# Patient Record
Sex: Male | Born: 1955 | Race: Black or African American | Hispanic: No | Marital: Single | Smoking: Never smoker
Health system: Southern US, Community
[De-identification: ages and names within clinical notes are randomized; demographics above are authoritative.]

## PROBLEM LIST (undated history)

## (undated) DIAGNOSIS — I10 Essential (primary) hypertension: Secondary | ICD-10-CM

## (undated) DIAGNOSIS — I219 Acute myocardial infarction, unspecified: Secondary | ICD-10-CM

## (undated) HISTORY — DX: Acute myocardial infarction, unspecified: I21.9

---

## 2014-05-29 ENCOUNTER — Encounter (INDEPENDENT_AMBULATORY_CARE_PROVIDER_SITE_OTHER): Payer: Self-pay | Admitting: *Deleted

## 2014-06-14 ENCOUNTER — Encounter (INDEPENDENT_AMBULATORY_CARE_PROVIDER_SITE_OTHER): Payer: Self-pay | Admitting: Internal Medicine

## 2014-06-14 ENCOUNTER — Other Ambulatory Visit (INDEPENDENT_AMBULATORY_CARE_PROVIDER_SITE_OTHER): Payer: Self-pay | Admitting: *Deleted

## 2014-06-14 ENCOUNTER — Telehealth (INDEPENDENT_AMBULATORY_CARE_PROVIDER_SITE_OTHER): Payer: Self-pay | Admitting: *Deleted

## 2014-06-14 ENCOUNTER — Ambulatory Visit (INDEPENDENT_AMBULATORY_CARE_PROVIDER_SITE_OTHER): Payer: BC Managed Care – PPO | Admitting: Internal Medicine

## 2014-06-14 ENCOUNTER — Encounter (HOSPITAL_COMMUNITY): Payer: Self-pay | Admitting: Pharmacy Technician

## 2014-06-14 VITALS — BP 156/90 | HR 64 | Temp 97.8°F | Ht 68.0 in | Wt 178.2 lb

## 2014-06-14 DIAGNOSIS — K625 Hemorrhage of anus and rectum: Secondary | ICD-10-CM

## 2014-06-14 DIAGNOSIS — Z1211 Encounter for screening for malignant neoplasm of colon: Secondary | ICD-10-CM

## 2014-06-14 DIAGNOSIS — K649 Unspecified hemorrhoids: Secondary | ICD-10-CM | POA: Insufficient documentation

## 2014-06-14 LAB — CBC WITH DIFFERENTIAL/PLATELET
Basophils Absolute: 0.1 10*3/uL (ref 0.0–0.1)
Basophils Relative: 1 % (ref 0–1)
EOS ABS: 0.1 10*3/uL (ref 0.0–0.7)
Eosinophils Relative: 2 % (ref 0–5)
HCT: 44.2 % (ref 39.0–52.0)
HEMOGLOBIN: 14.8 g/dL (ref 13.0–17.0)
LYMPHS ABS: 1.7 10*3/uL (ref 0.7–4.0)
Lymphocytes Relative: 33 % (ref 12–46)
MCH: 24 pg — AB (ref 26.0–34.0)
MCHC: 33.5 g/dL (ref 30.0–36.0)
MCV: 71.8 fL — ABNORMAL LOW (ref 78.0–100.0)
MONOS PCT: 8 % (ref 3–12)
Monocytes Absolute: 0.4 10*3/uL (ref 0.1–1.0)
NEUTROS ABS: 2.8 10*3/uL (ref 1.7–7.7)
Neutrophils Relative %: 56 % (ref 43–77)
Platelets: 169 10*3/uL (ref 150–400)
RBC: 6.16 MIL/uL — AB (ref 4.22–5.81)
RDW: 15.5 % (ref 11.5–15.5)
WBC: 5 10*3/uL (ref 4.0–10.5)

## 2014-06-14 NOTE — Telephone Encounter (Signed)
Patient needs movi prep 

## 2014-06-14 NOTE — Progress Notes (Signed)
   Subjective:    Patient ID: Craig Mccoy, male    DOB: 12-25-1955, 58 y.o.   MRN: [id number]  HPI Referred to our office by Dr. Wende Neighbors for abdominal pain/screening colonoscopy. Presents today states he eats hot pepper, he will pass blood from his rectum. Has not seen any blood in 2 weeks. He usually has a BM daily. Stools are brown in color. Normal caliber. Sometimes his stools are loose. He does have some burping. Appetite is good. No weight loss.  He has never undergone a colonoscopy in the past. He states he feels good. He has frequent flatus. He is avoiding spicy foods now. Denies having heart burn.  No family hx of colon cancer.    Review of Systems     Past Medical History  Diagnosis Date  . MI (myocardial infarction)     History reviewed. No pertinent past surgical history.  No Known Allergies  No current outpatient prescriptions on file prior to visit.   No current facility-administered medications on file prior to visit.     Objective:   Physical Exam Filed Vitals:   06/14/14 0958  BP: 156/90  Pulse: 64  Temp: 97.8 F (36.6 C)  Height: 5\' 8"  (1.727 m)  Weight: 178 lb 3.2 oz (80.831 kg)   Alert and oriented. Skin warm and dry. Oral mucosa is moist.   . Sclera anicteric, conjunctivae is pink. Thyroid not enlarged. No cervical lymphadenopathy. Lungs clear. Heart regular rate and rhythm.  Abdomen is soft. Bowel sounds are positive. No hepatomegaly. No abdominal masses felt. No tenderness.  No edema to lower extremities. Stool brown and guaiac negative.       Assessment & Plan:  Rectal bleeding: Colonic neoplasm, Polyp, hemorrhoids needs to be ruled out. Patient has never undergone a colonoscopy in the past.

## 2014-06-14 NOTE — Patient Instructions (Signed)
Colonoscopy.  The risks and benefits such as perforation, bleeding, and infection were reviewed with the patient and is agreeable. 

## 2014-06-15 MED ORDER — PEG-KCL-NACL-NASULF-NA ASC-C 100 G PO SOLR: 1.0000 | Freq: Once | ORAL | Status: DC

## 2014-06-22 ENCOUNTER — Ambulatory Visit (HOSPITAL_COMMUNITY)
Admission: RE | Admit: 2014-06-22 | Discharge: 2014-06-22 | Disposition: A | Discharge status: Home / Self Care | Payer: BC Managed Care – PPO | Source: Ambulatory Visit | Attending: Internal Medicine | Admitting: Internal Medicine

## 2014-06-22 ENCOUNTER — Encounter (HOSPITAL_COMMUNITY)
Admission: RE | Disposition: A | Discharge status: Home / Self Care | Payer: Self-pay | Source: Ambulatory Visit | Attending: Internal Medicine

## 2014-06-22 ENCOUNTER — Encounter (HOSPITAL_COMMUNITY): Payer: Self-pay | Admitting: *Deleted

## 2014-06-22 DIAGNOSIS — K648 Other hemorrhoids: Secondary | ICD-10-CM | POA: Diagnosis not present

## 2014-06-22 DIAGNOSIS — D125 Benign neoplasm of sigmoid colon: Secondary | ICD-10-CM | POA: Insufficient documentation

## 2014-06-22 DIAGNOSIS — K649 Unspecified hemorrhoids: Secondary | ICD-10-CM

## 2014-06-22 DIAGNOSIS — K644 Residual hemorrhoidal skin tags: Secondary | ICD-10-CM | POA: Insufficient documentation

## 2014-06-22 DIAGNOSIS — K5730 Diverticulosis of large intestine without perforation or abscess without bleeding: Secondary | ICD-10-CM | POA: Diagnosis not present

## 2014-06-22 DIAGNOSIS — I10 Essential (primary) hypertension: Secondary | ICD-10-CM | POA: Diagnosis not present

## 2014-06-22 DIAGNOSIS — K921 Melena: Secondary | ICD-10-CM | POA: Diagnosis present

## 2014-06-22 DIAGNOSIS — K625 Hemorrhage of anus and rectum: Secondary | ICD-10-CM

## 2014-06-22 DIAGNOSIS — I252 Old myocardial infarction: Secondary | ICD-10-CM | POA: Insufficient documentation

## 2014-06-22 HISTORY — DX: Essential (primary) hypertension: I10

## 2014-06-22 HISTORY — PX: COLONOSCOPY: SHX5424

## 2014-06-22 SURGERY — COLONOSCOPY
Anesthesia: Moderate Sedation

## 2014-06-22 MED ORDER — MIDAZOLAM HCL 5 MG/5ML IJ SOLN
INTRAMUSCULAR | Status: DC | PRN
  Administered 2014-06-22 (×2): 2 mg via INTRAVENOUS
  Administered 2014-06-22: 3 mg via INTRAVENOUS

## 2014-06-22 MED ORDER — ENALAPRILAT 1.25 MG/ML IV SOLN
INTRAVENOUS | Status: AC
  Filled 2014-06-22: qty 2

## 2014-06-22 MED ORDER — METOPROLOL TARTRATE 1 MG/ML IV SOLN
INTRAVENOUS | Status: DC | PRN
  Administered 2014-06-22 (×2): 2.5 mg via INTRAVENOUS

## 2014-06-22 MED ORDER — MEPERIDINE HCL 50 MG/ML IJ SOLN
INTRAMUSCULAR | Status: DC | PRN
  Administered 2014-06-22 (×2): 25 mg via INTRAVENOUS

## 2014-06-22 MED ORDER — SODIUM CHLORIDE 0.9 % IV SOLN
INTRAVENOUS | Status: DC
  Administered 2014-06-22: 1000 mL via INTRAVENOUS

## 2014-06-22 MED ORDER — STERILE WATER FOR IRRIGATION IR SOLN
Status: DC | PRN
  Administered 2014-06-22: 14:00:00

## 2014-06-22 MED ORDER — ENALAPRILAT 1.25 MG/ML IV SOLN
1.2500 mg | Freq: Once | INTRAVENOUS | Status: AC
  Administered 2014-06-22: 1.25 mg via INTRAVENOUS

## 2014-06-22 MED ORDER — MEPERIDINE HCL 50 MG/ML IJ SOLN
INTRAMUSCULAR | Status: AC
  Filled 2014-06-22: qty 1

## 2014-06-22 MED ORDER — ATENOLOL 50 MG PO TABS: 50.0000 mg | ORAL_TABLET | Freq: Every day | ORAL | Status: DC

## 2014-06-22 MED ORDER — MIDAZOLAM HCL 5 MG/5ML IJ SOLN
INTRAMUSCULAR | Status: AC
  Filled 2014-06-22: qty 10

## 2014-06-22 MED ORDER — HYDROCORTISONE ACETATE 25 MG RE SUPP
25.0000 mg | Freq: Every day | RECTAL | Status: DC
Start: 2014-06-22 — End: 2019-07-11

## 2014-06-22 MED ORDER — METOPROLOL TARTRATE 1 MG/ML IV SOLN
INTRAVENOUS | Status: AC
Start: 2014-06-22 — End: 2014-06-23
  Filled 2014-06-22: qty 5

## 2014-06-22 NOTE — Discharge Instructions (Signed)
Resume usual medications and low-salt high-fiber diet. Atenolol 50 mg by mouth daily. Anusol HC suppository one per rectum daily at bedtime for 2 weeks No driving for 24 hours. Follow-up with Dr. Delphina Cahill early next week for further treatment of high blood pressure. Physician will call with results of biopsy     Low-Sodium Eating Plan Sodium raises blood pressure and causes water to be held in the body. Getting less sodium from food will help lower your blood pressure, reduce any swelling, and protect your heart, liver, and kidneys. We get sodium by adding salt (sodium chloride) to food. Most of our sodium comes from canned, boxed, and frozen foods. Restaurant foods, fast foods, and pizza are also very high in sodium. Even if you take medicine to lower your blood pressure or to reduce fluid in your body, getting less sodium from your food is important. WHAT IS MY PLAN? Most people should limit their sodium intake to 2,300 mg a day. Your health care provider recommends that you limit your sodium intake to __________ a day.  WHAT DO I NEED TO KNOW ABOUT THIS EATING PLAN? For the low-sodium eating plan, you will follow these general guidelines:  Choose foods with a % Daily Value for sodium of less than 5% (as listed on the food label).   Use salt-free seasonings or herbs instead of table salt or sea salt.   Check with your health care provider or pharmacist before using salt substitutes.   Eat fresh foods.  Eat more vegetables and fruits.  Limit canned vegetables. If you do use them, rinse them well to decrease the sodium.   Limit cheese to 1 oz (28 g) per day.   Eat lower-sodium products, often labeled as "lower sodium" or "no salt added."  Avoid foods that contain monosodium glutamate (MSG). MSG is sometimes added to [place] food and some canned foods.  Check food labels (Nutrition Facts labels) on foods to learn how much sodium is in one serving.  Eat more home-cooked  food and less restaurant, buffet, and fast food.  When eating at a restaurant, ask that your food be prepared with less salt or none, if possible.  HOW DO I READ FOOD LABELS FOR SODIUM INFORMATION? The Nutrition Facts label lists the amount of sodium in one serving of the food. If you eat more than one serving, you must multiply the listed amount of sodium by the number of servings. Food labels may also identify foods as:  Sodium free--Less than 5 mg in a serving.  Very low sodium--35 mg or less in a serving.  Low sodium--140 mg or less in a serving.  Light in sodium--50% less sodium in a serving. For example, if a food that usually has 300 mg of sodium is changed to become light in sodium, it will have 150 mg of sodium.  Reduced sodium--25% less sodium in a serving. For example, if a food that usually has 400 mg of sodium is changed to reduced sodium, it will have 300 mg of sodium. WHAT FOODS CAN I EAT? Grains Low-sodium cereals, including oats, puffed wheat and rice, and shredded wheat cereals. Low-sodium crackers. Unsalted rice and pasta. Lower-sodium bread.  Vegetables Frozen or fresh vegetables. Low-sodium or reduced-sodium canned vegetables. Low-sodium or reduced-sodium tomato sauce and paste. Low-sodium or reduced-sodium tomato and vegetable juices.  Fruits Fresh, frozen, and canned fruit. Fruit juice.  Meat and Other Protein Products Low-sodium canned tuna and salmon. Fresh or frozen meat, poultry, seafood, and fish. Lamb.  Unsalted nuts. Dried beans, peas, and lentils without added salt. Unsalted canned beans. Homemade soups without salt. Eggs.  Dairy Milk. Soy milk. Ricotta cheese. Low-sodium or reduced-sodium cheeses. Yogurt.  Condiments Fresh and dried herbs and spices. Salt-free seasonings. Onion and garlic powders. Low-sodium varieties of mustard and ketchup. Lemon juice.  Fats and Oils Reduced-sodium salad dressings. Unsalted butter.  Other Unsalted popcorn  and pretzels.  The items listed above may not be a complete list of recommended foods or beverages. Contact your dietitian for more options. WHAT FOODS ARE NOT RECOMMENDED? Grains Instant hot cereals. Bread stuffing, pancake, and biscuit mixes. Croutons. Seasoned rice or pasta mixes. Noodle soup cups. Boxed or frozen macaroni and cheese. Self-rising flour. Regular salted crackers. Vegetables Regular canned vegetables. Regular canned tomato sauce and paste. Regular tomato and vegetable juices. Frozen vegetables in sauces. Salted french fries. Olives. Craig Mccoy. Relishes. Sauerkraut. Salsa. Meat and Other Protein Products Salted, canned, smoked, spiced, or pickled meats, seafood, or fish. Bacon, ham, sausage, hot dogs, corned beef, chipped beef, and packaged luncheon meats. Salt pork. Jerky. Pickled herring. Anchovies, regular canned tuna, and sardines. Salted nuts. Dairy Processed cheese and cheese spreads. Cheese curds. Blue cheese and cottage cheese. Buttermilk.  Condiments Onion and garlic salt, seasoned salt, table salt, and sea salt. Canned and packaged gravies. Worcestershire sauce. Tartar sauce. Barbecue sauce. Teriyaki sauce. Soy sauce, including reduced sodium. Steak sauce. Fish sauce. Oyster sauce. Cocktail sauce. Horseradish. Regular ketchup and mustard. Meat flavorings and tenderizers. Bouillon cubes. Hot sauce. Tabasco sauce. Marinades. Taco seasonings. Relishes. Fats and Oils Regular salad dressings. Salted butter. Margarine. Ghee. Bacon fat.  Other Potato and tortilla chips. Corn chips and puffs. Salted popcorn and pretzels. Canned or dried soups. Pizza. Frozen entrees and pot pies.  The items listed above may not be a complete list of foods and beverages to avoid. Contact your dietitian for more information. Document Released: 01/30/2002 Document Revised: 08/15/2013 Document Reviewed: 06/14/2013 [facility] Patient Information 2015 [place], [place]. This information is not  intended to replace advice given to you by your health care provider. Make sure you discuss any questions you have with your health care provider.     High-Fiber Diet Fiber is found in fruits, vegetables, and grains. A high-fiber diet encourages the addition of more whole grains, legumes, fruits, and vegetables in your diet. The recommended amount of fiber for adult males is 38 g per day. For adult females, it is 25 g per day. Pregnant and lactating women should get 28 g of fiber per day. If you have a digestive or bowel problem, ask your caregiver for advice before adding high-fiber foods to your diet. Eat a variety of high-fiber foods instead of only a select few type of foods.  PURPOSE  To increase stool bulk.  To make bowel movements more regular to prevent constipation.  To lower cholesterol.  To prevent overeating. WHEN IS THIS DIET USED?  It may be used if you have constipation and hemorrhoids.  It may be used if you have uncomplicated diverticulosis (intestine condition) and irritable bowel syndrome.  It may be used if you need help with weight management.  It may be used if you want to add it to your diet as a protective measure against atherosclerosis, diabetes, and cancer. SOURCES OF FIBER  Whole-grain breads and cereals.  Fruits, such as apples, oranges, bananas, berries, prunes, and pears.  Vegetables, such as green peas, carrots, sweet potatoes, beets, broccoli, cabbage, spinach, and artichokes.  Legumes, such split peas, soy,  lentils.  Almonds. FIBER CONTENT IN FOODS Starches and Grains / Dietary Fiber (g)  Cheerios, 1 cup / 3 g  Corn Flakes cereal, 1 cup / 0.7 g  Rice crispy treat cereal, 1 cup / 0.3 g  Instant oatmeal (cooked),  cup / 2 g  Frosted wheat cereal, 1 cup / 5.1 g  Brown, long-grain rice (cooked), 1 cup / 3.5 g  White, long-grain rice (cooked), 1 cup / 0.6 g  Enriched macaroni (cooked), 1 cup / 2.5 g Legumes / Dietary Fiber  (g)  Baked beans (canned, plain, or vegetarian),  cup / 5.2 g  Kidney beans (canned),  cup / 6.8 g  Pinto beans (cooked),  cup / 5.5 g Breads and Crackers / Dietary Fiber (g)  Plain or honey graham crackers, 2 squares / 0.7 g  Saltine crackers, 3 squares / 0.3 g  Plain, salted pretzels, 10 pieces / 1.8 g  Whole-wheat bread, 1 slice / 1.9 g  White bread, 1 slice / 0.7 g  Raisin bread, 1 slice / 1.2 g  Plain bagel, 3 oz / 2 g  Flour tortilla, 1 oz / 0.9 g  Corn tortilla, 1 small / 1.5 g  Hamburger or hotdog bun, 1 small / 0.9 g Fruits / Dietary Fiber (g)  Apple with skin, 1 medium / 4.4 g  Sweetened applesauce,  cup / 1.5 g  Banana,  medium / 1.5 g  Grapes, 10 grapes / 0.4 g  Orange, 1 small / 2.3 g  Raisin, 1.5 oz / 1.6 g  Melon, 1 cup / 1.4 g Vegetables / Dietary Fiber (g)  Green beans (canned),  cup / 1.3 g  Carrots (cooked),  cup / 2.3 g  Broccoli (cooked),  cup / 2.8 g  Peas (cooked),  cup / 4.4 g  Mashed potatoes,  cup / 1.6 g  Lettuce, 1 cup / 0.5 g  Corn (canned),  cup / 1.6 g  Tomato,  cup / 1.1 g Document Released: 08/10/2005 Document Revised: 02/09/2012 Document Reviewed: 11/12/2011 [employer] Patient Information 2015 [place], [place]. This information is not intended to replace advice given to you by your health care provider. Make sure you discuss any questions you have with your health care provider.

## 2014-06-22 NOTE — Progress Notes (Addendum)
Spoke to patient's girlfriend about appointment with Dr. Nevada Crane to check blood pressure. Tried to get appointment for patient, but doctor's office closed when I called. [place] Scales, girlfriend, stated that they had Dr. Juel Burrow number and would contact him on Monday. Also, stated that patient had blood pressure medication prescription filled and would began taking tonight. Verbalized understanding. No further questions asked or addressed.

## 2014-06-22 NOTE — H&P (Signed)
Craig Mccoy is an 58 y.o. male.   Chief Complaint: Patient is here for colonoscopy. HPI: Patient is 58 year old African-American male who presents with intermittent hematochezia. He states it started when he was eating spicy foods but lately he has not had any problem. Bleeding always occurs with bowel movements as fresh blood. He denies diarrhea constipation or abdominal pain. Family history is negative for CRC. 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  Past Medical History  Diagnosis Date  . MI (myocardial infarction)   . Hypertension     History reviewed. No pertinent past surgical history.  History reviewed. No pertinent family history. Social History:  reports that he has never smoked. He does not have any smokeless tobacco history on file. He reports that he drinks alcohol. He reports that he uses illicit drugs.  Allergies: No Known Allergies  Medications Prior to Admission  Medication Sig Dispense Refill  . ibuprofen (ADVIL,MOTRIN) 200 MG tablet Take 200 mg by mouth every 6 (six) hours as needed.      . peg 3350 powder (MOVIPREP) 100 G SOLR Take 1 kit (200 g total) by mouth once.  1 kit  0    No results found for this or any previous visit (from the past 48 hour(s)). No results found.  ROS  Blood pressure 195/107, pulse 98, temperature 98.1 F (36.7 C), temperature source Oral, resp. rate 30, SpO2 99.00%. Physical Exam  Constitutional: He appears well-developed and well-nourished.  HENT:  Mouth/Throat: Oropharynx is clear and moist.  Eyes: Conjunctivae are normal. No scleral icterus.  Neck: No thyromegaly present.  Cardiovascular: Normal rate, regular rhythm and normal heart sounds.   No murmur heard. Respiratory: Effort normal and breath sounds normal.  GI: Soft. He exhibits no distension and no mass. There is no tenderness.  Musculoskeletal: He exhibits no edema.   Lymphadenopathy:    He has no cervical adenopathy.  Neurological: He is alert.  Skin: Skin is warm and dry.     Assessment/Plan Hematochezia. Diagnostic colonoscopy.  CraigNAJEEB Mccoy 06/22/2014, 1:45 PM

## 2014-06-22 NOTE — Progress Notes (Signed)
Pt came into pre-op for colonoscopy with Dr. Laural Golden. During pre-op pt's vitals showed a BP of 212/129, HR 111 in the right arm and BP of 201/118, HR 108 in the left arm. Pt denied blurry vision, seeing spots, and/or headache. Dr. Laural Golden notified. Dr. Laural Golden gave order to give Enalapril 1.25mg  IV. Order was initiated. Pt and his family were advised to call PCP this afternoon after procedure to make office visit concerning his BP. Will continue to monitor pt.

## 2014-06-22 NOTE — Op Note (Addendum)
COLONOSCOPY PROCEDURE REPORT  PATIENT:  Craig Mccoy  MR#:  [id number] Birthdate:  Sep 26, 1955, 58 y.o., male Endoscopist:  Dr. Rogene Houston, MD Referred By:  Dr. Thedore Mins call, MD  Procedure Date: 06/22/2014  Procedure:   Colonoscopy  Indications: Patient is 58 year old African American male was been having intermittent hematochezia. He is undergoing diagnostic colonoscopy. Family history is negative for CRC.   Informed Consent:  The procedure and risks were reviewed with the patient and informed consent was obtained.  Medications:  Demerol 50 mg IV Versed 7 mg IV Metoprolol 5 mg IV. Enalapril 0.25 mg IV  Description of procedure:  After a digital rectal exam was performed, that colonoscope was advanced from the anus through the rectum and colon to the area of the cecum, ileocecal valve and appendiceal orifice. The cecum was deeply intubated. These structures were well-seen and photographed for the record. From the level of the cecum and ileocecal valve, the scope was slowly and cautiously withdrawn. The mucosal surfaces were carefully surveyed utilizing scope tip to flexion to facilitate fold flattening as needed. The scope was pulled down into the rectum where a thorough exam including retroflexion was performed.  Findings:   Prep excellent. Scattered diverticula noted throughout the colon. Small polyp ablated by cold biopsy from proximal sigmoid colon. Normal rectal mucosa. Prominent hemorrhoids noted above and below the dentate line with erythema.   Therapeutic/Diagnostic Maneuvers Performed:  See above  Complications:  None   Cecal Withdrawal Time:  14 minutes  Impression:  Examination performed to cecum. Few scattered diverticula throughout the colon. Small polyp ablated by cold biopsy from proximal sigmoid colon. Inflamed internal and external hemorrhoids.  Comment; Recurrent hematochezia felt to be secondary to hemorr  Recommendations:  Standard instructions  given. High-fiber diet. Anusol HC suppository 1 per rectum daily at bedtime. Atenolol 50 mg by mouth daily. Issue advised to follow-up with Dr. Nevada Crane next week for further treatment of hypertension. I will contact patient with biopsy results and further recommendations.  REHMAN,NAJEEB U  06/22/2014 2:27 PM  CC: Dr. Delphina Cahill, MD & Dr. Rayne Du ref. provider found

## 2014-07-11 ENCOUNTER — Encounter (INDEPENDENT_AMBULATORY_CARE_PROVIDER_SITE_OTHER): Payer: Self-pay | Admitting: *Deleted

## 2014-10-06 ENCOUNTER — Emergency Department (HOSPITAL_COMMUNITY)
Admission: EM | Admit: 2014-10-06 | Discharge: 2014-10-06 | Disposition: A | Discharge status: Home / Self Care | Payer: BLUE CROSS/BLUE SHIELD | Attending: Emergency Medicine | Admitting: Emergency Medicine

## 2014-10-06 ENCOUNTER — Encounter (HOSPITAL_COMMUNITY): Payer: Self-pay | Admitting: *Deleted

## 2014-10-06 DIAGNOSIS — K047 Periapical abscess without sinus: Secondary | ICD-10-CM | POA: Insufficient documentation

## 2014-10-06 DIAGNOSIS — I252 Old myocardial infarction: Secondary | ICD-10-CM | POA: Diagnosis not present

## 2014-10-06 DIAGNOSIS — R220 Localized swelling, mass and lump, head: Secondary | ICD-10-CM | POA: Diagnosis present

## 2014-10-06 DIAGNOSIS — Z7952 Long term (current) use of systemic steroids: Secondary | ICD-10-CM | POA: Insufficient documentation

## 2014-10-06 DIAGNOSIS — Z79899 Other long term (current) drug therapy: Secondary | ICD-10-CM | POA: Diagnosis not present

## 2014-10-06 DIAGNOSIS — I10 Essential (primary) hypertension: Secondary | ICD-10-CM | POA: Diagnosis not present

## 2014-10-06 MED ORDER — HYDROCODONE-ACETAMINOPHEN 5-325 MG PO TABS: 1.0000 | ORAL_TABLET | Freq: Four times a day (QID) | ORAL | Status: DC | PRN

## 2014-10-06 MED ORDER — PENICILLIN V POTASSIUM 500 MG PO TABS: 500.0000 mg | ORAL_TABLET | Freq: Four times a day (QID) | ORAL | Status: AC

## 2014-10-06 NOTE — Discharge Instructions (Signed)
Dental Abscess A dental abscess is a collection of infected fluid (pus) from a bacterial infection in the inner part of the tooth (pulp). It usually occurs at the end of the tooth's root.  CAUSES   Severe tooth decay.  Trauma to the tooth that allows bacteria to enter into the pulp, such as a broken or chipped tooth. SYMPTOMS   Severe pain in and around the infected tooth.  Swelling and redness around the abscessed tooth or in the mouth or face.  Tenderness.  Pus drainage.  Bad breath.  Bitter taste in the mouth.  Difficulty swallowing.  Difficulty opening the mouth.  Nausea.  Vomiting.  Chills.  Swollen neck glands. DIAGNOSIS   A medical and dental history will be taken.  An examination will be performed by tapping on the abscessed tooth.  X-rays may be taken of the tooth to identify the abscess. TREATMENT The goal of treatment is to eliminate the infection. You may be prescribed antibiotic medicine to stop the infection from spreading. A root canal may be performed to save the tooth. If the tooth cannot be saved, it may be pulled (extracted) and the abscess may be drained.  HOME CARE INSTRUCTIONS  Only take over-the-counter or prescription medicines for pain, fever, or discomfort as directed by your caregiver.  Rinse your mouth (gargle) often with salt water ( tsp salt in 8 oz [250 ml] of warm water) to relieve pain or swelling.  Do not drive after taking pain medicine (narcotics).  Do not apply heat to the outside of your face.  Return to your dentist for further treatment as directed. SEEK MEDICAL CARE IF:  Your pain is not helped by medicine.  Your pain is getting worse instead of better. SEEK IMMEDIATE MEDICAL CARE IF:  You have a fever or persistent symptoms for more than 2-3 days.  You have a fever and your symptoms suddenly get worse.  You have chills or a very bad headache.  You have problems breathing or swallowing.  You have trouble  opening your mouth.  You have swelling in the neck or around the eye. Document Released: 08/10/2005 Document Revised: 05/04/2012 Document Reviewed: 11/18/2010 [facility] Patient Information 2015 [place], [place]. This information is not intended to replace advice given to you by your health care provider. Make sure you discuss any questions you have with your health care provider.    Emergency Department Resource Guide 1) Find a Doctor and Pay Out of Pocket Although you won't have to find out who is covered by your insurance plan, it is a good idea to ask around and get recommendations. You will then need to call the office and see if the doctor you have chosen will accept you as a new patient and what types of options they offer for patients who are self-pay. Some doctors offer discounts or will set up payment plans for their patients who do not have insurance, but you will need to ask so you aren't surprised when you get to your appointment.  2) Contact Your Local Health Department Not all health departments have doctors that can see patients for sick visits, but many do, so it is worth a call to see if yours does. If you don't know where your local health department is, you can check in your phone book. The CDC also has a tool to help you locate your state's health department, and many state websites also have listings of all of their local health departments.  3) Find a  Walk-in Clinic If your illness is not likely to be very severe or complicated, you may want to try a walk in clinic. These are popping up all over the country in pharmacies, drugstores, and shopping centers. They're usually staffed by nurse practitioners or physician assistants that have been trained to treat common illnesses and complaints. They're usually fairly quick and inexpensive. However, if you have serious medical issues or chronic medical problems, these are probably not your best option.  No Primary Care Doctor: - Call  Health Connect at  [phone] - they can help you locate a primary care doctor that  accepts your insurance, provides certain services, etc. - Physician Referral Service- [phone]  Chronic Pain Problems: Organization         Address  Phone   Notes  [facility]  [phone] Patients need to be referred by their primary care doctor.   Medication Assistance: Organization         Address  Phone   Notes  [facility] Medication [facility] [address] [place], [place], [place] [address] [phone] --Must be a resident of [facility] -- Must have NO insurance coverage whatsoever (no Medicaid/ Medicare, etc.) -- The pt. MUST have a primary care doctor that directs their care regularly and follows them in the community   [facility]  [phone]   [employer]  [phone]    Agencies that provide inexpensive medical care: Organization         Address  Phone   Notes  [place]  [phone]   [facility]    [phone]   [facility], [place] [address] [phone]   [place] [address], [place] [phone]   [facility]    [phone]   [facility]    [phone]   [place] and [address], [place] Phone:  [phone], Fax:  [phone] Hours of Operation:  9 am - 6 pm, M-F.  Also accepts Medicaid/Medicare and self-pay.  [facility] for [place], [address], [place] Phone: [phone], Fax: [phone]. Hours of Operation:  8:30 am - 5:30 pm, M-F.  Also accepts Medicaid and self-pay.  [facility] [place] [address], [place] Phone: [phone]   [place], [place], [place] [phone], Ext. [phone] Mondays & Thursdays: 7-9 AM.  First 15 patients are seen on a first come, first serve  basis.    [place] Providers:  Organization         Address  Phone   Notes  [facility] [address], [place] [phone] Also accepts self-pay patients.  [facility] [address], [place]  [phone]   [place], [address], [place] [phone]   [facility] [address], [place] [phone]   Lucianne Lei [address], [place]   [phone] Only accepts [place] Access [place] patients after they have their name applied to their card.   Self-Pay (no insurance) in [facility]:  Organization         Address  Phone   Notes  Sickle Cell Patients, [facility] [place],  [place] [phone]   [facility] [place] [phone]   [facility] [place]  [place], [place], [place] [phone]   [facility]/Dr. Osei-Bonsu  [address], [place] or [address], [place] [phone] Phone number for both [place] and [place] locations is the same.  [facility] [address] [place] [phone]   [facility] [address] [place] or [address] [phone]   [facility] [address] [place] [phone], phone; [phone], fax Sees patients 1st and 3rd Saturday of every month.  Must not qualify for public or private insurance (i.e. Medicaid, Medicare, [place] Health Choice, Veterans' Benefits)  Household income should be no more than 200% of the poverty level The clinic cannot treat you if you are pregnant or think you are pregnant  Sexually transmitted diseases are not treated at the clinic.    Dental Care: Organization         Address  Phone  Notes  [facility] Department of [facility] [place] [phone] Accepts children up to age 32 who are enrolled in [place] or [place]; pregnant women with a Medicaid card; and children who have applied for Medicaid or [place] Health Choice, but were declined, whose parents can pay a reduced fee at time of service.  [facility] Department of [facility]  [address], [place] [phone] Accepts children up to age 22 who are enrolled in [place] or [place]; pregnant women with a Medicaid card; and children who have applied for Medicaid or [place] Health Choice, but were declined, whose parents can pay a reduced fee at time of service.  [place] [facility]  [place] [phone] Patients are seen by appointment only. Walk-ins are not accepted. [place] will see patients 77 years of age and older. Monday - Tuesday (8am-5pm) Most Wednesdays (8:30-5pm) $30 per visit, cash only  [facility] Adult Dental Access PROGRAM  [address], [facility] [phone] Patients are seen by appointment only. Walk-ins are not accepted. [place] will see patients 11 years of age and older. One Wednesday Evening (Monthly: Volunteer Based).  $30 per visit, cash only  [place]  [phone] for adults; Children under age 99, call Graduate Pediatric Dentistry at [phone]. Children aged 50-14, please call [phone] to request a pediatric application.  Dental services are provided in all areas of dental care including fillings, crowns and bridges, complete and partial dentures, implants, gum treatment, root canals, and extractions. Preventive care is also provided. Treatment is provided to both adults and children. Patients are selected via a lottery and there is often a waiting list.   [facility] [address] [place]  [phone]  [email or web address]   [facility] [address] [place], [place] [phone], Ext. [phone] Second and Fourth Thursday of each month, opens at 6:30 AM; Clinic ends at 9 AM.  Patients are seen on a first-come first-served basis, and a limited number are seen during each clinic.

## 2014-10-06 NOTE — ED Provider Notes (Signed)
CSN: [id number]     Arrival date & time 10/06/14  1514 History   First MD Initiated Contact with Patient 10/06/14 1529     Chief Complaint  Patient presents with  . Facial Swelling      The history is provided by the patient.   Pt states intermittent dental pain "for a long time" with swelling to the face which was first noticed last night.  Past Medical History  Diagnosis Date  . MI (myocardial infarction)   . Hypertension    Past Surgical History  Procedure Laterality Date  . Colonoscopy N/A 06/22/2014    Procedure: COLONOSCOPY;  Surgeon: Rogene Houston, MD;  [facility];  Service: Endoscopy;  Laterality: N/A;  1230 - moved to 2:15 - Ann to notify   No family history on file. History  Substance Use Topics  . Smoking status: Never Smoker   . Smokeless tobacco: Not on file  . Alcohol Use: Yes     Comment: ? 4-5 beers  a day    Review of Systems  All other systems reviewed and are negative.     Allergies  Review of patient's allergies indicates no known allergies.  Home Medications   Prior to Admission medications   Medication Sig Start Date End Date Taking? Authorizing Provider  atenolol (TENORMIN) 50 MG tablet Take 1 tablet (50 mg total) by mouth daily. 06/22/14   Rogene Houston, MD  HYDROcodone-acetaminophen (NORCO) 5-325 MG per tablet Take 1 tablet by mouth every 6 (six) hours as needed for moderate pain. 10/06/14   Dot Lanes, MD  hydrocortisone (ANUSOL-HC) 25 MG suppository Place 1 suppository (25 mg total) rectally at bedtime. 06/22/14   Rogene Houston, MD  ibuprofen (ADVIL,MOTRIN) 200 MG tablet Take 200 mg by mouth every 6 (six) hours as needed.    Historical Provider, MD  penicillin v potassium (VEETID) 500 MG tablet Take 1 tablet (500 mg total) by mouth 4 (four) times daily. 10/06/14 10/13/14  Dot Lanes, MD   BP 188/110 mmHg  Pulse 101  Temp(Src) 99.3 F (37.4 C) (Oral)  Resp 24  Ht 5\' 7"  (1.702 m)  Wt 175 lb (79.379 kg)  BMI  27.40 kg/m2  SpO2 99% Physical Exam  Constitutional: He is oriented to person, place, and time. He appears well-developed and well-nourished. No distress.  HENT:  Head: Normocephalic and atraumatic.    Mouth/Throat:    No drainable abscess seen or palpated  Eyes: Pupils are equal, round, and reactive to light.  Neck: Normal range of motion.  Cardiovascular: Normal rate and intact distal pulses.   Pulmonary/Chest: No respiratory distress.  Abdominal: Normal appearance. He exhibits no distension.  Musculoskeletal: Normal range of motion.  Neurological: He is alert and oriented to person, place, and time. No cranial nerve deficit.  Skin: Skin is warm and dry. No rash noted.  Psychiatric: He has a normal mood and affect. His behavior is normal.  Nursing note and vitals reviewed.   ED Course  Procedures (including critical care time) Labs Review Labs Reviewed - No data to display  Imaging Review No results found.    MDM   Final diagnoses:  Tooth abscess        Dot Lanes, MD 10/06/14 1540

## 2014-10-06 NOTE — ED Notes (Signed)
Pt states intermittent dental pain "for a long time" with swelling to the face which was first noticed last night.

## 2015-11-26 DIAGNOSIS — Z125 Encounter for screening for malignant neoplasm of prostate: Secondary | ICD-10-CM | POA: Diagnosis not present

## 2015-11-26 DIAGNOSIS — E785 Hyperlipidemia, unspecified: Secondary | ICD-10-CM | POA: Diagnosis not present

## 2015-12-05 DIAGNOSIS — D509 Iron deficiency anemia, unspecified: Secondary | ICD-10-CM | POA: Diagnosis not present

## 2015-12-05 DIAGNOSIS — I10 Essential (primary) hypertension: Secondary | ICD-10-CM | POA: Diagnosis not present

## 2016-06-09 DIAGNOSIS — I10 Essential (primary) hypertension: Secondary | ICD-10-CM | POA: Diagnosis not present

## 2016-11-27 DIAGNOSIS — I10 Essential (primary) hypertension: Secondary | ICD-10-CM | POA: Diagnosis not present

## 2016-11-27 DIAGNOSIS — D509 Iron deficiency anemia, unspecified: Secondary | ICD-10-CM | POA: Diagnosis not present

## 2016-12-02 DIAGNOSIS — D509 Iron deficiency anemia, unspecified: Secondary | ICD-10-CM | POA: Diagnosis not present

## 2016-12-02 DIAGNOSIS — I10 Essential (primary) hypertension: Secondary | ICD-10-CM | POA: Diagnosis not present

## 2016-12-02 DIAGNOSIS — R944 Abnormal results of kidney function studies: Secondary | ICD-10-CM | POA: Diagnosis not present

## 2016-12-02 DIAGNOSIS — H6120 Impacted cerumen, unspecified ear: Secondary | ICD-10-CM | POA: Diagnosis not present

## 2016-12-02 DIAGNOSIS — F1010 Alcohol abuse, uncomplicated: Secondary | ICD-10-CM | POA: Diagnosis not present

## 2016-12-25 ENCOUNTER — Encounter (HOSPITAL_COMMUNITY): Payer: Self-pay | Admitting: *Deleted

## 2016-12-25 ENCOUNTER — Emergency Department (HOSPITAL_COMMUNITY): Payer: BLUE CROSS/BLUE SHIELD

## 2016-12-25 ENCOUNTER — Emergency Department (HOSPITAL_COMMUNITY)
Admission: EM | Admit: 2016-12-25 | Discharge: 2016-12-25 | Disposition: A | Discharge status: Home / Self Care | Payer: BLUE CROSS/BLUE SHIELD | Attending: Emergency Medicine | Admitting: Emergency Medicine

## 2016-12-25 DIAGNOSIS — Y999 Unspecified external cause status: Secondary | ICD-10-CM | POA: Insufficient documentation

## 2016-12-25 DIAGNOSIS — S99922A Unspecified injury of left foot, initial encounter: Secondary | ICD-10-CM | POA: Diagnosis not present

## 2016-12-25 DIAGNOSIS — M10072 Idiopathic gout, left ankle and foot: Secondary | ICD-10-CM | POA: Diagnosis not present

## 2016-12-25 DIAGNOSIS — I10 Essential (primary) hypertension: Secondary | ICD-10-CM | POA: Diagnosis not present

## 2016-12-25 DIAGNOSIS — W228XXA Striking against or struck by other objects, initial encounter: Secondary | ICD-10-CM | POA: Diagnosis not present

## 2016-12-25 DIAGNOSIS — Y929 Unspecified place or not applicable: Secondary | ICD-10-CM | POA: Insufficient documentation

## 2016-12-25 DIAGNOSIS — T1490XA Injury, unspecified, initial encounter: Secondary | ICD-10-CM

## 2016-12-25 DIAGNOSIS — Z79899 Other long term (current) drug therapy: Secondary | ICD-10-CM | POA: Insufficient documentation

## 2016-12-25 DIAGNOSIS — I252 Old myocardial infarction: Secondary | ICD-10-CM | POA: Insufficient documentation

## 2016-12-25 DIAGNOSIS — M79675 Pain in left toe(s): Secondary | ICD-10-CM | POA: Diagnosis not present

## 2016-12-25 DIAGNOSIS — M1000 Idiopathic gout, unspecified site: Secondary | ICD-10-CM

## 2016-12-25 DIAGNOSIS — Y939 Activity, unspecified: Secondary | ICD-10-CM | POA: Insufficient documentation

## 2016-12-25 LAB — URIC ACID: URIC ACID, SERUM: 8.3 mg/dL — AB (ref 4.4–7.6)

## 2016-12-25 MED ORDER — HYDROCODONE-ACETAMINOPHEN 5-325 MG PO TABS: 1.0000 | ORAL_TABLET | ORAL | 0 refills | Status: DC | PRN

## 2016-12-25 MED ORDER — IBUPROFEN 800 MG PO TABS
800.0000 mg | ORAL_TABLET | Freq: Once | ORAL | Status: AC
  Administered 2016-12-25: 800 mg via ORAL
  Filled 2016-12-25: qty 1

## 2016-12-25 MED ORDER — IBUPROFEN 600 MG PO TABS: 600.0000 mg | ORAL_TABLET | Freq: Four times a day (QID) | ORAL | 0 refills | Status: DC

## 2016-12-25 MED ORDER — DEXAMETHASONE 4 MG PO TABS: 4.0000 mg | ORAL_TABLET | Freq: Two times a day (BID) | ORAL | 0 refills | Status: DC

## 2016-12-25 MED ORDER — ACETAMINOPHEN 325 MG PO TABS
650.0000 mg | ORAL_TABLET | Freq: Once | ORAL | Status: AC
  Administered 2016-12-25: 650 mg via ORAL
  Filled 2016-12-25: qty 2

## 2016-12-25 NOTE — Discharge Instructions (Signed)
The x-ray of your toe is negative for fracture. There are some arthritis changes noted. Your uric acid is elevated. I am concerned that your pain is related to gout. Please keep your foot warm as much as possible. Use Decadron 2 times daily with food. Use ibuprofen with breakfast, lunch, dinner, and at bedtime. May use Norco for more severe pain. Norco may cause drowsiness, please do not drive, drink alcohol, operate machinery, or participate in activities requiring concentration when taking this medication. Please see Dr. Nevada Crane for additional management next week.

## 2016-12-25 NOTE — ED Provider Notes (Signed)
[facility] DEPT Provider Note   CSN: [id number] Arrival date & time: 12/25/16  4627     History   Chief Complaint Chief Complaint  Patient presents with  . Toe Injury    HPI Craig Mccoy is a 61 y.o. male.  Patient is a 61 year old male who presents to the emergency department with complaint of left big toe pain.  The patient states that 3 days ago he stopped his big toe walking in the dark. He noticed some discomfort, but went on to work and his activities of daily living. He noticed that he got more and more swelling and more and more pain. On last night he was standing on a concrete floor during his work and he states that he could not take the pain any longer. He came to the emergency department for evaluation. He has not had any previous operations or procedures involving the left toe or left lower extremity. He denies history of gout. No other injury reported. He is tried Tylenol and some warm soaks, but states this is not helped.      Past Medical History:  Diagnosis Date  . Hypertension   . MI (myocardial infarction) [facility])     Patient Active Problem List   Diagnosis Date Noted  . Rectal bleeding 06/14/2014    Past Surgical History:  Procedure Laterality Date  . COLONOSCOPY N/A 06/22/2014   Procedure: COLONOSCOPY;  Surgeon: Rogene Houston, MD;  Location[facility];  Service: Endoscopy;  Laterality: N/A;  1230 - moved to 2:15 - Ann to notify       Home Medications    Prior to Admission medications   Medication Sig Start Date End Date Taking? Authorizing Provider  atenolol (TENORMIN) 50 MG tablet Take 1 tablet (50 mg total) by mouth daily. 06/22/14  Yes Rogene Houston, MD  ibuprofen (ADVIL,MOTRIN) 200 MG tablet Take 200 mg by mouth every 6 (six) hours as needed.   Yes Historical Provider, MD  HYDROcodone-acetaminophen (NORCO) 5-325 MG per tablet Take 1 tablet by mouth every 6 (six) hours as needed for moderate pain. Patient not taking: Reported on  12/25/2016 10/06/14   Leonard Schwartz, MD  hydrocortisone (ANUSOL-HC) 25 MG suppository Place 1 suppository (25 mg total) rectally at bedtime. Patient not taking: Reported on 12/25/2016 06/22/14   Rogene Houston, MD    Family History No family history on file.  Social History Social History  Substance Use Topics  . Smoking status: Never Smoker  . Smokeless tobacco: Never Used  . Alcohol use Yes     Comment: ? 4-5 beers  a day     Allergies   Patient has no known allergies.   Review of Systems Review of Systems  Constitutional: Negative for activity change.       All ROS Neg except as noted in HPI  HENT: Negative for nosebleeds.   Eyes: Negative for photophobia and discharge.  Respiratory: Negative for cough, shortness of breath and wheezing.   Cardiovascular: Negative for chest pain and palpitations.  Gastrointestinal: Negative for abdominal pain and blood in stool.  Genitourinary: Negative for dysuria, frequency and hematuria.  Musculoskeletal: Positive for arthralgias. Negative for back pain and neck pain.  Skin: Negative.   Neurological: Negative for dizziness, seizures and speech difficulty.  Psychiatric/Behavioral: Negative for confusion and hallucinations.     Physical Exam Updated Vital Signs BP (!) 149/84 (BP Location: Left Arm)   Pulse 78   Temp 98.8 F (37.1 C) (Oral)  Resp 18   Ht 5\' 8"  (1.727 m)   Wt 81.6 kg   SpO2 97%   BMI 27.37 kg/m   Physical Exam  Constitutional: He is oriented to person, place, and time. He appears well-developed and well-nourished.  Non-toxic appearance.  HENT:  Head: Normocephalic.  Right Ear: Tympanic membrane and external ear normal.  Left Ear: Tympanic membrane and external ear normal.  Eyes: EOM and lids are normal. Pupils are equal, round, and reactive to light.  Neck: Normal range of motion. Neck supple. Carotid bruit is not present.  Cardiovascular: Normal rate, regular rhythm, normal heart sounds, intact distal pulses  and normal pulses.   Pulmonary/Chest: Breath sounds normal. No respiratory distress.  Abdominal: Soft. Bowel sounds are normal. There is no tenderness. There is no guarding.  Musculoskeletal: Normal range of motion.       Left foot: There is tenderness and swelling.  Swelling and pain of the left big toe. No red streaks. No evidence for foreign body.  Lymphadenopathy:       Head (right side): No submandibular adenopathy present.       Head (left side): No submandibular adenopathy present.    He has no cervical adenopathy.  Neurological: He is alert and oriented to person, place, and time. He has normal strength. No cranial nerve deficit or sensory deficit.  Skin: Skin is warm and dry.  Psychiatric: He has a normal mood and affect. His speech is normal.  Nursing note and vitals reviewed.    ED Treatments / Results  Labs (all labs ordered are listed, but only abnormal results are displayed) Labs Reviewed - No data to display  EKG  EKG Interpretation None       Radiology No results found.  Procedures Procedures (including critical care time)  Medications Ordered in ED Medications  ibuprofen (ADVIL,MOTRIN) tablet 800 mg (not administered)  acetaminophen (TYLENOL) tablet 650 mg (not administered)     Initial Impression / Assessment and Plan / ED Course  I have reviewed the triage vital signs and the nursing notes.  Pertinent labs & imaging results that were available during my care of the patient were reviewed by me and considered in my medical decision making (see chart for details).     *I have reviewed nursing notes, vital signs, and all appropriate lab and imaging results for this patient.**  Final Clinical Impressions(s) / ED Diagnoses MDM Vital signs within normal limits. Patient sustained an injury to the big toe a few days ago. X-ray of the left big toe is negative for fracture or dislocation. There are some osteoarthritis changes present. Uric acid is  elevated. Suspect that this is a gout attack. Patient will be treated with steroids, anti-inflammatory pain medicine, and narcotic pain medicine. I've asked the patient to see Dr. [place] Crane for evaluation and for maintenance next week. Questions were answered. The patient is in agreement with this plan.    Final diagnoses:  Acute idiopathic gout, unspecified site    New Prescriptions New Prescriptions   DEXAMETHASONE (DECADRON) 4 MG TABLET    Take 1 tablet (4 mg total) by mouth 2 (two) times daily with a meal.   HYDROCODONE-ACETAMINOPHEN (NORCO/VICODIN) 5-325 MG TABLET    Take 1 tablet by mouth every 4 (four) hours as needed.   IBUPROFEN (ADVIL,MOTRIN) 600 MG TABLET    Take 1 tablet (600 mg total) by mouth 4 (four) times daily.     Lily Kocher, PA-C 12/25/16 1214    Varney Biles, MD  12/27/16 0912  

## 2016-12-25 NOTE — ED Triage Notes (Signed)
Pt states he was walking in the dark on Tuesday and stumped his big toe. Pt having pain since then

## 2017-01-05 DIAGNOSIS — M1000 Idiopathic gout, unspecified site: Secondary | ICD-10-CM | POA: Diagnosis not present

## 2017-01-05 DIAGNOSIS — Z6827 Body mass index (BMI) 27.0-27.9, adult: Secondary | ICD-10-CM | POA: Diagnosis not present

## 2017-01-05 DIAGNOSIS — M109 Gout, unspecified: Secondary | ICD-10-CM | POA: Diagnosis not present

## 2017-06-03 DIAGNOSIS — I10 Essential (primary) hypertension: Secondary | ICD-10-CM | POA: Diagnosis not present

## 2017-06-03 DIAGNOSIS — E782 Mixed hyperlipidemia: Secondary | ICD-10-CM | POA: Diagnosis not present

## 2017-06-07 DIAGNOSIS — D509 Iron deficiency anemia, unspecified: Secondary | ICD-10-CM | POA: Diagnosis not present

## 2017-06-07 DIAGNOSIS — F1010 Alcohol abuse, uncomplicated: Secondary | ICD-10-CM | POA: Diagnosis not present

## 2017-06-07 DIAGNOSIS — R944 Abnormal results of kidney function studies: Secondary | ICD-10-CM | POA: Diagnosis not present

## 2017-06-07 DIAGNOSIS — I10 Essential (primary) hypertension: Secondary | ICD-10-CM | POA: Diagnosis not present

## 2017-11-23 DIAGNOSIS — D509 Iron deficiency anemia, unspecified: Secondary | ICD-10-CM | POA: Diagnosis not present

## 2017-11-23 DIAGNOSIS — E782 Mixed hyperlipidemia: Secondary | ICD-10-CM | POA: Diagnosis not present

## 2017-11-23 DIAGNOSIS — I10 Essential (primary) hypertension: Secondary | ICD-10-CM | POA: Diagnosis not present

## 2017-12-02 DIAGNOSIS — I10 Essential (primary) hypertension: Secondary | ICD-10-CM | POA: Diagnosis not present

## 2017-12-02 DIAGNOSIS — R944 Abnormal results of kidney function studies: Secondary | ICD-10-CM | POA: Diagnosis not present

## 2018-01-13 DIAGNOSIS — N529 Male erectile dysfunction, unspecified: Secondary | ICD-10-CM | POA: Diagnosis not present

## 2018-01-13 DIAGNOSIS — Z6827 Body mass index (BMI) 27.0-27.9, adult: Secondary | ICD-10-CM | POA: Diagnosis not present

## 2018-01-13 DIAGNOSIS — I10 Essential (primary) hypertension: Secondary | ICD-10-CM | POA: Diagnosis not present

## 2018-02-17 ENCOUNTER — Encounter (HOSPITAL_COMMUNITY): Payer: Self-pay | Admitting: Emergency Medicine

## 2018-02-17 ENCOUNTER — Other Ambulatory Visit: Payer: Self-pay

## 2018-02-17 ENCOUNTER — Emergency Department (HOSPITAL_COMMUNITY)
Admission: EM | Admit: 2018-02-17 | Discharge: 2018-02-17 | Disposition: A | Discharge status: Home / Self Care | Payer: BLUE CROSS/BLUE SHIELD | Attending: Emergency Medicine | Admitting: Emergency Medicine

## 2018-02-17 DIAGNOSIS — I10 Essential (primary) hypertension: Secondary | ICD-10-CM | POA: Insufficient documentation

## 2018-02-17 DIAGNOSIS — M109 Gout, unspecified: Secondary | ICD-10-CM | POA: Diagnosis not present

## 2018-02-17 DIAGNOSIS — M25522 Pain in left elbow: Secondary | ICD-10-CM | POA: Diagnosis present

## 2018-02-17 DIAGNOSIS — Z79899 Other long term (current) drug therapy: Secondary | ICD-10-CM | POA: Insufficient documentation

## 2018-02-17 MED ORDER — PREDNISONE 10 MG PO TABS: ORAL_TABLET | ORAL | 0 refills | Status: DC

## 2018-02-17 MED ORDER — PREDNISONE 50 MG PO TABS
60.0000 mg | ORAL_TABLET | Freq: Once | ORAL | Status: AC
  Administered 2018-02-17: 60 mg via ORAL
  Filled 2018-02-17: qty 1

## 2018-02-17 MED ORDER — HYDROCODONE-ACETAMINOPHEN 5-325 MG PO TABS: 1.0000 | ORAL_TABLET | ORAL | 0 refills | Status: DC | PRN

## 2018-02-17 NOTE — ED Triage Notes (Signed)
Patient c/o left elbow pain with swelling. Per patient started Friday night and progressively getting worse. Denies any injury. Per patient has gout in which this feels similar. Denies having any medication to take for gout.

## 2018-02-17 NOTE — Discharge Instructions (Signed)
Take your next dose of prednisone tomorrow (you will take your 2nd 60 mg strength dose tomorrow morning, as your received your 1st dose today).  Follow the label, every other day you will take one less tablet until you have tapered off this medicine on day 12.  You may take the hydrocodone prescribed for pain relief.  This will make you drowsy - do not drive within 4 hours of taking this medication. Warm compresses can also help speed the  resolution of this gout flare.

## 2018-02-17 NOTE — ED Provider Notes (Signed)
[facility] EMERGENCY DEPARTMENT Provider Note   CSN: [id number] Arrival date & time: 02/17/18  0841     History   Chief Complaint Chief Complaint  Patient presents with  . Elbow Pain    HPI Craig Mccoy is a 62 y.o. male presenting with a 5-day history of progressive worsening pain, swelling and redness at his left elbow which she states is consistent with a gouty flare.  His pain is worsened with movement and with light palpation at the site.  He denies injury to the elbow, also denies any recent illnesses, no fevers or other myalgias.  He denies IV drug use.  His was initially diagnosed with gout 1 year ago with a gouty flare in his left great toe.  He has had no problems with this condition until this episode.  He has had no medications prior to arrival for this pain.  The history is provided by the patient.    Past Medical History:  Diagnosis Date  . Hypertension   . MI (myocardial infarction) [facility])     Patient Active Problem List   Diagnosis Date Noted  . Rectal bleeding 06/14/2014    Past Surgical History:  Procedure Laterality Date  . COLONOSCOPY N/A 06/22/2014   Procedure: COLONOSCOPY;  Surgeon: Rogene Houston, MD;  Location: [facility] ENDO SUITE;  Service: Endoscopy;  Laterality: N/A;  1230 - moved to 2:15 - Ann to notify        Home Medications    Prior to Admission medications   Medication Sig Start Date End Date Taking? Authorizing Provider  atenolol (TENORMIN) 50 MG tablet Take 1 tablet (50 mg total) by mouth daily. 06/22/14   Rogene Houston, MD  dexamethasone (DECADRON) 4 MG tablet Take 1 tablet (4 mg total) by mouth 2 (two) times daily with a meal. 12/25/16   Lily Kocher, PA-C  HYDROcodone-acetaminophen (NORCO/VICODIN) 5-325 MG tablet Take 1 tablet by mouth every 4 (four) hours as needed. 02/17/18   Evalee [facility], PA-C  hydrocortisone (ANUSOL-HC) 25 MG suppository Place 1 suppository (25 mg total) rectally at bedtime. Patient not taking: Reported on  12/25/2016 06/22/14   Rogene Houston, MD  ibuprofen (ADVIL,MOTRIN) 600 MG tablet Take 1 tablet (600 mg total) by mouth 4 (four) times daily. 12/25/16   Lily Kocher, PA-C  predniSONE (DELTASONE) 10 MG tablet Take 6 tabs daily by mouth for 1 day,  Then 5 tabs daily for 2 days,  4 tabs daily for 2 days,  3 tabs daily for 2 days,  2 tabs daily for 2 days,  Then 1 tab daily for 2 days. 02/17/18   Evalee [facility], PA-C    Family History No family history on file.  Social History Social History   Tobacco Use  . Smoking status: Never Smoker  . Smokeless tobacco: Never Used  Substance Use Topics  . Alcohol use: Yes    Comment: 4-5 beers  a day  . 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   Review of Systems Review of Systems  Constitutional: Negative for fever.  Musculoskeletal: Positive for arthralgias and joint swelling. Negative for myalgias.  Neurological: Negative for weakness and numbness.     Physical Exam Updated Vital Signs BP (!) 145/95 (BP Location: Right Arm)   Pulse 87   Temp 99.2 F (37.3 C) (Oral)   Resp 18   Ht 5\' 8"  (1.727  m)   Wt 80.7 kg (178 lb)   SpO2 98%   BMI 27.06 kg/m   Physical Exam  Constitutional: He appears well-developed and well-nourished.  HENT:  Head: Atraumatic.  Neck: Normal range of motion.  Cardiovascular:  Pulses equal bilaterally  Musculoskeletal: He exhibits edema and tenderness.       Left elbow: He exhibits decreased range of motion and swelling.  Entirety of the left elbow is tender to palpation.  There is mild edema, there is also mild erythema, the skin is warm but not hot.  There is no red streaking.  He is tender to even mild pressure against his skin at this site.  There are no wounds, no punctures.  Hand and wrist are nontender, shoulder also nontender.  Neurological: He is alert. He has normal strength. He displays normal reflexes. No sensory deficit.   Skin: Skin is warm and dry.  Psychiatric: He has a normal mood and affect.     ED Treatments / Results  Labs (all labs ordered are listed, but only abnormal results are displayed) Labs Reviewed - No data to display  EKG None  Radiology No results found.  Procedures Procedures (including critical care time)  Medications Ordered in ED Medications  predniSONE (DELTASONE) tablet 60 mg (has no administration in time range)     Initial Impression / Assessment and Plan / ED Course  I have reviewed the triage vital signs and the nursing notes.  Pertinent labs & imaging results that were available during my care of the patient were reviewed by me and considered in my medical decision making (see chart for details).     Patient with edema, increased warmth and pain in his left elbow consistent with gouty flare.  He has no risk factors for joint infection.  His vital signs are normal today.  He was placed on prednisone, hydrocodone.  Advised recheck by his PCP or return here if his symptoms worsen or persist beyond the next 2 days despite today's treatment.  Discussed warm compresses, elevation.  Final Clinical Impressions(s) / ED Diagnoses   Final diagnoses:  Acute gout of left elbow, unspecified cause    ED Discharge Orders        Ordered    predniSONE (DELTASONE) 10 MG tablet     02/17/18 0912    HYDROcodone-acetaminophen (NORCO/VICODIN) 5-325 MG tablet  Every 4 hours PRN     02/17/18 0912       Evalee [facility], PA-C 02/17/18 0920    Long, Wonda [facility], MD 02/17/18 1313

## 2018-03-01 DIAGNOSIS — Z6827 Body mass index (BMI) 27.0-27.9, adult: Secondary | ICD-10-CM | POA: Diagnosis not present

## 2018-03-01 DIAGNOSIS — M7020 Olecranon bursitis, unspecified elbow: Secondary | ICD-10-CM | POA: Diagnosis not present

## 2018-06-21 DIAGNOSIS — R7301 Impaired fasting glucose: Secondary | ICD-10-CM | POA: Diagnosis not present

## 2018-06-21 DIAGNOSIS — R944 Abnormal results of kidney function studies: Secondary | ICD-10-CM | POA: Diagnosis not present

## 2018-06-21 DIAGNOSIS — I10 Essential (primary) hypertension: Secondary | ICD-10-CM | POA: Diagnosis not present

## 2018-06-21 DIAGNOSIS — Z6827 Body mass index (BMI) 27.0-27.9, adult: Secondary | ICD-10-CM | POA: Diagnosis not present

## 2018-06-30 DIAGNOSIS — I10 Essential (primary) hypertension: Secondary | ICD-10-CM | POA: Diagnosis not present

## 2018-06-30 DIAGNOSIS — N182 Chronic kidney disease, stage 2 (mild): Secondary | ICD-10-CM | POA: Diagnosis not present

## 2018-06-30 DIAGNOSIS — E1122 Type 2 diabetes mellitus with diabetic chronic kidney disease: Secondary | ICD-10-CM | POA: Diagnosis not present

## 2018-06-30 DIAGNOSIS — D509 Iron deficiency anemia, unspecified: Secondary | ICD-10-CM | POA: Diagnosis not present

## 2018-11-17 DIAGNOSIS — R944 Abnormal results of kidney function studies: Secondary | ICD-10-CM | POA: Diagnosis not present

## 2018-11-17 DIAGNOSIS — Z125 Encounter for screening for malignant neoplasm of prostate: Secondary | ICD-10-CM | POA: Diagnosis not present

## 2018-11-17 DIAGNOSIS — I10 Essential (primary) hypertension: Secondary | ICD-10-CM | POA: Diagnosis not present

## 2018-11-17 DIAGNOSIS — E1122 Type 2 diabetes mellitus with diabetic chronic kidney disease: Secondary | ICD-10-CM | POA: Diagnosis not present

## 2018-11-22 DIAGNOSIS — E1122 Type 2 diabetes mellitus with diabetic chronic kidney disease: Secondary | ICD-10-CM | POA: Diagnosis not present

## 2018-11-22 DIAGNOSIS — D509 Iron deficiency anemia, unspecified: Secondary | ICD-10-CM | POA: Diagnosis not present

## 2018-11-22 DIAGNOSIS — I10 Essential (primary) hypertension: Secondary | ICD-10-CM | POA: Diagnosis not present

## 2018-11-22 DIAGNOSIS — N182 Chronic kidney disease, stage 2 (mild): Secondary | ICD-10-CM | POA: Diagnosis not present

## 2019-03-14 DIAGNOSIS — E782 Mixed hyperlipidemia: Secondary | ICD-10-CM | POA: Diagnosis not present

## 2019-03-14 DIAGNOSIS — E1122 Type 2 diabetes mellitus with diabetic chronic kidney disease: Secondary | ICD-10-CM | POA: Diagnosis not present

## 2019-03-14 DIAGNOSIS — D509 Iron deficiency anemia, unspecified: Secondary | ICD-10-CM | POA: Diagnosis not present

## 2019-03-14 DIAGNOSIS — I10 Essential (primary) hypertension: Secondary | ICD-10-CM | POA: Diagnosis not present

## 2019-03-22 DIAGNOSIS — E1122 Type 2 diabetes mellitus with diabetic chronic kidney disease: Secondary | ICD-10-CM | POA: Diagnosis not present

## 2019-03-22 DIAGNOSIS — I10 Essential (primary) hypertension: Secondary | ICD-10-CM | POA: Diagnosis not present

## 2019-03-22 DIAGNOSIS — E782 Mixed hyperlipidemia: Secondary | ICD-10-CM | POA: Diagnosis not present

## 2019-03-22 DIAGNOSIS — N182 Chronic kidney disease, stage 2 (mild): Secondary | ICD-10-CM | POA: Diagnosis not present

## 2019-06-15 DIAGNOSIS — E119 Type 2 diabetes mellitus without complications: Secondary | ICD-10-CM | POA: Diagnosis not present

## 2019-06-29 DIAGNOSIS — E782 Mixed hyperlipidemia: Secondary | ICD-10-CM | POA: Diagnosis not present

## 2019-06-29 DIAGNOSIS — E1122 Type 2 diabetes mellitus with diabetic chronic kidney disease: Secondary | ICD-10-CM | POA: Diagnosis not present

## 2019-06-29 DIAGNOSIS — D509 Iron deficiency anemia, unspecified: Secondary | ICD-10-CM | POA: Diagnosis not present

## 2019-06-29 DIAGNOSIS — I10 Essential (primary) hypertension: Secondary | ICD-10-CM | POA: Diagnosis not present

## 2019-07-04 DIAGNOSIS — E1122 Type 2 diabetes mellitus with diabetic chronic kidney disease: Secondary | ICD-10-CM | POA: Diagnosis not present

## 2019-07-04 DIAGNOSIS — E782 Mixed hyperlipidemia: Secondary | ICD-10-CM | POA: Diagnosis not present

## 2019-07-04 DIAGNOSIS — D72819 Decreased white blood cell count, unspecified: Secondary | ICD-10-CM | POA: Diagnosis not present

## 2019-07-04 DIAGNOSIS — N182 Chronic kidney disease, stage 2 (mild): Secondary | ICD-10-CM | POA: Diagnosis not present

## 2019-07-04 DIAGNOSIS — D509 Iron deficiency anemia, unspecified: Secondary | ICD-10-CM | POA: Diagnosis not present

## 2019-07-04 DIAGNOSIS — I10 Essential (primary) hypertension: Secondary | ICD-10-CM | POA: Diagnosis not present

## 2019-07-11 ENCOUNTER — Emergency Department (HOSPITAL_COMMUNITY): Payer: BC Managed Care – PPO

## 2019-07-11 ENCOUNTER — Other Ambulatory Visit: Payer: Self-pay

## 2019-07-11 ENCOUNTER — Encounter (HOSPITAL_COMMUNITY): Payer: Self-pay

## 2019-07-11 ENCOUNTER — Inpatient Hospital Stay (HOSPITAL_COMMUNITY)
Admission: EM | Admit: 2019-07-11 | Discharge: 2019-08-25 | DRG: 870 | Disposition: E | Payer: BC Managed Care – PPO | Attending: Internal Medicine | Admitting: Internal Medicine

## 2019-07-11 DIAGNOSIS — R6521 Severe sepsis with septic shock: Secondary | ICD-10-CM | POA: Diagnosis not present

## 2019-07-11 DIAGNOSIS — I5021 Acute systolic (congestive) heart failure: Secondary | ICD-10-CM | POA: Diagnosis present

## 2019-07-11 DIAGNOSIS — Z515 Encounter for palliative care: Secondary | ICD-10-CM | POA: Diagnosis not present

## 2019-07-11 DIAGNOSIS — J9600 Acute respiratory failure, unspecified whether with hypoxia or hypercapnia: Secondary | ICD-10-CM | POA: Diagnosis not present

## 2019-07-11 DIAGNOSIS — Z66 Do not resuscitate: Secondary | ICD-10-CM | POA: Diagnosis not present

## 2019-07-11 DIAGNOSIS — E8729 Other acidosis: Secondary | ICD-10-CM

## 2019-07-11 DIAGNOSIS — Y95 Nosocomial condition: Secondary | ICD-10-CM | POA: Diagnosis not present

## 2019-07-11 DIAGNOSIS — N2589 Other disorders resulting from impaired renal tubular function: Secondary | ICD-10-CM

## 2019-07-11 DIAGNOSIS — J80 Acute respiratory distress syndrome: Secondary | ICD-10-CM

## 2019-07-11 DIAGNOSIS — G931 Anoxic brain damage, not elsewhere classified: Secondary | ICD-10-CM | POA: Diagnosis not present

## 2019-07-11 DIAGNOSIS — R7303 Prediabetes: Secondary | ICD-10-CM | POA: Diagnosis present

## 2019-07-11 DIAGNOSIS — R569 Unspecified convulsions: Secondary | ICD-10-CM | POA: Diagnosis not present

## 2019-07-11 DIAGNOSIS — Z452 Encounter for adjustment and management of vascular access device: Secondary | ICD-10-CM

## 2019-07-11 DIAGNOSIS — Z7189 Other specified counseling: Secondary | ICD-10-CM | POA: Diagnosis not present

## 2019-07-11 DIAGNOSIS — J1282 Pneumonia due to coronavirus disease 2019: Secondary | ICD-10-CM | POA: Diagnosis present

## 2019-07-11 DIAGNOSIS — Z9911 Dependence on respirator [ventilator] status: Secondary | ICD-10-CM | POA: Diagnosis not present

## 2019-07-11 DIAGNOSIS — I110 Hypertensive heart disease with heart failure: Secondary | ICD-10-CM | POA: Diagnosis present

## 2019-07-11 DIAGNOSIS — R4182 Altered mental status, unspecified: Secondary | ICD-10-CM | POA: Diagnosis not present

## 2019-07-11 DIAGNOSIS — J9601 Acute respiratory failure with hypoxia: Secondary | ICD-10-CM | POA: Diagnosis present

## 2019-07-11 DIAGNOSIS — R918 Other nonspecific abnormal finding of lung field: Secondary | ICD-10-CM | POA: Diagnosis not present

## 2019-07-11 DIAGNOSIS — U071 COVID-19: Secondary | ICD-10-CM | POA: Diagnosis present

## 2019-07-11 DIAGNOSIS — E860 Dehydration: Secondary | ICD-10-CM | POA: Diagnosis present

## 2019-07-11 DIAGNOSIS — J156 Pneumonia due to other aerobic Gram-negative bacteria: Secondary | ICD-10-CM | POA: Diagnosis present

## 2019-07-11 DIAGNOSIS — R34 Anuria and oliguria: Secondary | ICD-10-CM | POA: Diagnosis not present

## 2019-07-11 DIAGNOSIS — R579 Shock, unspecified: Secondary | ICD-10-CM | POA: Diagnosis not present

## 2019-07-11 DIAGNOSIS — I10 Essential (primary) hypertension: Secondary | ICD-10-CM | POA: Diagnosis not present

## 2019-07-11 DIAGNOSIS — Z79899 Other long term (current) drug therapy: Secondary | ICD-10-CM

## 2019-07-11 DIAGNOSIS — T17590A Other foreign object in bronchus causing asphyxiation, initial encounter: Secondary | ICD-10-CM | POA: Diagnosis not present

## 2019-07-11 DIAGNOSIS — D6489 Other specified anemias: Secondary | ICD-10-CM | POA: Diagnosis not present

## 2019-07-11 DIAGNOSIS — R0602 Shortness of breath: Secondary | ICD-10-CM | POA: Diagnosis not present

## 2019-07-11 DIAGNOSIS — M7989 Other specified soft tissue disorders: Secondary | ICD-10-CM | POA: Diagnosis not present

## 2019-07-11 DIAGNOSIS — J95851 Ventilator associated pneumonia: Secondary | ICD-10-CM | POA: Diagnosis not present

## 2019-07-11 DIAGNOSIS — A4189 Other specified sepsis: Principal | ICD-10-CM | POA: Diagnosis present

## 2019-07-11 DIAGNOSIS — E870 Hyperosmolality and hypernatremia: Secondary | ICD-10-CM | POA: Diagnosis not present

## 2019-07-11 DIAGNOSIS — G253 Myoclonus: Secondary | ICD-10-CM | POA: Diagnosis not present

## 2019-07-11 DIAGNOSIS — E876 Hypokalemia: Secondary | ICD-10-CM | POA: Diagnosis not present

## 2019-07-11 DIAGNOSIS — N170 Acute kidney failure with tubular necrosis: Secondary | ICD-10-CM | POA: Diagnosis present

## 2019-07-11 DIAGNOSIS — A419 Sepsis, unspecified organism: Secondary | ICD-10-CM | POA: Diagnosis not present

## 2019-07-11 DIAGNOSIS — J9690 Respiratory failure, unspecified, unspecified whether with hypoxia or hypercapnia: Secondary | ICD-10-CM

## 2019-07-11 DIAGNOSIS — E785 Hyperlipidemia, unspecified: Secondary | ICD-10-CM | POA: Diagnosis present

## 2019-07-11 DIAGNOSIS — I252 Old myocardial infarction: Secondary | ICD-10-CM

## 2019-07-11 DIAGNOSIS — Z6832 Body mass index (BMI) 32.0-32.9, adult: Secondary | ICD-10-CM

## 2019-07-11 DIAGNOSIS — G9349 Other encephalopathy: Secondary | ICD-10-CM | POA: Diagnosis present

## 2019-07-11 DIAGNOSIS — E872 Acidosis: Secondary | ICD-10-CM | POA: Diagnosis not present

## 2019-07-11 DIAGNOSIS — I428 Other cardiomyopathies: Secondary | ICD-10-CM | POA: Diagnosis present

## 2019-07-11 DIAGNOSIS — T380X5A Adverse effect of glucocorticoids and synthetic analogues, initial encounter: Secondary | ICD-10-CM | POA: Diagnosis present

## 2019-07-11 DIAGNOSIS — E440 Moderate protein-calorie malnutrition: Secondary | ICD-10-CM | POA: Diagnosis not present

## 2019-07-11 DIAGNOSIS — Z4682 Encounter for fitting and adjustment of non-vascular catheter: Secondary | ICD-10-CM | POA: Diagnosis not present

## 2019-07-11 DIAGNOSIS — R404 Transient alteration of awareness: Secondary | ICD-10-CM | POA: Diagnosis not present

## 2019-07-11 DIAGNOSIS — Y848 Other medical procedures as the cause of abnormal reaction of the patient, or of later complication, without mention of misadventure at the time of the procedure: Secondary | ICD-10-CM | POA: Diagnosis not present

## 2019-07-11 DIAGNOSIS — R05 Cough: Secondary | ICD-10-CM | POA: Diagnosis not present

## 2019-07-11 DIAGNOSIS — E874 Mixed disorder of acid-base balance: Secondary | ICD-10-CM | POA: Diagnosis not present

## 2019-07-11 DIAGNOSIS — E875 Hyperkalemia: Secondary | ICD-10-CM | POA: Diagnosis not present

## 2019-07-11 DIAGNOSIS — G9340 Encephalopathy, unspecified: Secondary | ICD-10-CM | POA: Diagnosis not present

## 2019-07-11 DIAGNOSIS — I514 Myocarditis, unspecified: Secondary | ICD-10-CM | POA: Diagnosis not present

## 2019-07-11 DIAGNOSIS — R748 Abnormal levels of other serum enzymes: Secondary | ICD-10-CM | POA: Diagnosis present

## 2019-07-11 DIAGNOSIS — N179 Acute kidney failure, unspecified: Secondary | ICD-10-CM | POA: Diagnosis not present

## 2019-07-11 DIAGNOSIS — Z978 Presence of other specified devices: Secondary | ICD-10-CM

## 2019-07-11 DIAGNOSIS — J1289 Other viral pneumonia: Secondary | ICD-10-CM | POA: Diagnosis present

## 2019-07-11 DIAGNOSIS — Z9289 Personal history of other medical treatment: Secondary | ICD-10-CM

## 2019-07-11 DIAGNOSIS — J9811 Atelectasis: Secondary | ICD-10-CM | POA: Diagnosis not present

## 2019-07-11 DIAGNOSIS — Z4659 Encounter for fitting and adjustment of other gastrointestinal appliance and device: Secondary | ICD-10-CM

## 2019-07-11 DIAGNOSIS — Z8249 Family history of ischemic heart disease and other diseases of the circulatory system: Secondary | ICD-10-CM

## 2019-07-11 LAB — MAGNESIUM: Magnesium: 4.2 mg/dL — ABNORMAL HIGH (ref 1.7–2.4)

## 2019-07-11 LAB — POCT I-STAT 7, (LYTES, BLD GAS, ICA,H+H)
Acid-base deficit: 14 mmol/L — ABNORMAL HIGH (ref 0.0–2.0)
Bicarbonate: 13.8 mmol/L — ABNORMAL LOW (ref 20.0–28.0)
Calcium, Ion: 1.1 mmol/L — ABNORMAL LOW (ref 1.15–1.40)
HCT: 37 % — ABNORMAL LOW (ref 39.0–52.0)
Hemoglobin: 12.6 g/dL — ABNORMAL LOW (ref 13.0–17.0)
O2 Saturation: 99 %
Patient temperature: 98.6
Potassium: 4.7 mmol/L (ref 3.5–5.1)
Sodium: 141 mmol/L (ref 135–145)
TCO2: 15 mmol/L — ABNORMAL LOW (ref 22–32)
pCO2 arterial: 36.9 mmHg (ref 32.0–48.0)
pH, Arterial: 7.18 — CL (ref 7.350–7.450)
pO2, Arterial: 162 mmHg — ABNORMAL HIGH (ref 83.0–108.0)

## 2019-07-11 LAB — COMPREHENSIVE METABOLIC PANEL
ALT: 65 U/L — ABNORMAL HIGH (ref 0–44)
AST: 76 U/L — ABNORMAL HIGH (ref 15–41)
Albumin: 3.2 g/dL — ABNORMAL LOW (ref 3.5–5.0)
Alkaline Phosphatase: 73 U/L (ref 38–126)
Anion gap: >20 — ABNORMAL HIGH (ref 5–15)
BUN: 219 mg/dL — ABNORMAL HIGH (ref 8–23)
CO2: 13 mmol/L — ABNORMAL LOW (ref 22–32)
Calcium: 9.1 mg/dL (ref 8.9–10.3)
Chloride: 102 mmol/L (ref 98–111)
Creatinine, Ser: 14.63 mg/dL — ABNORMAL HIGH (ref 0.61–1.24)
GFR calc Af Amer: 4 mL/min — ABNORMAL LOW (ref >60)
GFR calc non Af Amer: 3 mL/min — ABNORMAL LOW (ref >60)
Glucose, Bld: 211 mg/dL — ABNORMAL HIGH (ref 70–99)
Potassium: 4.9 mmol/L (ref 3.5–5.1)
Sodium: 143 mmol/L (ref 135–145)
Total Bilirubin: 0.9 mg/dL (ref 0.3–1.2)
Total Protein: 8.8 g/dL — ABNORMAL HIGH (ref 6.5–8.1)

## 2019-07-11 LAB — CBC WITH DIFFERENTIAL/PLATELET
Abs Immature Granulocytes: 0.07 10*3/uL (ref 0.00–0.07)
Basophils Absolute: 0 10*3/uL (ref 0.0–0.1)
Basophils Relative: 0 %
Eosinophils Absolute: 0 10*3/uL (ref 0.0–0.5)
Eosinophils Relative: 0 %
HCT: 48.2 % (ref 39.0–52.0)
Hemoglobin: 15.3 g/dL (ref 13.0–17.0)
Immature Granulocytes: 1 %
Lymphocytes Relative: 6 %
Lymphs Abs: 0.6 10*3/uL — ABNORMAL LOW (ref 0.7–4.0)
MCH: 23.1 pg — ABNORMAL LOW (ref 26.0–34.0)
MCHC: 31.7 g/dL (ref 30.0–36.0)
MCV: 72.9 fL — ABNORMAL LOW (ref 80.0–100.0)
Monocytes Absolute: 0.4 10*3/uL (ref 0.1–1.0)
Monocytes Relative: 4 %
Neutro Abs: 8.4 10*3/uL — ABNORMAL HIGH (ref 1.7–7.7)
Neutrophils Relative %: 89 %
Platelets: 352 10*3/uL (ref 150–400)
RBC: 6.61 MIL/uL — ABNORMAL HIGH (ref 4.22–5.81)
RDW: 17.7 % — ABNORMAL HIGH (ref 11.5–15.5)
WBC: 9.5 10*3/uL (ref 4.0–10.5)
nRBC: 0 % (ref 0.0–0.2)

## 2019-07-11 LAB — BLOOD GAS, ARTERIAL
Acid-base deficit: 17.9 mmol/L — ABNORMAL HIGH (ref 0.0–2.0)
Bicarbonate: 12.1 mmol/L — ABNORMAL LOW (ref 20.0–28.0)
FIO2: 100
O2 Saturation: 90.3 %
Patient temperature: 37
pCO2 arterial: <19 mmHg — CL (ref 32.0–48.0)
pH, Arterial: 7.281 — ABNORMAL LOW (ref 7.350–7.450)
pO2, Arterial: 72.1 mmHg — ABNORMAL LOW (ref 83.0–108.0)

## 2019-07-11 LAB — FERRITIN: Ferritin: 2490 ng/mL — ABNORMAL HIGH (ref 24–336)

## 2019-07-11 LAB — LACTATE DEHYDROGENASE: LDH: 429 U/L — ABNORMAL HIGH (ref 98–192)

## 2019-07-11 LAB — TRIGLYCERIDES: Triglycerides: 397 mg/dL — ABNORMAL HIGH (ref <150)

## 2019-07-11 LAB — LACTIC ACID, PLASMA
Lactic Acid, Venous: 3.8 mmol/L (ref 0.5–1.9)
Lactic Acid, Venous: 3.7 mmol/L (ref 0.5–1.9)

## 2019-07-11 LAB — GLUCOSE, CAPILLARY: Glucose-Capillary: 261 mg/dL — ABNORMAL HIGH (ref 70–99)

## 2019-07-11 LAB — SARS CORONAVIRUS 2 BY RT PCR (HOSPITAL ORDER, PERFORMED IN ~~LOC~~ HOSPITAL LAB): SARS Coronavirus 2: POSITIVE — AB

## 2019-07-11 LAB — FIBRINOGEN: Fibrinogen: >800 mg/dL — ABNORMAL HIGH (ref 210–475)

## 2019-07-11 LAB — C-REACTIVE PROTEIN: CRP: 38 mg/dL — ABNORMAL HIGH (ref <1.0)

## 2019-07-11 LAB — CBG MONITORING, ED: Glucose-Capillary: 176 mg/dL — ABNORMAL HIGH (ref 70–99)

## 2019-07-11 LAB — D-DIMER, QUANTITATIVE: D-Dimer, Quant: 2.52 ug/mL-FEU — ABNORMAL HIGH (ref 0.00–0.50)

## 2019-07-11 LAB — PROCALCITONIN: Procalcitonin: 25.01 ng/mL

## 2019-07-11 LAB — AMMONIA: Ammonia: 30 umol/L (ref 9–35)

## 2019-07-11 MED ORDER — ROCURONIUM BROMIDE 100 MG/10ML IV SOLN
100.0000 mg | Freq: Once | INTRAVENOUS | Status: AC
  Administered 2019-07-11: 100 mg via INTRAVENOUS

## 2019-07-11 MED ORDER — DEXAMETHASONE SODIUM PHOSPHATE 10 MG/ML IJ SOLN
10.0000 mg | Freq: Once | INTRAMUSCULAR | Status: AC
  Administered 2019-07-11: 10 mg via INTRAVENOUS
  Filled 2019-07-11: qty 1

## 2019-07-11 MED ORDER — PROPOFOL 1000 MG/100ML IV EMUL
0.0000 ug/kg/min | INTRAVENOUS | Status: DC
  Administered 2019-07-11: 5 ug/kg/min via INTRAVENOUS
  Administered 2019-07-12: 25 ug/kg/min via INTRAVENOUS
  Administered 2019-07-12 (×2): 35 ug/kg/min via INTRAVENOUS
  Filled 2019-07-11 (×3): qty 100

## 2019-07-11 MED ORDER — ORAL CARE MOUTH RINSE
15.0000 mL | OROMUCOSAL | Status: DC
  Administered 2019-07-12 – 2019-07-28 (×162): 15 mL via OROMUCOSAL

## 2019-07-11 MED ORDER — SODIUM CHLORIDE 0.9 % IV SOLN
1.0000 g | Freq: Once | INTRAVENOUS | Status: DC
  Filled 2019-07-11: qty 10

## 2019-07-11 MED ORDER — SODIUM CHLORIDE 0.9 % IV SOLN
500.0000 mg | INTRAVENOUS | Status: AC
  Administered 2019-07-12 – 2019-07-15 (×4): 500 mg via INTRAVENOUS
  Filled 2019-07-11 (×4): qty 500

## 2019-07-11 MED ORDER — SODIUM CHLORIDE 0.9 % IV BOLUS
3000.0000 mL | Freq: Once | INTRAVENOUS | Status: AC
  Administered 2019-07-11: 3000 mL via INTRAVENOUS

## 2019-07-11 MED ORDER — PRISMASOL BGK 4/2.5 32-4-2.5 MEQ/L IV SOLN
INTRAVENOUS | Status: DC
  Administered 2019-07-12 (×4): via INTRAVENOUS_CENTRAL
  Filled 2019-07-11 (×15): qty 5000

## 2019-07-11 MED ORDER — LACTATED RINGERS IV BOLUS
2000.0000 mL | Freq: Once | INTRAVENOUS | Status: AC
  Administered 2019-07-11: 21:00:00 2000 mL via INTRAVENOUS

## 2019-07-11 MED ORDER — PRISMASOL BGK 4/2.5 32-4-2.5 MEQ/L REPLACEMENT SOLN
Status: DC
  Administered 2019-07-12 (×2): via INTRAVENOUS_CENTRAL
  Filled 2019-07-11 (×3): qty 5000

## 2019-07-11 MED ORDER — PANTOPRAZOLE SODIUM 40 MG IV SOLR
40.0000 mg | Freq: Every day | INTRAVENOUS | Status: DC
  Administered 2019-07-12 – 2019-07-14 (×4): 40 mg via INTRAVENOUS
  Filled 2019-07-11 (×4): qty 40

## 2019-07-11 MED ORDER — SODIUM CHLORIDE 0.9 % IV SOLN
250.0000 [IU]/h | INTRAVENOUS | Status: DC
  Administered 2019-07-12: 05:00:00 1000 [IU]/h via INTRAVENOUS_CENTRAL
  Administered 2019-07-12: 300 [IU]/h via INTRAVENOUS_CENTRAL
  Filled 2019-07-11 (×2): qty 2

## 2019-07-11 MED ORDER — INSULIN ASPART 100 UNIT/ML ~~LOC~~ SOLN: 1.0000 [IU] | SUBCUTANEOUS | Status: DC

## 2019-07-11 MED ORDER — SODIUM BICARBONATE 8.4 % IV SOLN
150.0000 meq | Freq: Once | INTRAVENOUS | Status: AC
  Administered 2019-07-12: 150 meq via INTRAVENOUS
  Filled 2019-07-11: qty 50

## 2019-07-11 MED ORDER — SODIUM CHLORIDE 0.9 % IV SOLN: 1.0000 g | INTRAVENOUS | Status: DC

## 2019-07-11 MED ORDER — HEPARIN BOLUS VIA INFUSION (CRRT)
1000.0000 [IU] | INTRAVENOUS | Status: DC | PRN
  Filled 2019-07-11: qty 1000

## 2019-07-11 MED ORDER — CHLORHEXIDINE GLUCONATE 0.12% ORAL RINSE (MEDLINE KIT)
15.0000 mL | Freq: Two times a day (BID) | OROMUCOSAL | Status: DC
  Administered 2019-07-12 – 2019-07-28 (×32): 15 mL via OROMUCOSAL

## 2019-07-11 MED ORDER — STERILE WATER FOR INJECTION IV SOLN
INTRAVENOUS | Status: DC
  Administered 2019-07-12: via INTRAVENOUS
  Filled 2019-07-11 (×2): qty 850

## 2019-07-11 MED ORDER — FENTANYL CITRATE (PF) 100 MCG/2ML IJ SOLN: 50.0000 ug | INTRAMUSCULAR | Status: DC | PRN

## 2019-07-11 MED ORDER — FENTANYL CITRATE (PF) 100 MCG/2ML IJ SOLN
50.0000 ug | INTRAMUSCULAR | Status: DC | PRN
  Administered 2019-07-14: 50 ug via INTRAVENOUS
  Administered 2019-07-14 – 2019-07-16 (×9): 100 ug via INTRAVENOUS
  Administered 2019-07-16: 150 ug via INTRAVENOUS
  Administered 2019-07-16 (×3): 100 ug via INTRAVENOUS
  Administered 2019-07-16 (×2): 200 ug via INTRAVENOUS
  Administered 2019-07-17 (×4): 100 ug via INTRAVENOUS
  Administered 2019-07-17: 200 ug via INTRAVENOUS
  Filled 2019-07-11: qty 2

## 2019-07-11 MED ORDER — DEXAMETHASONE SODIUM PHOSPHATE 10 MG/ML IJ SOLN
6.0000 mg | Freq: Every day | INTRAMUSCULAR | Status: AC
  Administered 2019-07-12 – 2019-07-21 (×10): 6 mg via INTRAVENOUS
  Filled 2019-07-11 (×10): qty 0.6

## 2019-07-11 MED ORDER — MIDAZOLAM HCL 2 MG/2ML IJ SOLN: 4.0000 mg | Freq: Once | INTRAMUSCULAR | Status: DC

## 2019-07-11 MED ORDER — SODIUM CHLORIDE 0.9 % IV SOLN
200.0000 mg | Freq: Once | INTRAVENOUS | Status: AC
  Administered 2019-07-12: 01:00:00 200 mg via INTRAVENOUS
  Filled 2019-07-11: qty 40

## 2019-07-11 MED ORDER — SODIUM BICARBONATE 8.4 % IV SOLN
50.0000 meq | Freq: Once | INTRAVENOUS | Status: AC
  Administered 2019-07-11: 50 meq via INTRAVENOUS

## 2019-07-11 MED ORDER — INSULIN ASPART 100 UNIT/ML ~~LOC~~ SOLN
0.0000 [IU] | SUBCUTANEOUS | Status: DC
  Administered 2019-07-12: 20:00:00 2 [IU] via SUBCUTANEOUS
  Administered 2019-07-12: 01:00:00 5 [IU] via SUBCUTANEOUS
  Administered 2019-07-12 (×3): 1 [IU] via SUBCUTANEOUS
  Administered 2019-07-13 (×3): 2 [IU] via SUBCUTANEOUS
  Administered 2019-07-13: 1 [IU] via SUBCUTANEOUS
  Administered 2019-07-13: 05:00:00 3 [IU] via SUBCUTANEOUS
  Administered 2019-07-14: 17:00:00 7 [IU] via SUBCUTANEOUS
  Administered 2019-07-14: 3 [IU] via SUBCUTANEOUS
  Administered 2019-07-14: 2 [IU] via SUBCUTANEOUS
  Administered 2019-07-14: 21:00:00 7 [IU] via SUBCUTANEOUS
  Administered 2019-07-14: 01:00:00 2 [IU] via SUBCUTANEOUS
  Administered 2019-07-14: 13:00:00 3 [IU] via SUBCUTANEOUS
  Administered 2019-07-15 (×2): 9 [IU] via SUBCUTANEOUS
  Administered 2019-07-15: 01:00:00 5 [IU] via SUBCUTANEOUS

## 2019-07-11 MED ORDER — SODIUM CHLORIDE 0.9 % IV SOLN
Freq: Once | INTRAVENOUS | Status: AC
  Administered 2019-07-11: 13:00:00 via INTRAVENOUS

## 2019-07-11 MED ORDER — HEPARIN SODIUM (PORCINE) 1000 UNIT/ML DIALYSIS
1000.0000 [IU] | INTRAMUSCULAR | Status: DC | PRN
  Administered 2019-07-12: 16:00:00 2400 [IU] via INTRAVENOUS_CENTRAL
  Filled 2019-07-11 (×3): qty 6

## 2019-07-11 MED ORDER — ETOMIDATE 2 MG/ML IV SOLN
20.0000 mg | Freq: Once | INTRAVENOUS | Status: AC
  Administered 2019-07-11: 17:00:00 20 mg via INTRAVENOUS

## 2019-07-11 MED ORDER — PRISMASOL BGK 4/2.5 32-4-2.5 MEQ/L REPLACEMENT SOLN
Status: DC
  Administered 2019-07-12: 03:00:00 via INTRAVENOUS_CENTRAL
  Filled 2019-07-11 (×5): qty 5000

## 2019-07-11 MED ORDER — SODIUM CHLORIDE 0.9 % IV SOLN
2.0000 g | Freq: Every day | INTRAVENOUS | Status: AC
  Administered 2019-07-12 – 2019-07-15 (×5): 2 g via INTRAVENOUS
  Filled 2019-07-11 (×5): qty 20

## 2019-07-11 MED ORDER — SODIUM BICARBONATE 8.4 % IV SOLN
INTRAVENOUS | Status: AC
  Filled 2019-07-11: qty 50

## 2019-07-11 MED ORDER — SODIUM CHLORIDE 0.9 % IV SOLN: INTRAVENOUS | Status: DC | PRN

## 2019-07-11 MED ORDER — SODIUM CHLORIDE 0.9 % IV SOLN
100.0000 mg | INTRAVENOUS | Status: AC
  Administered 2019-07-12 – 2019-07-15 (×4): 100 mg via INTRAVENOUS
  Filled 2019-07-11 (×4): qty 20

## 2019-07-11 MED ORDER — HEPARIN SODIUM (PORCINE) 10000 UNIT/ML IJ SOLN
7500.0000 [IU] | Freq: Three times a day (TID) | INTRAMUSCULAR | Status: DC
  Administered 2019-07-12 – 2019-07-22 (×31): 7500 [IU] via SUBCUTANEOUS
  Filled 2019-07-11 (×33): qty 1

## 2019-07-11 MED ORDER — LACTATED RINGERS IV BOLUS
1000.0000 mL | Freq: Once | INTRAVENOUS | Status: AC
  Administered 2019-07-11: 1000 mL via INTRAVENOUS

## 2019-07-11 MED ORDER — MIDAZOLAM HCL 2 MG/2ML IJ SOLN
INTRAMUSCULAR | Status: AC
  Filled 2019-07-11: qty 4

## 2019-07-11 NOTE — ED Notes (Signed)
O2 sats 88% on high flow O2, Respiratory notified. Pt on right side, pt turned to prone position, sats increased to 91%. Respiratory aware.

## 2019-07-11 NOTE — ED Provider Notes (Signed)
Physical Exam  BP (!) 164/95   Pulse (!) 114   Temp 98.2 F (36.8 C) (Rectal)   Resp (!) 43   Ht 5\' 6"  (1.676 m)   Wt 81.6 kg   SpO2 (!) 81%   BMI 29.05 kg/m   Physical Exam  ED Course/Procedures   Clinical Course as of Jul 14 1633  Tue Jul 11, 2019  1321 Patient noted to be in acute renal failure  Creatinine(!): 14.63 [AH]  1324 Patient in acute renal failure    [AH]  1418 pH, Arterial(!): 7.281 [AH]  1418 pCO2 arterial(!!): <19.0 [AH]  1418 Patient in acute hypoxic respiratory failure with uncompensated acidosis.  pO2, Arterial(!): 72.1 [AH]  1419 LDH(!): 429 [AH]  1419 Fibrinogen(!): >800 [AH]  1419 D-Dimer, Quant(!): 2.52 [AH]  1419 Hemoglobin: 15.3 [AH]  1419 WBC: 9.5 [AH]  1419 Patient BUN at 219.  His bicarb is unreasonably low on his ABG.  Patient's respiratory rate is likely secondary to his metabolic acidosis.  Has an anion gap greater than 20.  He continues to have a elevated respiratory rate.  Respiratory therapy is involved in the patient's care.  We will try to lie the patient prone, continued oxygen supplementation and see if we can decrease his drive with correction of his acidosis, fluid rehydration.  I have ordered an amp of bicarb.  We will continue to closely monitor this patient.  BUN(!): 219 [AH]  1600 Patient's Covid 19 is pending but supposedly has an outpatient positive test.  His lactic acid has come back elevated and this is after 3 L of fluid however I suspect that this is secondary to massive dehydration and acute renal failure.  We will continue to give patient fluids.    [AH]  1652 I spoke with Dr. Dyann Kief who asks that I consult with Critical care on this patient    [id number] I spoke with Dr.Yacoub who asks that we consult with [facility] critical care because the patient may need dialysis.    [AH]  [place] spoke with Dr Chase Caller who asks that we get a 2 hour covid test and consult when resulted    [AH]  1805 Patient with  worsening respiratory failure despite prone position and high flow nasal cannula. Patient in respiratory distress and requiring emergent hospitalization. I spoke with the patient's son about his condition. He reports that the patient lives with his grandmother who was recently hospitalized with covid.   [AH]  1931 I spoke with Dr. Chase Caller who has accepted the patient to [facility]. He has requested that I place a central line and give 2L LR bolus. Would like a repeat VBG after bolus and consider biarb drip.    [AH]  2033 I placed a right femoral central line- 2L LR bolus ordered.    [AH]    Clinical Course User Index [AH] Margarita Mail, PA-C    .Critical Care Performed by: Varney Biles, MD Authorized by: Varney Biles, MD   Critical care provider statement:    Critical care time (minutes):  35   Critical care time was exclusive of:  Separately billable procedures and treating other patients   Critical care was necessary to treat or prevent imminent or life-threatening deterioration of the following conditions:  Metabolic crisis, renal failure and respiratory failure   Critical care was time spent personally by me on the following activities:  Discussions with consultants, evaluation of patient's response to treatment, examination of patient, ordering and  performing treatments and interventions, ordering and review of laboratory studies, ordering and review of radiographic studies, pulse oximetry, re-evaluation of patient's condition, obtaining history from patient or surrogate and review of old charts Procedure Name: Intubation Date/Time: 07/09/2019 5:45 PM Performed by: Varney Biles, MD Pre-anesthesia Checklist: Patient identified, Patient being monitored, Emergency Drugs available, Timeout performed and Suction available Oxygen Delivery Method: Non-rebreather mask Preoxygenation: Pre-oxygenation with 100% oxygen Induction Type: Rapid sequence Ventilation: Mask  ventilation without difficulty Laryngoscope Size: Glidescope and 3 Grade View: Grade I Tube size: 7.5 mm Number of attempts: 1 Placement Confirmation: ETT inserted through vocal cords under direct vision,  CO2 detector and Breath sounds checked- equal and bilateral Secured at: 23 cm       MDM  I assumed care of this patient with Margarita Mail from Dr. Venita Sheffield at 3:30 PM.  Patient arrived to the ER with chief complaint of shortness of breath. He allegedly has tested Covid positive at outside institution. Labs are also indicative of underlying COVID-19 infection.  X-ray shows pneumonia.  Patient in acute hypoxic respiratory failure. He is currently on high flow nasal cannula and has received IV Decadron.  O2 sats have stabilized but patient still noted to be tachypneic.  If he decompensates further he will need intubation.  Patient is also in acute renal failure with uremia showing BUN over 200.  Patient is following commands allegedly.  We will continue to monitor him closely.  Our COVID-19 test pending.   Reassessment: At 5:30 PM he was brought to my attention that patient's oxygen saturations were dropping.  He is also tachypneic.  Patient is confused, which appears to be his baseline. We proceeded with intubation.  Patient was breathing 36 times a minute and had O2 sats of 81% on nonrebreather. There were no complications from the intubation.  Patient will be admitted to ICU team.   Reassessment: I supervised the APP completing the femoral central line.  No complications occurred.  CENTRAL LINE Performed by: Margarita Mail Supervised by: Maitland Lesiak Consent: The procedure was performed in an emergent situation. Required items: required blood products, implants, devices, and special equipment available Patient identity confirmed: arm band and provided demographic data Time out: Immediately prior to procedure a "time out" was called to verify the correct patient,  procedure, equipment, support staff and site/side marked as required. Indications: vascular access Anesthesia: local infiltration Local anesthetic: lidocaine 1% with epinephrine Anesthetic total: 3 ml Patient sedated: no Preparation: skin prepped with 2% chlorhexidine Skin prep agent dried: skin prep agent completely dried prior to procedure Sterile barriers: all five maximum sterile barriers used - cap, mask, sterile gown, sterile gloves, and large sterile sheet Hand hygiene: hand hygiene performed prior to central venous catheter insertion  Location details: right groin  Catheter type: triple lumen Catheter size: 8 Fr Pre-procedure: landmarks identified Ultrasound guidance: yes Successful placement: yes Post-procedure: line sutured and dressing applied Assessment: blood return through all parts, free fluid flow, placement verified by x-ray and no pneumothorax on x-ray Patient tolerance: Patient tolerated the procedure well with no immediate complications.    Varney Biles, MD 07/15/19 1635

## 2019-07-11 NOTE — ED Notes (Signed)
EDP at bedside  

## 2019-07-11 NOTE — Progress Notes (Signed)
[facility] Progress Note Patient Name: Colen Renee DOB: May 01, 1956 MRN: [id number]   Date of Service  06/28/2019  HPI/Events of Note  Hyperglycemia (BS 261 mg %), no prior hx of diabetes.  eICU Interventions  Hemoglobin A1c ordered, Phase 1 sensitive hyperglycemia SQ insulin protocol ordered.        Kerry Kass Voncile Schwarz 07/23/2019, 10:57 PM

## 2019-07-11 NOTE — ED Notes (Signed)
ED Provider at bedside. 

## 2019-07-11 NOTE — ED Notes (Signed)
Pt placed on vent by respiratory.

## 2019-07-11 NOTE — ED Notes (Signed)
Pt sats low 80's, EDP notified. Pt pulled IV out in hand, Pt with stool noted on him, Pt pulled linens and gown off, Pt cleaned and bed changed.

## 2019-07-11 NOTE — Progress Notes (Signed)
   Call to 667-pm at [place] from [facility] ER PA at [facility]  covvid + VDRF ATN and responsded with some urine to 4L fluids Poor iv access  Plan  - accept to [facility] due to possible need for HD though might get better with fluids  - place trialysis cath in [facility]  - 2 more liters LR in [facility]  - admit to [facility]cm at [facility] - Ms Craig Mccoy - ICU director informed --  Due tp potential HD need will not go to [facility]      SIGNATURE    Dr. Brand Males, M.D., F.C.C.P,  Pulmonary and Critical Care Medicine Staff Physician, [place] Director - Interstitial Lung Disease  Program  Pulmonary [place] at [place], [place], [id number]  Pager: [phone], If no answer or between  15:00h - 7:00h: call [phone] Telephone: [phone]  6:27 PM 07/24/2019

## 2019-07-11 NOTE — ED Notes (Signed)
Respiratory at bedside.

## 2019-07-11 NOTE — ED Notes (Signed)
Etomidate 20 mg given IV 

## 2019-07-11 NOTE — Consult Note (Signed)
Reason for Consult: Renal failure  Referring Physician:  Dr. Lucile Shutters  Chief Complaint:  Shortness of breath  Assessment/Plan: 1. AKI - secondary to sepsis + COVID with metabolic acidosis with a pH of 7.18, 500 ml UOP (229ml emptied and another 250 in the bag at time of exam). Patient already has a foley catheter to gravity; will check urine lytes +  start CRRT; 4K bath + UF 33ml/hr as tolerated and adjust as tolerated.  2. COVID PNA being tx with Decadron and Remdesivir. 3. Respiratory failure with hypoxia on a ventilator. 4. HTN - MAR doesn't show any home antihypertensives    HPI: Craig Mccoy is an 63 y.o. male with recently diagnosed COVID on 11/12 on home quarantine but now brought in by his EMS for difficulty breathing noted to have sats in the 70's by EMS. In [name] sats were 77% requiring NRFM with rise to 93% but increased RR despite proning. BMET showed significant renal failure and metabolic acidosis as well as a CXR c/w multifocal pneumonia started on Azithromycin and Ceftriaxone. Repeat COVID test was positive as well and pt eventually required intubation. BUN/Cr noted to be 210/14.63 with a HCO3 of 13 and K4.9 w/ LDH429, CRP 38, Procalcitonin 25 and LA 3.8. DDimer and fibrinogen were 2.52/>800  ROS Pertinent items are noted in HPI.  Chemistry and CBC: Creatinine, Ser  Date/Time Value Ref Range Status  06/29/2019 12:20 PM 14.63 (H) 0.61 - 1.24 mg/dL Final   Recent Labs  Lab 07/03/2019 1220 07/16/2019 2246  NA 143 141  K 4.9 4.7  CL 102  --   CO2 13*  --   GLUCOSE 211*  --   BUN 219*  --   CREATININE 14.63*  --   CALCIUM 9.1  --    Recent Labs  Lab 07/14/2019 1220 07/05/2019 2246  WBC 9.5  --   NEUTROABS 8.4*  --   HGB 15.3 12.6*  HCT 48.2 37.0*  MCV 72.9*  --   PLT 352  --    Liver Function Tests: Recent Labs  Lab 06/26/2019 1220  AST 76*  ALT 65*  ALKPHOS 73  BILITOT 0.9  PROT 8.8*  ALBUMIN 3.2*   No results for input(s): LIPASE, AMYLASE in the last 168  hours. Recent Labs  Lab 07/08/2019 1357  AMMONIA 30   Cardiac Enzymes: No results for input(s): CKTOTAL, CKMB, CKMBINDEX, TROPONINI in the last 168 hours. Iron Studies:  Recent Labs    06/26/2019 1357  FERRITIN 2,490*   PT/INR: @LABRCNTIP (inr:5)  Xrays/Other Studies: ) Results for orders placed or performed during the hospital encounter of 07/17/2019 (from the past 48 hour(s))  CBC WITH DIFFERENTIAL     Status: Abnormal   Collection Time: 07/19/2019 12:20 PM  Result Value Ref Range   WBC 9.5 4.0 - 10.5 K/uL   RBC 6.61 (H) 4.22 - 5.81 MIL/uL   Hemoglobin 15.3 13.0 - 17.0 g/dL   HCT 48.2 39.0 - 52.0 %   MCV 72.9 (L) 80.0 - 100.0 fL   MCH 23.1 (L) 26.0 - 34.0 pg   MCHC 31.7 30.0 - 36.0 g/dL   RDW 17.7 (H) 11.5 - 15.5 %   Platelets 352 150 - 400 K/uL   nRBC 0.0 0.0 - 0.2 %   Neutrophils Relative % 89 %   Neutro Abs 8.4 (H) 1.7 - 7.7 K/uL   Lymphocytes Relative 6 %   Lymphs Abs 0.6 (L) 0.7 - 4.0 K/uL   Monocytes Relative 4 %  Monocytes Absolute 0.4 0.1 - 1.0 K/uL   Eosinophils Relative 0 %   Eosinophils Absolute 0.0 0.0 - 0.5 K/uL   Basophils Relative 0 %   Basophils Absolute 0.0 0.0 - 0.1 K/uL   WBC Morphology FEW BANDS    Immature Granulocytes 1 %   Abs Immature Granulocytes 0.07 0.00 - 0.07 K/uL   Reactive, Benign Lymphocytes PRESENT     Comment: FEW Performed at [facility], [address], [place], [place] [address]   Comprehensive metabolic panel     Status: Abnormal   Collection Time: 07/16/2019 12:20 PM  Result Value Ref Range   Sodium 143 135 - 145 mmol/L   Potassium 4.9 3.5 - 5.1 mmol/L   Chloride 102 98 - 111 mmol/L   CO2 13 (L) 22 - 32 mmol/L   Glucose, Bld 211 (H) 70 - 99 mg/dL   BUN 219 (H) 8 - 23 mg/dL    Comment: RESULTS CONFIRMED BY MANUAL DILUTION   Creatinine, Ser 14.63 (H) 0.61 - 1.24 mg/dL   Calcium 9.1 8.9 - 10.3 mg/dL   Total Protein 8.8 (H) 6.5 - 8.1 g/dL   Albumin 3.2 (L) 3.5 - 5.0 g/dL   AST 76 (H) 15 - 41 U/L   ALT 65 (H) 0 - 44 U/L    Alkaline Phosphatase 73 38 - 126 U/L   Total Bilirubin 0.9 0.3 - 1.2 mg/dL   GFR calc non Af Amer 3 (L) >60 mL/min   GFR calc Af Amer 4 (L) >60 mL/min   Anion gap >20 (H) 5 - 15    Comment: Performed at [facility], [address] [place], [place] [address]  D-dimer, quantitative     Status: Abnormal   Collection Time: 07/06/2019 12:20 PM  Result Value Ref Range   D-Dimer, Quant 2.52 (H) 0.00 - 0.50 ug/mL-FEU    Comment: (NOTE) At the manufacturer cut-off of 0.50 ug/mL FEU, this assay has been documented to exclude PE with a sensitivity and negative predictive value of 97 to 99%.  At this time, this assay has not been approved by the FDA to exclude DVT/VTE. Results should be correlated with clinical presentation. Performed at [facility], [address] [place], [place] [address]   Procalcitonin     Status: None   Collection Time: 07/04/2019 12:20 PM  Result Value Ref Range   Procalcitonin 25.01 ng/mL    Comment:        Interpretation: PCT >= 10 ng/mL: Important systemic inflammatory response, almost exclusively due to severe bacterial sepsis or septic shock. (NOTE)       Sepsis PCT Algorithm           Lower Respiratory Tract                                      Infection PCT Algorithm    ----------------------------     ----------------------------         PCT < 0.25 ng/mL                PCT < 0.10 ng/mL         Strongly encourage             Strongly discourage   discontinuation of antibiotics    initiation of antibiotics    ----------------------------     -----------------------------       PCT 0.25 - 0.50 ng/mL  PCT 0.10 - 0.25 ng/mL               OR       >80% decrease in PCT            Discourage initiation of                                            antibiotics      Encourage discontinuation           of antibiotics    ----------------------------     -----------------------------         PCT >= 0.50 ng/mL              PCT 0.26 - 0.50 ng/mL                 AND       <80% decrease in PCT             Encourage initiation of                                             antibiotics       Encourage continuation           of antibiotics    ----------------------------     -----------------------------        PCT >= 0.50 ng/mL                  PCT > 0.50 ng/mL               AND         increase in PCT                  Strongly encourage                                      initiation of antibiotics    Strongly encourage escalation           of antibiotics                                     -----------------------------                                           PCT <= 0.25 ng/mL                                                 OR                                        > 80% decrease in PCT  Discontinue / Do not initiate                                             antibiotics Performed at [facility], [address], [place], [place] [address]   Lactate dehydrogenase     Status: Abnormal   Collection Time: 06/25/2019 12:20 PM  Result Value Ref Range   LDH 429 (H) 98 - 192 U/L    Comment: Performed at [facility], [address], [place], [place] [address]  Fibrinogen     Status: Abnormal   Collection Time: 06/30/2019 12:20 PM  Result Value Ref Range   Fibrinogen >800 (H) 210 - 475 mg/dL    Comment: Performed at [facility], [address] [place], [place] [address]  Blood gas, arterial     Status: Abnormal   Collection Time: 07/10/2019 12:55 PM  Result Value Ref Range   FIO2 100.00    pH, Arterial 7.281 (L) 7.350 - 7.450   pCO2 arterial <19.0 (LL) 32.0 - 48.0 mmHg    Comment: CRITICAL RESULT CALLED TO, READ BACK BY AND VERIFIED WITH: MARTIN C. AT 1307 ON 07/22/2019 BY THOMPSON S.    pO2, Arterial 72.1 (L) 83.0 - 108.0 mmHg   Bicarbonate 12.1 (L) 20.0 - 28.0 mmol/L   Acid-base deficit 17.9 (H) 0.0 - 2.0 mmol/L   O2 Saturation 90.3 %   Patient temperature 37.0    Allens test (pass/fail) PASS PASS    Comment:  Performed at [facility], [address], [place], [place] [address]  Lactic acid, plasma     Status: Abnormal   Collection Time: 07/15/2019  1:57 PM  Result Value Ref Range   Lactic Acid, Venous 3.8 (HH) 0.5 - 1.9 mmol/L    Comment: CRITICAL RESULT CALLED TO, READ BACK BY AND VERIFIED WITH: CRAWFORD,RN AT 1559 ON 11.17.20 BY ISLEY,B Performed at [facility], [address] [place], [place] [address]   Blood Culture (routine x 2)     Status: None (Preliminary result)   Collection Time: 07/01/2019  1:57 PM   Specimen: BLOOD RIGHT WRIST  Result Value Ref Range   Specimen Description BLOOD RIGHT WRIST    Special Requests      BOTTLES DRAWN AEROBIC ONLY Blood Culture results may not be optimal due to an inadequate volume of blood received in culture bottles Performed at [facility], [address] [place], [place] [address]    Culture PENDING    Report Status PENDING   Blood Culture (routine x 2)     Status: None (Preliminary result)   Collection Time: 07/23/2019  1:57 PM   Specimen: BLOOD RIGHT ARM  Result Value Ref Range   Specimen Description BLOOD RIGHT ARM    Special Requests      BOTTLES DRAWN AEROBIC AND ANAEROBIC Blood Culture adequate volume Performed at [facility], [address], [place], [place] [address]    Culture PENDING    Report Status PENDING   Ferritin     Status: Abnormal   Collection Time: 07/06/2019  1:57 PM  Result Value Ref Range   Ferritin 2,490 (H) 24 - 336 ng/mL    Comment: Performed at [facility], [address], [place], [place] [address]  Triglycerides     Status: Abnormal   Collection Time: 07/02/2019  1:57 PM  Result Value Ref Range   Triglycerides 397 (H) <  150 mg/dL    Comment: Performed at [facility], [address], [place], [place] [address]  C-reactive protein     Status: Abnormal   Collection Time: 07/13/2019  1:57 PM  Result Value Ref Range   CRP 38.0 (H) <1.0 mg/dL    Comment: RESULTS CONFIRMED BY MANUAL DILUTION Performed at [facility], [address], [place], [place] [address]   Ammonia     Status: None   Collection Time: 07/18/2019  1:57 PM  Result Value Ref Range   Ammonia 30 9 - 35 umol/L    Comment: Performed at [facility], [address], [place], [place] [address]  Magnesium     Status: Abnormal   Collection Time: 07/12/2019  1:57 PM  Result Value Ref Range   Magnesium 4.2 (H) 1.7 - 2.4 mg/dL    Comment: Performed at [place] [facility], [address] [place], [place] [address]  CBG monitoring, ED     Status: Abnormal   Collection Time: 07/08/2019  2:58 PM  Result Value Ref Range   Glucose-Capillary 176 (H) 70 - 99 mg/dL  SARS Coronavirus 2 by RT PCR (hospital order, performed in [facility] hospital lab) Nasopharyngeal Nasopharyngeal Swab     Status: Abnormal   Collection Time: 07/09/2019  3:20 PM   Specimen: Nasopharyngeal Swab  Result Value Ref Range   SARS Coronavirus 2 POSITIVE (A) NEGATIVE    Comment: RESULT CALLED TO, READ BACK BY AND VERIFIED WITH: WHITE,M AT 1806 ON 11.17.20 BY ISLEY,B (NOTE) If result is NEGATIVE SARS-CoV-2 target nucleic acids are NOT DETECTED. The SARS-CoV-2 RNA is generally detectable in upper and lower  respiratory specimens during the acute phase of infection. The lowest  concentration of SARS-CoV-2 viral copies this assay can detect is 250  copies / mL. A negative result does not preclude SARS-CoV-2 infection  and should not be used as the sole basis for treatment or other  patient management decisions.  A negative result may occur with  improper specimen collection / handling, submission of specimen other  than nasopharyngeal swab, presence of viral mutation(s) within the  areas targeted by this assay, and inadequate number of viral copies  (<250 copies / mL). A negative result must be combined with clinical  observations, patient history, and epidemiological information. If result is POSITIVE SARS-CoV-2 target nucleic acids are DETECTED.  The SARS-CoV-2 RNA is generally  detectable in upper and lower  respiratory specimens during the acute phase of infection.  Positive  results are indicative of active infection with SARS-CoV-2.  Clinical  correlation with patient history and other diagnostic information is  necessary to determine patient infection status.  Positive results do  not rule out bacterial infection or co-infection with other viruses. If result is PRESUMPTIVE POSTIVE SARS-CoV-2 nucleic acids MAY BE PRESENT.   A presumptive positive result was obtained on the submitted specimen  and confirmed on repeat testing.  While 2019 novel coronavirus  (SARS-CoV-2) nucleic acids may be present in the submitted sample  additional confirmatory testing may be necessary for epidemiological  and / or clinical management purposes  to differentiate between  SARS-CoV-2 and other Sarbecovirus currently known to infect humans.  If clinically indicated additional testing with an alternate test  methodology [phone])  is advised. The SARS-CoV-2 RNA is generally  detectable in upper and lower respiratory specimens during the acute  phase of infection. The expected result is Negative. Fact Sheet for Patients:  StrictlyIdeas.no Fact Sheet for Healthcare Providers: BankingDealers.co.za This test is  not yet approved or cleared by the [place] and has been authorized for detection and/or diagnosis of SARS-CoV-2 by FDA under an Emergency Use Authorization (EUA).  This EUA will remain in effect (meaning this test can be used) for the duration of the COVID-19 declaration under Section 564(b)(1) of the Act, 21 U.S.C. section 360bbb-3(b)(1), unless the authorization is terminated or revoked sooner. Performed at [facility], [address] [place], [place] [address]   Lactic acid, plasma     Status: Abnormal   Collection Time: 07/09/2019  4:00 PM  Result Value Ref Range   Lactic Acid, Venous 3.7 (HH) 0.5 - 1.9 mmol/L     Comment: CRITICAL VALUE NOTED.  VALUE IS CONSISTENT WITH PREVIOUSLY REPORTED AND CALLED VALUE. Performed at [facility], [address] [place], [place] [address]   Glucose, capillary     Status: Abnormal   Collection Time: 07/12/2019 10:40 PM  Result Value Ref Range   Glucose-Capillary 261 (H) 70 - 99 mg/dL   Comment 1 Notify RN    Comment 2 Document in Chart   I-STAT 7, (LYTES, BLD GAS, ICA, H+H)     Status: Abnormal   Collection Time: 07/19/2019 10:46 PM  Result Value Ref Range   pH, Arterial 7.180 (LL) 7.350 - 7.450   pCO2 arterial 36.9 32.0 - 48.0 mmHg   pO2, Arterial 162.0 (H) 83.0 - 108.0 mmHg   Bicarbonate 13.8 (L) 20.0 - 28.0 mmol/L   TCO2 15 (L) 22 - 32 mmol/L   O2 Saturation 99.0 %   Acid-base deficit 14.0 (H) 0.0 - 2.0 mmol/L   Sodium 141 135 - 145 mmol/L   Potassium 4.7 3.5 - 5.1 mmol/L   Calcium, Ion 1.10 (L) 1.15 - 1.40 mmol/L   HCT 37.0 (L) 39.0 - 52.0 %   Hemoglobin 12.6 (L) 13.0 - 17.0 g/dL   Patient temperature 98.6 F    Collection site RADIAL, ALLEN'S TEST ACCEPTABLE    Drawn by RT    Sample type ARTERIAL    Comment NOTIFIED PHYSICIAN    Dg Chest Port 1 View  Result Date: 07/19/2019 CLINICAL DATA:  Post intubation EXAM: PORTABLE CHEST 1 VIEW COMPARISON:  Radiograph 07/20/2019 FINDINGS: Endotracheal tube terminates in the mid trachea, approximately 5 cm from the carina. A transesophageal tube tip is distal to the GE junction however the side port remains in the distal thoracic esophagus and should be advanced for optimal function. Redemonstrated elevation of the left hemidiaphragm with diffuse bilateral interstitial and airspace opacities. Cardiomediastinal contours are grossly unchanged from prior accounting for differences in rotation and technique. IMPRESSION: 1. Endotracheal tube terminates in the mid trachea, approximately 5 cm from the carina. 2. A transesophageal tube tip is distal to the GE junction however the side port remains in the distal thoracic  esophagus and tube should be advanced 3-4 cm for optimal function. 3. Unchanged diffuse bilateral interstitial and airspace opacities. Electronically Signed   By: Lovena Le M.D.   On: 07/04/2019 18:44   Dg Chest Portable 1 View  Result Date: 06/26/2019 CLINICAL DATA:  Shortness of breath, COVID-19 EXAM: PORTABLE CHEST 1 VIEW COMPARISON:  None. FINDINGS: Low volume AP portable examination with elevation of the left hemidiaphragm and diffuse bilateral interstitial and heterogeneous airspace opacity. The heart and mediastinum are unremarkable. IMPRESSION: Low volume AP portable examination with elevation of the left hemidiaphragm and diffuse bilateral interstitial and heterogeneous airspace opacity. Findings are consistent with reported diagnosis of COVID-19. Electronically Signed   By:  Eddie Candle M.D.   On: 07/09/2019 13:15    PMH:   Past Medical History:  Diagnosis Date  . Hypertension   . MI (myocardial infarction) ([place])     PSH:   Past Surgical History:  Procedure Laterality Date  . COLONOSCOPY N/A 06/22/2014   Procedure: COLONOSCOPY;  Surgeon: Rogene Houston, MD;  Location: [facility] ENDO SUITE;  Service: Endoscopy;  Laterality: N/A;  1230 - moved to 2:15 - Ann to notify    Allergies: No Known Allergies  Medications:   Prior to Admission medications   Medication Sig Start Date End Date Taking? Authorizing Provider  benzonatate (TESSALON) 100 MG capsule Take 100-200 mg by mouth 3 (three) times daily as needed for cough.   Yes [provider]  EDARBYCLOR 40-12.5 MG TABS Take 1 tablet by mouth daily. 06/01/19  Yes [provider]  pravastatin (PRAVACHOL) 10 MG tablet Take 10 mg by mouth daily. 05/03/19  Yes [provider]  sildenafil (VIAGRA) 50 MG tablet Take 50 mg by mouth daily as needed for erectile dysfunction.   Yes [provider]    Discontinued Meds:   Medications Discontinued During This Encounter  Medication Reason  . predniSONE  (DELTASONE) 10 MG tablet One time medication  . HYDROcodone-acetaminophen (NORCO/VICODIN) 5-325 MG tablet One time medication  . dexamethasone (DECADRON) 4 MG tablet One time medication  . atenolol (TENORMIN) 50 MG tablet Discontinued by provider  . hydrocortisone (ANUSOL-HC) 25 MG suppository Completed Course  . ibuprofen (ADVIL,MOTRIN) 600 MG tablet Discontinued by provider  . insulin aspart (novoLOG) injection 1-3 Units     Social History:  reports that he has never smoked. He has never used smokeless tobacco. He reports current alcohol use. He reports previous drug use.  Family History:  No family history on file.  Blood pressure 110/80, pulse 88, temperature 97.6 F (36.4 C), temperature source Axillary, resp. rate (!) 27, height 5\' 6"  (1.676 m), weight 80 kg, SpO2 100 %. General appearance: Intubated Head: Normocephalic, without obvious abnormality, atraumatic Eyes: negative Neck: no adenopathy, no carotid bruit, supple, symmetrical, trachea midline and thyroid not enlarged, symmetric, no tenderness/mass/nodules Back: symmetric, no curvature. ROM normal. No CVA tenderness. Resp: clear to auscultation bilaterally and intubated Cardio: regular rate and rhythm GI: soft, non-tender; bowel sounds normal; no masses,  no organomegaly Extremities: edema none Pulses: 2+ and symmetric       Mehmet Scally, Hunt Oris, MD 07/10/2019, 11:50 PM

## 2019-07-11 NOTE — ED Notes (Addendum)
Date and time results received: 07/19/2019  1:08 PM  Test: PCO2 - ABG Critical Value: <19  Name of Provider Notified: Nira Conn, RN

## 2019-07-11 NOTE — ED Notes (Signed)
CRITICAL VALUE ALERT  Critical Value:  Lactic 3.8  Date & Time Notied:  1559, 07/14/2019  Provider Notified: A. Deman, [place]  Orders Received/Actions taken: no new orders

## 2019-07-11 NOTE — ED Notes (Signed)
Respiratory at bedside. Pt changed to high flow O2 per respiratory.

## 2019-07-11 NOTE — ED Notes (Signed)
Respiratory notifed O2 sats 88% on HFNC at 11L and RR 40's.

## 2019-07-11 NOTE — ED Notes (Addendum)
Pt intubated by Dr Kathrynn Humble, 24 at lip. +color change on CO2 detector, Bilateral breath sounds noted by A. Brockwell PA.

## 2019-07-11 NOTE — ED Notes (Signed)
Spoke with MD and PA about the possibility of proning patient and assessing after 30 mins. We all agreed to try this instead of Bipap at this time. He initially responded well SPO2 94-97 and HFNC in place at 11lpm. His RR is now ranging from 22-32. He states he is comfortable at this time.

## 2019-07-11 NOTE — ED Notes (Signed)
Rocuronium 100 mg given IV

## 2019-07-11 NOTE — ED Provider Notes (Signed)
[facility] EMERGENCY DEPARTMENT Provider Note   CSN: [id number] Arrival date & time: 07/04/2019  1209     History   Chief Complaint Chief Complaint  Patient presents with   Shortness of Breath    HPI Craig Mccoy is a 63 y.o. male presents with critically ill respiratory distress. There is a level 5 caveat due to altered mentation and respiratory distress.  According to nurse triage note see patient tested positive for coronavirus last Thursday.  The patient reports that his "lady friend" called EMS today however he is not a reliable historian at this time.  EMS reports that the patient's oxygen saturations were very low upon arrival.  He was placed on 3 L of oxygen and they were unable to bring his oxygen saturations above 80%.  He was 77% on nasal cannula at arrival.  He was placed on nonrebreather there at 15 L and oxygen saturations increased to 93%.     HPI  Past Medical History:  Diagnosis Date   Hypertension    MI (myocardial infarction) [facility])     Patient Active Problem List   Diagnosis Date Noted   Rectal bleeding 06/14/2014    Past Surgical History:  Procedure Laterality Date   COLONOSCOPY N/A 06/22/2014   Procedure: COLONOSCOPY;  Surgeon: Rogene Houston, MD;  Location: [facility] ENDO SUITE;  Service: Endoscopy;  Laterality: N/A;  1230 - moved to 2:15 - Ann to notify        Home Medications    Prior to Admission medications   Medication Sig Start Date End Date Taking? Authorizing Provider  benzonatate (TESSALON) 100 MG capsule Take 100-200 mg by mouth 3 (three) times daily as needed for cough.   Yes [provider]  EDARBYCLOR 40-12.5 MG TABS Take 1 tablet by mouth daily. 06/01/19  Yes [provider]  pravastatin (PRAVACHOL) 10 MG tablet Take 10 mg by mouth daily. 05/03/19  Yes [provider]  sildenafil (VIAGRA) 50 MG tablet Take 50 mg by mouth daily as needed for erectile dysfunction.   Yes [provider]    Family  History No family history on file.  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   Review of Systems Review of Systems  Unable to review systems secondary to respiratory distress and altered mentation Physical Exam Updated Vital Signs BP (!) 164/102    Pulse (!) 124    Temp 98.2 F (36.8 C) (Rectal)    Resp 18    Ht 5\' 6"  (1.676 m)    Wt 81.6 kg    SpO2 100%    BMI 29.05 kg/m   Physical Exam Nursing note reviewed.  Constitutional:      General: He is in acute distress.     Appearance: He is normal weight. He is ill-appearing and toxic-appearing.     Interventions: Face mask in place.  HENT:     Mouth/Throat:     Mouth: Mucous membranes are dry.  Eyes:     Comments: Sunken appearance  Neck:     Musculoskeletal: Normal range of motion and neck supple.     Vascular: No JVD.  Cardiovascular:  Rate and Rhythm: Tachycardia present.  Pulmonary:     Effort: Tachypnea present.     Breath sounds: Rhonchi present.  Chest:     Chest wall: No tenderness or crepitus.  Abdominal:     Palpations: Abdomen is soft.     Tenderness: There is no abdominal tenderness. There is no guarding or rebound.  Musculoskeletal:     Right lower leg: No edema.     Left lower leg: No edema.  Skin:    Comments: Cool extremities,. No mottling- cap refill 8sec in the feet and hands  Neurological:     General: No focal deficit present.     Mental Status: He is alert. He is confused.     GCS: GCS eye subscore is 4. GCS verbal subscore is 4. GCS motor subscore is 6.  Psychiatric:        Behavior: Behavior is cooperative.      ED Treatments / Results  Labs (all labs ordered are listed, but only abnormal results are displayed) Labs  Reviewed  SARS CORONAVIRUS 2 BY RT PCR ([place]) - Abnormal; Notable for the following components:      Result Value   SARS Coronavirus 2 POSITIVE (*)    All other components within normal limits  LACTIC ACID, PLASMA - Abnormal; Notable for the following components:   Lactic Acid, Venous 3.8 (*)    All other components within normal limits  LACTIC ACID, PLASMA - Abnormal; Notable for the following components:   Lactic Acid, Venous 3.7 (*)    All other components within normal limits  CBC WITH DIFFERENTIAL/PLATELET - Abnormal; Notable for the following components:   RBC 6.61 (*)    MCV 72.9 (*)    MCH 23.1 (*)    RDW 17.7 (*)    Neutro Abs 8.4 (*)    Lymphs Abs 0.6 (*)    All other components within normal limits  COMPREHENSIVE METABOLIC PANEL - Abnormal; Notable for the following components:   CO2 13 (*)    Glucose, Bld 211 (*)    BUN 219 (*)    Creatinine, Ser 14.63 (*)    Total Protein 8.8 (*)    Albumin 3.2 (*)    AST 76 (*)    ALT 65 (*)    GFR calc non Af Amer 3 (*)    GFR calc Af Amer 4 (*)    Anion gap >20 (*)    All other components within normal limits  D-DIMER, QUANTITATIVE (NOT AT [facility]) - Abnormal; Notable for the following components:   D-Dimer, Quant 2.52 (*)    All other components within normal limits  LACTATE DEHYDROGENASE - Abnormal; Notable for the following components:   LDH 429 (*)    All other components within normal limits  FERRITIN - Abnormal; Notable for the following components:   Ferritin 2,490 (*)    All other components within normal limits  TRIGLYCERIDES - Abnormal; Notable for the following components:   Triglycerides 397 (*)    All other components within normal limits  FIBRINOGEN - Abnormal; Notable for the following components:   Fibrinogen >800 (*)    All other components within normal limits  C-REACTIVE PROTEIN - Abnormal; Notable for the following components:   CRP 38.0 (*)    All other  components within normal limits  MAGNESIUM - Abnormal; Notable for the following components:   Magnesium 4.2 (*)    All other components within normal limits  BLOOD GAS, ARTERIAL - Abnormal; Notable for the following components:   pH, Arterial 7.281 (*)    pCO2 arterial <19.0 (*)    pO2, Arterial 72.1 (*)    Bicarbonate 12.1 (*)    Acid-base deficit 17.9 (*)    All other components within normal limits  CBG MONITORING, ED - Abnormal; Notable for the following components:   Glucose-Capillary 176 (*)    All other components within normal limits  CULTURE, BLOOD (ROUTINE X 2)  CULTURE, BLOOD (ROUTINE X 2)  PROCALCITONIN  AMMONIA  BLOOD GAS, ARTERIAL    EKG EKG Interpretation  Date/Time:  Tuesday July 11 2019 12:12:38 EST Ventricular Rate:  97 PR Interval:    QRS Duration: 89 QT Interval:  332 QTC Calculation: 411 R Axis:   24 Text Interpretation: Sinus rhythm Multiple premature complexes, vent & supraven Posterior infarct, old No previous ECGs available Interpretation limited secondary to artifact Confirmed by Fredia Sorrow [phone]) on 07/15/2019 12:32:15 PM   Radiology Dg Chest Port 1 View  Result Date: 06/30/2019 CLINICAL DATA:  Post intubation EXAM: PORTABLE CHEST 1 VIEW COMPARISON:  Radiograph 07/08/2019 FINDINGS: Endotracheal tube terminates in the mid trachea, approximately 5 cm from the carina. A transesophageal tube tip is distal to the GE junction however the side port remains in the distal thoracic esophagus and should be advanced for optimal function. Redemonstrated elevation of the left hemidiaphragm with diffuse bilateral interstitial and airspace opacities. Cardiomediastinal contours are grossly unchanged from prior accounting for differences in rotation and technique. IMPRESSION: 1. Endotracheal tube terminates in the mid trachea, approximately 5 cm from the carina. 2. A transesophageal tube tip is distal to the GE junction however the side port remains in the  distal thoracic esophagus and tube should be advanced 3-4 cm for optimal function. 3. Unchanged diffuse bilateral interstitial and airspace opacities. Electronically Signed   By: Lovena Le M.D.   On: 07/14/2019 18:44   Dg Chest Portable 1 View  Result Date: 07/17/2019 CLINICAL DATA:  Shortness of breath, COVID-19 EXAM: PORTABLE CHEST 1 VIEW COMPARISON:  None. FINDINGS: Low volume AP portable examination with elevation of the left hemidiaphragm and diffuse bilateral interstitial and heterogeneous airspace opacity. The heart and mediastinum are unremarkable. IMPRESSION: Low volume AP portable examination with elevation of the left hemidiaphragm and diffuse bilateral interstitial and heterogeneous airspace opacity. Findings are consistent with reported diagnosis of COVID-19. Electronically Signed   By: Eddie Candle M.D.   On: 06/29/2019 13:15    Procedures .Critical Care Performed by: Margarita Mail, PA-C Authorized by: Margarita Mail, PA-C   Critical care provider statement:    Critical care time (minutes):  90   Critical care time was exclusive of:  Separately billable procedures and treating other patients   Critical care was necessary to treat or prevent imminent or life-threatening deterioration of the following conditions:  CNS failure or compromise, respiratory failure and renal failure   Critical care was time spent personally by me on the following activities:  Discussions with consultants, evaluation of patient's response to treatment, examination of patient, ordering and performing treatments and interventions, ordering and review of laboratory studies, ordering and review of radiographic studies, pulse oximetry, re-evaluation of patient's condition, obtaining history from patient or surrogate, review of old charts, blood draw for specimens, vascular access procedures, development of treatment plan with patient or surrogate and interpretation of cardiac output measurements .Central  Line  Date/Time: 07/02/2019 8:23 PM Performed by: Margarita Mail, PA-C Authorized by: Margarita Mail, PA-C  Consent:    Consent obtained:  Emergent situation Pre-procedure details:    Hand hygiene: Hand hygiene performed prior to insertion     Sterile barrier technique: All elements of maximal sterile technique followed     Skin preparation:  2% chlorhexidine   Skin preparation agent: Skin preparation agent completely dried prior to procedure   Procedure details:    Location:  R femoral   Site selection rationale:  Patient may need dialysis catheter placement- opted for Fem line   Patient position:  Flat   Procedural supplies:  Triple lumen   Landmarks identified: yes     Ultrasound guidance: no     Number of attempts:  1   Successful placement: yes   Post-procedure details:    Post-procedure:  Dressing applied   Assessment:  Blood return through all ports and free fluid flow   Patient tolerance of procedure:  Tolerated well, no immediate complications   (including critical care time)  Medications Ordered in ED Medications  propofol (DIPRIVAN) 1000 MG/100ML infusion (15 mcg/kg/min  81.6 kg Intravenous Rate/Dose Change 07/07/2019 1806)  dexamethasone (DECADRON) injection 10 mg (10 mg Intravenous Given 07/14/2019 1254)  0.9 %  sodium chloride infusion ( Intravenous Stopped 07/24/2019 1730)  sodium chloride 0.9 % bolus 3,000 mL (0 mLs Intravenous Stopped 07/19/2019 1516)  sodium bicarbonate injection 50 mEq (50 mEq Intravenous Given 07/05/2019 1412)  lactated ringers bolus 1,000 mL (0 mLs Intravenous Stopped 07/03/2019 1704)  rocuronium (ZEMURON) injection 100 mg (100 mg Intravenous Given 06/30/2019 1729)  etomidate (AMIDATE) injection 20 mg (20 mg Intravenous Given 07/04/2019 1729)  lactated ringers bolus 2,000 mL (2,000 mLs Intravenous New Bag/Given 07/22/2019 2104)     Initial Impression / Assessment and Plan / ED Course  I have reviewed the triage vital signs and the nursing  notes.  Pertinent labs & imaging results that were available during my care of the patient were reviewed by me and considered in my medical decision making (see chart for details).  Clinical Course as of Jul 10 2204  Tue Jul 11, 2019  1321 Patient noted to be in acute renal failure  Creatinine(!): 14.63 [AH]  1324 Patient in acute renal failure    [AH]  1418 pH, Arterial(!): 7.281 [AH]  1418 pCO2 arterial(!!): <19.0 [AH]  1418 Patient in acute hypoxic respiratory failure with uncompensated acidosis.  pO2, Arterial(!): 72.1 [AH]  1419 LDH(!): 429 [AH]  1419 Fibrinogen(!): >800 [AH]  1419 D-Dimer, Quant(!): 2.52 [AH]  1419 Hemoglobin: 15.3 [AH]  1419 WBC: 9.5 [AH]  1419 Patient BUN at 219.  His bicarb is unreasonably low on his ABG.  Patient's respiratory rate is likely secondary to his metabolic acidosis.  Has an anion gap greater than 20.  He continues to have a elevated respiratory rate.  Respiratory therapy is involved in the patient's care.  We will try to lie the patient prone, continued oxygen supplementation and see if we can decrease his drive with correction of his acidosis, fluid rehydration.  I have ordered an amp of bicarb.  We will continue to closely monitor this patient.  BUN(!): 219 [AH]  1600 Patient's Covid 19 is pending but supposedly has an outpatient positive test.  His lactic acid has come back elevated and this is after 3 L of fluid however I suspect that this is secondary to massive dehydration and acute renal failure.  We will continue to give patient fluids.    [AH]  [id number] I spoke with Dr. Dyann Kief who asks  that I consult with Critical care on this patient    [AH]  1722 I spoke with Dr.Yacoub who asks that we consult with [facility] critical care because the patient may need dialysis.    [AH]  [place] spoke with Dr Chase Caller who asks that we get a 2 hour covid test and consult when resulted    [AH]  1805 Patient with worsening respiratory failure  despite prone position and high flow nasal cannula. Patient in respiratory distress and requiring emergent hospitalization. I spoke with the patient's son about his condition. He reports that the patient lives with his grandmother who was recently hospitalized with covid.   [AH]  1931 I spoke with Dr. Chase Caller who has accepted the patient to [facility]. He has requested that I place a central line and give 2L LR bolus. Would like a repeat VBG after bolus and consider biarb drip.    [AH]  2033 I placed a right femoral central line- 2L LR bolus ordered.    [AH]    Clinical Course User Index [AH] Margarita Mail, PA-C       63 year old male here critically ill with Covid virus, respiratory failure, kidney failure, elevated liver enzymes, elevated inflammatory markers, metabolic acidosis secondary to uremia with hypoxic respiratory failure.  Patient was intubated after failure of multiple interventions including pronation, high flow nasal cannula, fluid resuscitation, bicarb bolus.  Patient given a central line for improved access and continued with LR bolus.  I personally reviewed the patient's lab values as above in the ED course.  Multiple consults with admitting providers.  Patient eventually accepted by Dr. Chase Caller to critical care unit.  Question whether the patient may or may not need dialysis show central line placed in the femoral vein.  History gathered from the patient and his son.  I personally reviewed the patient's imaging.  Patient's initial 1 view and repeat 1 view chest show patchy consistent with COVID-19 infection no evidence of edema on second x-ray after fluid resuscitation.  Patient is making urine.  He is critically ill but stabilized for transport by [facility].  Craig Mccoy was evaluated in Emergency Department on 07/12/2019 for the symptoms described in the history of present illness. He was evaluated in the context of the global COVID-19 pandemic, which necessitated  consideration that the patient might be at risk for infection with the SARS-CoV-2 virus that causes COVID-19. Institutional protocols and algorithms that pertain to the evaluation of patients at risk for COVID-19 are in a state of rapid change based on information released by regulatory bodies including the CDC and federal and state organizations. These policies and algorithms were followed during the patient's care in the ED.  Final Clinical Impressions(s) / ED Diagnoses   Final diagnoses:  COVID-19 virus infection  Acute respiratory failure with hypoxia (HCC)  Acute renal failure, unspecified acute renal failure type (HCC)  Elevated liver enzymes  Uremic acidosis  High anion gap metabolic acidosis    ED Discharge Orders    None       Carthel, Jannusch, PA-C 07/10/2019 2215    Fredia Sorrow, MD 07/16/19 [phone]

## 2019-07-11 NOTE — ED Notes (Signed)
Called lab and spoke with [place], Per Jule Economy test still in lab, can run RT.

## 2019-07-11 NOTE — ED Notes (Signed)
Respiratory called and informed order for bipap

## 2019-07-11 NOTE — ED Notes (Signed)
Radiology at bedside

## 2019-07-11 NOTE — ED Notes (Signed)
EDP at bedside preparing for intubation.

## 2019-07-11 NOTE — ED Provider Notes (Signed)
Medical screening examination/treatment/procedure(s) were conducted as a shared visit with non-physician practitioner(s) and myself.  I personally evaluated the patient during the encounter.  EKG Interpretation  Date/Time:  Tuesday July 11 2019 12:12:38 EST Ventricular Rate:  97 PR Interval:    QRS Duration: 89 QT Interval:  332 QTC Calculation: 411 R Axis:   24 Text Interpretation: Sinus rhythm Multiple premature complexes, vent & supraven Posterior infarct, old No previous ECGs available Interpretation limited secondary to artifact Confirmed by Fredia Sorrow [phone]) on 06/25/2019 12:32:15 PM   CRITICAL CARE Performed by: Fredia Sorrow Total critical care time: 30 minutes Critical care time was exclusive of separately billable procedures and treating other patients. Critical care was necessary to treat or prevent imminent or life-threatening deterioration. Critical care was time spent personally by me on the following activities: development of treatment plan with patient and/or surrogate as well as nursing, discussions with consultants, evaluation of patient's response to treatment, examination of patient, obtaining history from patient or surrogate, ordering and performing treatments and interventions, ordering and review of laboratory studies, ordering and review of radiographic studies, pulse oximetry and re-evaluation of patient's condition.   Patient seen by me along with physician assistant.  Patient brought in by EMS.  Patient's significant other girlfriend called for him having difficulty breathing.  Reported that he had very low oxygen saturations around 70%.  Patient's girlfriend states that he has been sick for about a week and then on Thursday had a positive Covid test.  Patient was originally placed on 3 L oxygen.  To get his sats above 80%.  When they arrived as I mentioned it was 77%.  Patient was on nonrebreather sats increased to 93%.  Upon arrival here patient with  increased respiratory rate.  We had difficulty maintaining his sats.  Patient was started on high flow nasal cannula.  Oxygen sats improved significantly into the upper 90s.  However respiratory rate was still up.  We put him in prone position.  And respiratory rate come down to about 30.  Patient still has some degree of confusion.  Labs show marked acidosis.  And marked acute kidney injury.  Renal failure.  Chest x-ray consistent with a multifocal pneumonia.  Consideration shortly after arrival was for possible intubation.  But blood gas showed that his PCO2's were low.  There was some discussion about putting him on closed circuit BiPAP to rest him.  But respiratory felt that he was doing well on the high flow nasal cannula and doing well prone position.  I agree at this point in time patient seems to be doing well.  Patient will require admission.  Formal Covid testing still pending.  Not able to confirm the outpatient test.  Patient is still showing some signs of confusion but he will follow commands.  Says that he does feel better.  I suspect patient will be a medicine admission at this time unless he requires admission.  Medicine was contacted hospitalist wanted critical care to be consulted.  Also patient may very well require dialysis of the admission would be to [facility].  Do not think that it is a critical care admission at this point time.   Fredia Sorrow, MD 07/17/2019 1550

## 2019-07-11 NOTE — ED Notes (Signed)
Attempted to call report. Nurse unavailable to take report at this time.

## 2019-07-11 NOTE — H&P (Signed)
NAME:  Craig Mccoy, MRN:  [id number], DOB:  02/18/1956, LOS: 0 ADMISSION DATE:  06/28/2019, CONSULTATION DATE:  11/17 REFERRING MD:  Rogene Houston, CHIEF COMPLAINT:  Acute respiratory failure   Brief History   Craig Mccoy is a 63 y.o. male who is COVID positive and was transferred from [facility] to [facility] for possible iHD.  History of present illness   Craig Mccoy is a 63 y.o. male who has a PMH including but not limited to hypertension and prior MI per chart review (see "past medical history" for rest).  He presented to [name] 11/17 with dyspnea and respiratory distress.  He had apparently tested positive for COVID on 11/12 at an outside institution and had been in self isolation.  His "lady friend" called EMS 11/17 due to above symptoms.  On EMS arrival, SpO2 was low despite supplemental O2.  In ED, it was 77% before being placed on 15L NRB with improvement to 93%.  Unfortunately, he deteriorated and required intubation.  Repeat COVID testing returned as positive.  He was also found to have multiple metabolic derangements including marked renal failure (SCr 14.63 with BUN 219), met acidosis + resp alkalosis (7.28, < 19, 72), PCT 25.  Due to his potential to require iHD, he was transferred to [facility] for further evaluation and management.   Past Medical History  MI, HTN  Significant Hospital Events   11/17 >> Transferred from [facility] to [place]:  Nephrology pending   Procedures:  ETT 11/17  Significant Diagnostic Tests:  CXR 11/17 bilateral opacities   Micro Data:  Blood 11/17 >>  Sputum 11/17 >>  SARS CoV2 11/17 >> positive  Antimicrobials:  Ceftriaxone 11/17 >  Azithromycin 11/17 >    Interim history/subjective:  Intubated and sedated, appears comfortable   Objective   Blood pressure (!) 143/93, pulse (!) 111, temperature 98.2 F (36.8 C), temperature source Rectal, resp. rate 16, height '5\' 6"'$  (1.676 m), weight 81.6 kg, SpO2 98 %.    Vent Mode: PRVC FiO2 (%):  [100 %] 100 %  Set Rate:  [16 bmp] 16 bmp Vt Set:  [550 mL] 550 mL PEEP:  [5 cmH20] 5 cmH20 Plateau Pressure:  [24 cmH20] 24 cmH20   Intake/Output Summary (Last 24 hours) at 07/02/2019 1929 Last data filed at 07/08/2019 1730 Gross per 24 hour  Intake 4552.08 ml  Output -  Net 4552.08 ml   Filed Weights   07/16/2019 1214  Weight: 81.6 kg    Examination: General: Adult male on mechanical ventilation, in NAD HEENT: ETT, MM pink/moist, PERRL,  Neuro: Sedated and on mechanical ventilation  CV: s1s2 regular rate and rhythm, no murmur, rubs, or gallops,  PULM: Bilateral course breath sounds, tolerated ventilator well GI: soft, bowel sounds active in all 4 quadrants, non-tender, non-distended Extremities: warm/dry, no edema  Skin: no rashes or lesions  Resolved Hospital Problem list     Assessment & Plan:   Acute Hypoxic Respiratory Failure  -In the setting of COVID pneumonia, tested positive for COVID 11/12 in the community, reamins positive on admission 11/17 P: Continue ventilator support with lung protective strategies  Wean PEEP and FiO2 for sats greater than 90%. Head of bed elevated 30 degrees. Plateau pressures less than 30 cm H20.  Follow intermittent chest x-ray and ABG.   Ensure adequate pulmonary hygiene  Follow cultures  VAP bundle in place   COVID pneumonia  Lactic acidosis  -LDH 429, Ferritin 2,490, CRP 38, Lactic 3.8, Procalcitonin 25.01 P: Vent  support as above  Start Decadron '6mg'$  IV x10days Start Remdesiver  Consider prone positioning  Due to severely decreased creatinine clearance Subcu heparin DVT prophylaxes  Trend lactate  Cautious IV hydration   Metabolic Acidosis with Concomitant Respiratory Alkalosis  -ABG 7.281 / <19.0 / 721.1 / 12.1 -Likely secondary to acute renal failure  P: Repeat ABG  Trend CMP Consider repeat bicard push and or drip if acidosis persist  Treatment for renal failure as below   Acute renal failure  -On admission patient was seen  with acute renal failure with a creatine of 14.63, BUN 219, and GFR of 4. No prior labwork for comparison  P: Ad[facility] to [facility] for possible need for dialysis  Avoid nephrotoxins Trend CMP Ensure adequate renal perfusion  Monitor urine output Consult nephrology   Hx of hypertension -Chart history of hypertension however medication review reveals no chronic antihypertensives  P: Monitor BP  Hyperglycemia -No reported history of diabetes  P: Obtain hemoglobin A1C SSI Hypoglycemia protocol as needed    Best practice:  Diet: NPO Pain/Anxiety/Delirium protocol (if indicated): PAD protocol  VAP protocol (if indicated): In place  DVT prophylaxis: Subcu heparin GI prophylaxis: PPI Glucose control: SSI Mobility: BD  Code Status: Full Family Communication: Will update  Disposition: ICU   Labs   CBC: Recent Labs  Lab 07/24/2019 1220  WBC 9.5  NEUTROABS 8.4*  HGB 15.3  HCT 48.2  MCV 72.9*  PLT 016    Basic Metabolic Panel: Recent Labs  Lab 07/05/2019 1220 07/06/2019 1357  NA 143  --   K 4.9  --   CL 102  --   CO2 13*  --   GLUCOSE 211*  --   BUN 219*  --   CREATININE 14.63*  --   CALCIUM 9.1  --   MG  --  4.2*   GFR: Estimated Creatinine Clearance: 5.1 mL/min (A) (by C-G formula based on SCr of 14.63 mg/dL (H)). Recent Labs  Lab 07/05/2019 1220 07/17/2019 1357 07/24/2019 1600  PROCALCITON 25.01  --   --   WBC 9.5  --   --   LATICACIDVEN  --  3.8* 3.7*    Liver Function Tests: Recent Labs  Lab 07/10/2019 1220  AST 76*  ALT 65*  ALKPHOS 73  BILITOT 0.9  PROT 8.8*  ALBUMIN 3.2*   No results for input(s): LIPASE, AMYLASE in the last 168 hours. Recent Labs  Lab 07/04/2019 1357  AMMONIA 30    ABG    Component Value Date/Time   PHART 7.281 (L) 06/30/2019 1255   PCO2ART <19.0 (LL) 07/05/2019 1255   PO2ART 72.1 (L) 07/23/2019 1255   HCO3 12.1 (L) 07/01/2019 1255   ACIDBASEDEF 17.9 (H) 07/21/2019 1255   O2SAT 90.3 07/06/2019 1255     Coagulation  Profile: No results for input(s): INR, PROTIME in the last 168 hours.  Cardiac Enzymes: No results for input(s): CKTOTAL, CKMB, CKMBINDEX, TROPONINI in the last 168 hours.  HbA1C: No results found for: HGBA1C  CBG: Recent Labs  Lab 07/01/2019 1458  GLUCAP 176*    Review of Systems:   Unable to obtain secondary to unresponsiveness and intubation    Past Medical History  He,  has a past medical history of Hypertension and MI (myocardial infarction) ([place]).   Surgical History    Past Surgical History:  Procedure Laterality Date  . COLONOSCOPY N/A 06/22/2014   Procedure: COLONOSCOPY;  Surgeon: Rogene Houston, MD;  Location: [facility];  Service:  Endoscopy;  Laterality: N/A;  1230 - moved to 2:15 - Ann to notify     Social History   reports that he has never smoked. He has never used smokeless tobacco. He reports current alcohol use. He reports previous drug use.   Family History   His family history is not on file.   Allergies No Known Allergies   Home Medications  Prior to Admission medications   Medication Sig Start Date End Date Taking? Authorizing Provider  benzonatate (TESSALON) 100 MG capsule Take 100-200 mg by mouth 3 (three) times daily as needed for cough.   Yes [provider]  EDARBYCLOR 40-12.5 MG TABS Take 1 tablet by mouth daily. 06/01/19  Yes [provider]  pravastatin (PRAVACHOL) 10 MG tablet Take 10 mg by mouth daily. 05/03/19  Yes [provider]  sildenafil (VIAGRA) 50 MG tablet Take 50 mg by mouth daily as needed for erectile dysfunction.   Yes [provider]     Critical care time:    Performed by: Johnsie Cancel   Total critical care time: 40 minutes  Critical care time was exclusive of separately billable procedures and treating other patients.  Critical care was necessary to treat or prevent imminent or life-threatening deterioration.  Critical care was time spent personally by me on the following  activities: development of treatment plan with patient and/or surrogate as well as nursing, discussions with consultants, evaluation of patient's response to treatment, examination of patient, obtaining history from patient or surrogate, ordering and performing treatments and interventions, ordering and review of laboratory studies, ordering and review of radiographic studies, pulse oximetry and re-evaluation of patient's condition.  Johnsie Cancel, NP-C  Pulmonary & Critical Care After hours pager: [phone]. 06/29/2019, 8:15 PM

## 2019-07-11 NOTE — ED Notes (Signed)
CRITICAL VALUE ALERT  Critical Value:  Covid Positive  Date & Time Notied:  07/19/2019 1807  Provider Notified: Margarita Mail, PA  Orders Received/Actions taken: EDP notified

## 2019-07-11 NOTE — ED Triage Notes (Signed)
Pt tested positive for Covid on Thursday. Pt brought to ED via [place], pt was placed on 3L of O2 and unable to get sats above 80%. Pt sats upon arrival 77% pt placed on non rebreather, sats increased to 93%.

## 2019-07-12 ENCOUNTER — Inpatient Hospital Stay (HOSPITAL_COMMUNITY): Payer: BC Managed Care – PPO

## 2019-07-12 DIAGNOSIS — N179 Acute kidney failure, unspecified: Secondary | ICD-10-CM

## 2019-07-12 DIAGNOSIS — J9601 Acute respiratory failure with hypoxia: Secondary | ICD-10-CM

## 2019-07-12 LAB — APTT: aPTT: 29 seconds (ref 24–36)

## 2019-07-12 LAB — GLUCOSE, CAPILLARY
Glucose-Capillary: 127 mg/dL — ABNORMAL HIGH (ref 70–99)
Glucose-Capillary: 127 mg/dL — ABNORMAL HIGH (ref 70–99)
Glucose-Capillary: 146 mg/dL — ABNORMAL HIGH (ref 70–99)
Glucose-Capillary: 178 mg/dL — ABNORMAL HIGH (ref 70–99)
Glucose-Capillary: 198 mg/dL — ABNORMAL HIGH (ref 70–99)
Glucose-Capillary: 260 mg/dL — ABNORMAL HIGH (ref 70–99)

## 2019-07-12 LAB — RENAL FUNCTION PANEL
Albumin: 1.9 g/dL — ABNORMAL LOW (ref 3.5–5.0)
Albumin: 2.4 g/dL — ABNORMAL LOW (ref 3.5–5.0)
Anion gap: 18 — ABNORMAL HIGH (ref 5–15)
Anion gap: 15 (ref 5–15)
BUN: 150 mg/dL — ABNORMAL HIGH (ref 8–23)
BUN: 92 mg/dL — ABNORMAL HIGH (ref 8–23)
CO2: 21 mmol/L — ABNORMAL LOW (ref 22–32)
CO2: 21 mmol/L — ABNORMAL LOW (ref 22–32)
Calcium: 7.5 mg/dL — ABNORMAL LOW (ref 8.9–10.3)
Calcium: 7.8 mg/dL — ABNORMAL LOW (ref 8.9–10.3)
Chloride: 108 mmol/L (ref 98–111)
Chloride: 109 mmol/L (ref 98–111)
Creatinine, Ser: 6.77 mg/dL — ABNORMAL HIGH (ref 0.61–1.24)
Creatinine, Ser: 4.29 mg/dL — ABNORMAL HIGH (ref 0.61–1.24)
GFR calc Af Amer: 9 mL/min — ABNORMAL LOW (ref >60)
GFR calc Af Amer: 16 mL/min — ABNORMAL LOW (ref >60)
GFR calc non Af Amer: 8 mL/min — ABNORMAL LOW (ref >60)
GFR calc non Af Amer: 14 mL/min — ABNORMAL LOW (ref >60)
Glucose, Bld: 205 mg/dL — ABNORMAL HIGH (ref 70–99)
Glucose, Bld: 152 mg/dL — ABNORMAL HIGH (ref 70–99)
Phosphorus: 5.4 mg/dL — ABNORMAL HIGH (ref 2.5–4.6)
Phosphorus: 6.5 mg/dL — ABNORMAL HIGH (ref 2.5–4.6)
Potassium: 4 mmol/L (ref 3.5–5.1)
Potassium: 5 mmol/L (ref 3.5–5.1)
Sodium: 147 mmol/L — ABNORMAL HIGH (ref 135–145)
Sodium: 145 mmol/L (ref 135–145)

## 2019-07-12 LAB — CBC
HCT: 40.9 % (ref 39.0–52.0)
Hemoglobin: 13.7 g/dL (ref 13.0–17.0)
MCH: 23.7 pg — ABNORMAL LOW (ref 26.0–34.0)
MCHC: 33.5 g/dL (ref 30.0–36.0)
MCV: 70.8 fL — ABNORMAL LOW (ref 80.0–100.0)
Platelets: 269 10*3/uL (ref 150–400)
RBC: 5.78 MIL/uL (ref 4.22–5.81)
RDW: 15.9 % — ABNORMAL HIGH (ref 11.5–15.5)
WBC: 5.4 10*3/uL (ref 4.0–10.5)
nRBC: 0 % (ref 0.0–0.2)

## 2019-07-12 LAB — BASIC METABOLIC PANEL
Anion gap: 19 — ABNORMAL HIGH (ref 5–15)
BUN: 172 mg/dL — ABNORMAL HIGH (ref 8–23)
CO2: 16 mmol/L — ABNORMAL LOW (ref 22–32)
Calcium: 7.3 mg/dL — ABNORMAL LOW (ref 8.9–10.3)
Chloride: 112 mmol/L — ABNORMAL HIGH (ref 98–111)
Creatinine, Ser: 8.37 mg/dL — ABNORMAL HIGH (ref 0.61–1.24)
GFR calc Af Amer: 7 mL/min — ABNORMAL LOW (ref >60)
GFR calc non Af Amer: 6 mL/min — ABNORMAL LOW (ref >60)
Glucose, Bld: 270 mg/dL — ABNORMAL HIGH (ref 70–99)
Potassium: 4.7 mmol/L (ref 3.5–5.1)
Sodium: 147 mmol/L — ABNORMAL HIGH (ref 135–145)

## 2019-07-12 LAB — MAGNESIUM: Magnesium: 3 mg/dL — ABNORMAL HIGH (ref 1.7–2.4)

## 2019-07-12 LAB — ECHOCARDIOGRAM LIMITED
Height: 66 in
Weight: 2821.89 oz

## 2019-07-12 LAB — URINALYSIS, ROUTINE W REFLEX MICROSCOPIC
Bilirubin Urine: NEGATIVE
Glucose, UA: NEGATIVE mg/dL
Ketones, ur: NEGATIVE mg/dL
Leukocytes,Ua: NEGATIVE
Nitrite: NEGATIVE
Protein, ur: 30 mg/dL — AB
Specific Gravity, Urine: 1.014 (ref 1.005–1.030)
pH: 5 (ref 5.0–8.0)

## 2019-07-12 LAB — POCT I-STAT 7, (LYTES, BLD GAS, ICA,H+H)
Acid-base deficit: 5 mmol/L — ABNORMAL HIGH (ref 0.0–2.0)
Bicarbonate: 17.9 mmol/L — ABNORMAL LOW (ref 20.0–28.0)
Calcium, Ion: 1.04 mmol/L — ABNORMAL LOW (ref 1.15–1.40)
HCT: 36 % — ABNORMAL LOW (ref 39.0–52.0)
Hemoglobin: 12.2 g/dL — ABNORMAL LOW (ref 13.0–17.0)
O2 Saturation: 90 %
Patient temperature: 34.5
Potassium: 3.9 mmol/L (ref 3.5–5.1)
Sodium: 145 mmol/L (ref 135–145)
TCO2: 19 mmol/L — ABNORMAL LOW (ref 22–32)
pCO2 arterial: 24.7 mmHg — ABNORMAL LOW (ref 32.0–48.0)
pH, Arterial: 7.457 — ABNORMAL HIGH (ref 7.350–7.450)
pO2, Arterial: 46 mmHg — ABNORMAL LOW (ref 83.0–108.0)

## 2019-07-12 LAB — POCT ACTIVATED CLOTTING TIME
Activated Clotting Time: 180 seconds
Activated Clotting Time: 136 seconds
Activated Clotting Time: 158 seconds
Activated Clotting Time: 169 seconds
Activated Clotting Time: 175 seconds
Activated Clotting Time: 186 seconds
Activated Clotting Time: 191 seconds

## 2019-07-12 LAB — URINE CULTURE: Culture: NO GROWTH

## 2019-07-12 LAB — TROPONIN I (HIGH SENSITIVITY)
Troponin I (High Sensitivity): 1504 ng/L (ref <18)
Troponin I (High Sensitivity): 1664 ng/L (ref <18)
Troponin I (High Sensitivity): 1604 ng/L (ref <18)

## 2019-07-12 LAB — TRIGLYCERIDES: Triglycerides: 554 mg/dL — ABNORMAL HIGH (ref <150)

## 2019-07-12 LAB — HEMOGLOBIN A1C
Hgb A1c MFr Bld: 7 % — ABNORMAL HIGH (ref 4.8–5.6)
Mean Plasma Glucose: 154.2 mg/dL

## 2019-07-12 LAB — PHOSPHORUS: Phosphorus: 8.3 mg/dL — ABNORMAL HIGH (ref 2.5–4.6)

## 2019-07-12 LAB — HIV ANTIBODY (ROUTINE TESTING W REFLEX): HIV Screen 4th Generation wRfx: NONREACTIVE

## 2019-07-12 LAB — SODIUM, URINE, RANDOM: Sodium, Ur: 81 mmol/L

## 2019-07-12 LAB — MRSA PCR SCREENING: MRSA by PCR: NEGATIVE

## 2019-07-12 LAB — CREATININE, URINE, RANDOM: Creatinine, Urine: 101.23 mg/dL

## 2019-07-12 LAB — PROTIME-INR
INR: 1.3 — ABNORMAL HIGH (ref 0.8–1.2)
Prothrombin Time: 16.4 seconds — ABNORMAL HIGH (ref 11.4–15.2)

## 2019-07-12 MED ORDER — MIDAZOLAM HCL 2 MG/2ML IJ SOLN
4.0000 mg | Freq: Once | INTRAMUSCULAR | Status: AC
  Administered 2019-07-11: 4 mg via INTRAVENOUS

## 2019-07-12 MED ORDER — PHENYLEPHRINE HCL-NACL 40-0.9 MG/250ML-% IV SOLN
0.0000 ug/min | INTRAVENOUS | Status: DC
  Administered 2019-07-12 (×2): 120 ug/min via INTRAVENOUS
  Administered 2019-07-13: 90 ug/min via INTRAVENOUS
  Administered 2019-07-13: 50 ug/min via INTRAVENOUS
  Administered 2019-07-14: 20 ug/min via INTRAVENOUS
  Filled 2019-07-12 (×6): qty 250

## 2019-07-12 MED ORDER — FENTANYL 2500MCG IN NS 250ML (10MCG/ML) PREMIX INFUSION
0.0000 ug/h | INTRAVENOUS | Status: DC
  Administered 2019-07-12: 20:00:00 250 ug/h via INTRAVENOUS
  Administered 2019-07-13: 200 ug/h via INTRAVENOUS
  Administered 2019-07-13: 150 ug/h via INTRAVENOUS
  Administered 2019-07-14: 200 ug/h via INTRAVENOUS
  Administered 2019-07-14: 400 ug/h via INTRAVENOUS
  Administered 2019-07-15: 225 ug/h via INTRAVENOUS
  Administered 2019-07-15 (×2): 200 ug/h via INTRAVENOUS
  Administered 2019-07-16: 300 ug/h via INTRAVENOUS
  Administered 2019-07-16: 150 ug/h via INTRAVENOUS
  Administered 2019-07-17: 200 ug/h via INTRAVENOUS
  Filled 2019-07-12 (×12): qty 250

## 2019-07-12 MED ORDER — SODIUM BICARBONATE 8.4 % IV SOLN
INTRAVENOUS | Status: AC
  Filled 2019-07-12: qty 50

## 2019-07-12 MED ORDER — PHENYLEPHRINE HCL-NACL 10-0.9 MG/250ML-% IV SOLN
0.0000 ug/min | INTRAVENOUS | Status: DC
  Administered 2019-07-12 (×3): 100 ug/min via INTRAVENOUS
  Administered 2019-07-12: 06:00:00 20 ug/min via INTRAVENOUS
  Filled 2019-07-12 (×2): qty 500
  Filled 2019-07-12: qty 250

## 2019-07-12 MED ORDER — CHLORHEXIDINE GLUCONATE CLOTH 2 % EX PADS
6.0000 | MEDICATED_PAD | Freq: Every day | CUTANEOUS | Status: DC
  Administered 2019-07-12 – 2019-07-16 (×4): 6 via TOPICAL

## 2019-07-12 MED ORDER — ALBUMIN HUMAN 25 % IV SOLN
25.0000 g | Freq: Once | INTRAVENOUS | Status: AC
  Administered 2019-07-12: 25 g via INTRAVENOUS
  Filled 2019-07-12: qty 50

## 2019-07-12 MED ORDER — DEXMEDETOMIDINE HCL IN NACL 400 MCG/100ML IV SOLN
0.0000 ug/kg/h | INTRAVENOUS | Status: DC
  Administered 2019-07-12: 22:00:00 0.9 ug/kg/h via INTRAVENOUS
  Administered 2019-07-12: 17:00:00 0.6 ug/kg/h via INTRAVENOUS
  Administered 2019-07-13 (×2): 0.8 ug/kg/h via INTRAVENOUS
  Administered 2019-07-13: 1 ug/kg/h via INTRAVENOUS
  Administered 2019-07-13: 0.9 ug/kg/h via INTRAVENOUS
  Administered 2019-07-14: 01:00:00 1.1 ug/kg/h via INTRAVENOUS
  Filled 2019-07-12 (×2): qty 100
  Filled 2019-07-12: qty 200
  Filled 2019-07-12 (×4): qty 100

## 2019-07-12 NOTE — Progress Notes (Signed)
Initial Nutrition Assessment RD working remotely.  DOCUMENTATION CODES:   Not applicable  INTERVENTION:   Recommend begin enteral nutrition via OG tube:   Vital High Protein at 75 ml/h (1800 ml per day)   Provides 1800 kcal, 158 gm protein, 1505 ml free water daily  NUTRITION DIAGNOSIS:   Increased nutrient needs related to acute illness(COVID PNA) as evidenced by estimated needs.  GOAL:   Patient will meet greater than or equal to 90% of their needs  MONITOR:   Vent status, Labs, I & O's  REASON FOR ASSESSMENT:   Ventilator    ASSESSMENT:   63 yo male admitted with COVID PNA, AKI. PMH includes HTN, MI.   Currently requiring CRRT.  Patient is intubated on ventilator support. MV: 9.5 L/min Temp (24hrs), Avg:95.9 F (35.5 C), Min:93.6 F (34.2 C), Max:98.2 F (36.8 C)  Propofol: 2 ml/hr providing 53 kcal from lipid.  Labs reviewed. Triglycerides 554 (H), BUN 150 (H), creatinine 6.77 (H), phosphorus 5.4 (H) CBG's: [id number]  Medications reviewed and include decadron, novolog, neosynephrine.   No recent weight encounters available for review.  NUTRITION - FOCUSED PHYSICAL EXAM:  deferred  Diet Order:   Diet Order            Diet NPO time specified  Diet effective now              EDUCATION NEEDS:   Not appropriate for education at this time  Skin:  Skin Assessment: Reviewed RN Assessment  Last BM:  no BM documented  Height:   Ht Readings from Last 1 Encounters:  07/14/2019 5\' 6"  (1.676 m)    Weight:   Wt Readings from Last 1 Encounters:  06/29/2019 80 kg    Ideal Body Weight:  64.5 kg  BMI:  Body mass index is 28.47 kg/m.  Estimated Nutritional Needs:   Kcal:  1740  Protein:  140-160 gm  Fluid:  >/= 1.8 L    Molli Barrows, RD, LDN, [place] Pager [phone] After Hours Pager [phone]

## 2019-07-12 NOTE — Procedures (Addendum)
Hemodialysis Catheter Insertion Procedure Note Craig Mccoy [id number] 08-01-1956  Procedure: Insertion of Hemodialysis Catheter Indications: Dialysis Access   Procedure Details Consent: Risks of procedure as well as the alternatives and risks of each were explained to the (patient/caregiver).  Consent for procedure obtained. Time Out: Verified patient identification, verified procedure, site/side was marked, verified correct patient position, special equipment/implants available, medications/allergies/relevent history reviewed, required imaging and test results available.  Performed  Maximum sterile technique was used including antiseptics, cap, gloves, gown, hand hygiene, mask and sheet. Skin prep: Chlorhexidine; local anesthetic administered Triple lumen hemodialysis catheter was inserted into right internal jugular vein using the Seldinger technique.  Evaluation Blood flow good Complications: No apparent complications Patient did tolerate procedure well. Chest X-ray ordered to verify placement.  CXR: pending.  Johnsie Cancel, NP-C [place] [facility] After hours pager: [phone]. 07/12/2019, 12:49 AM

## 2019-07-12 NOTE — Progress Notes (Signed)
[facility] Progress Note Patient Name: Craig Mccoy DOB: 1956-06-05 MRN: [id number]   Date of Service  07/12/2019  HPI/Events of Note  Metabolic acidosis with PH 7.18  eICU Interventions  Pt is receiving hemodialysis.        [name] 07/12/2019, 3:37 AM

## 2019-07-12 NOTE — Progress Notes (Signed)
  Echocardiogram 2D Echocardiogram has been performed.  Burnett Kanaris 07/12/2019, 8:49 AM

## 2019-07-12 NOTE — Progress Notes (Signed)
[facility] Progress Note    Assessment/ Plan:   1. AKI - secondary to sepsis + COVID with metabolic acidosis with a pH of 7.18.  Continue CRRT all 4K bath, intra-circuit heparin- no UOP so far today.  Stop bicarb gtt as acidosis getting better.  2. COVID PNA being tx with Decadron and Remdesivir--> intubated, on azithro/ CTX as well, per PCCM   3. HTN - MAR doesn't show any home antihypertensives  Subjective:    Remains critically ill in ICU.     Objective:   BP 105/81   Pulse 63   Temp (!) 93.6 F (34.2 C)   Resp 14   Ht 5' 6" (1.676 m)   Wt 80 kg   SpO2 94%   BMI 28.47 kg/m   Intake/Output Summary (Last 24 hours) at 07/12/2019 0843 Last data filed at 07/12/2019 0800 Gross per 24 hour  Intake 6940.47 ml  Output 1079 ml  Net 5861.47 ml   Weight change:   Physical Exam:  Not examined d/t preservation of PPE, relying on primary exam and RN assessment  Imaging: Dg Chest Port 1 View  Result Date: 07/12/2019 CLINICAL DATA:  Respiratory failure.  Intubated patient. EXAM: PORTABLE CHEST 1 VIEW COMPARISON:  07/12/2019 FINDINGS: Endotracheal tube is 5.6 cm above the carina. Right jugular central line with the tip in the upper SVC region. Nasogastric tube tip terminates near the GE junction. Nasogastric tube side hole continues to be in the distal esophagus region. Again noted is elevation of left hemidiaphragm. Bilateral airspace densities, right side greater than left. Airspace disease has minimally changed. Heart size is within normal limits. Negative for a pneumothorax. IMPRESSION: 1. Minimal change in the bilateral airspace disease. Findings are compatible with pneumonia. 2. Nasogastric tube tip is near the GE junction and recommend advancing the tube if possible. 3. Endotracheal tube is stable in position. Electronically Signed   By: Adam  Henn M.D.   On: 07/12/2019 08:39   Dg Chest Port 1 View  Result Date: 07/12/2019 CLINICAL DATA:  63-year-old male  central line placement. EXAM: PORTABLE CHEST 1 VIEW COMPARISON:  07/07/2019. FINDINGS: Portable AP semi upright view at 0029 hours. Stable endotracheal tube tip at the level the clavicles. Enteric tube courses to the abdomen in the midline, side hole is at the level of the distal thoracic esophagus. Right IJ approach central line placed, tip at the level of the SVC. No pneumothorax. Stable cardiac size and mediastinal contours. Stable low lung volumes with elevated left hemidiaphragm. Coarse bilateral basilar predominant pulmonary interstitial opacity. Negative visible bowel gas pattern. IMPRESSION: 1. Right IJ central line placed, tip at the level of the SVC. No pneumothorax. 2. Enteric tube side hole remains in the distal thoracic esophagus. Advance 6 cm to ensure side hole placement within the stomach. 3. Otherwise stable lines and tubes. Stable ventilation. Electronically Signed   By: H  Hall M.D.   On: 07/12/2019 00:49   Dg Chest Port 1 View  Result Date: 06/30/2019 CLINICAL DATA:  Post intubation EXAM: PORTABLE CHEST 1 VIEW COMPARISON:  Radiograph 06/29/2019 FINDINGS: Endotracheal tube terminates in the mid trachea, approximately 5 cm from the carina. A transesophageal tube tip is distal to the GE junction however the side port remains in the distal thoracic esophagus and should be advanced for optimal function. Redemonstrated elevation of the left hemidiaphragm with diffuse bilateral interstitial and airspace opacities. Cardiomediastinal contours are grossly unchanged from prior accounting for differences in rotation and technique. IMPRESSION: 1. Endotracheal   tube terminates in the mid trachea, approximately 5 cm from the carina. 2. A transesophageal tube tip is distal to the GE junction however the side port remains in the distal thoracic esophagus and tube should be advanced 3-4 cm for optimal function. 3. Unchanged diffuse bilateral interstitial and airspace opacities. Electronically Signed   By:  Price  DeHay M.D.   On: 06/26/2019 18:44   Dg Chest Portable 1 View  Result Date: 06/28/2019 CLINICAL DATA:  Shortness of breath, COVID-19 EXAM: PORTABLE CHEST 1 VIEW COMPARISON:  None. FINDINGS: Low volume AP portable examination with elevation of the left hemidiaphragm and diffuse bilateral interstitial and heterogeneous airspace opacity. The heart and mediastinum are unremarkable. IMPRESSION: Low volume AP portable examination with elevation of the left hemidiaphragm and diffuse bilateral interstitial and heterogeneous airspace opacity. Findings are consistent with reported diagnosis of COVID-19. Electronically Signed   By: Alex  Bibbey M.D.   On: 07/17/2019 13:15    Labs: BMET Recent Labs  Lab 07/23/2019 1220 07/23/2019 2246 07/12/19 0117 07/12/19 0324 07/12/19 0547  NA 143 141 147* 147* 145  K 4.9 4.7 4.7 4.0 3.9  CL 102  --  112* 108  --   CO2 13*  --  16* 21*  --   GLUCOSE 211*  --  270* 205*  --   BUN 219*  --  172* 150*  --   CREATININE 14.63*  --  8.37* 6.77*  --   CALCIUM 9.1  --  7.3* 7.5*  --   PHOS  --   --  8.3* 5.4*  --    CBC Recent Labs  Lab 07/19/2019 1220 07/20/2019 2246 07/12/19 0324 07/12/19 0547  WBC 9.5  --  5.4  --   NEUTROABS 8.4*  --   --   --   HGB 15.3 12.6* 13.7 12.2*  HCT 48.2 37.0* 40.9 36.0*  MCV 72.9*  --  70.8*  --   PLT 352  --  269  --     Medications:    . chlorhexidine gluconate (MEDLINE KIT)  15 mL Mouth Rinse BID  . Chlorhexidine Gluconate Cloth  6 each Topical Daily  . dexamethasone (DECADRON) injection  6 mg Intravenous Daily  . heparin  7,500 Units Subcutaneous Q8H  . insulin aspart  0-9 Units Subcutaneous Q4H  . mouth rinse  15 mL Mouth Rinse 10 times per day  . pantoprazole (PROTONIX) IV  40 mg Intravenous QHS       , MD 07/12/2019, 8:43 AM  

## 2019-07-12 NOTE — Progress Notes (Signed)
[facility] Progress Note Patient Name: Craig Mccoy DOB: May 26, 1956 MRN: [id number]   Date of Service  07/12/2019  HPI/Events of Note  Ventilator dyssynchrony, soft blood pressure  eICU Interventions  Phenylephrine infusion, Albumin 25 %  25 gm iv bolus, Fentanyl infusion.        [name] 07/12/2019, 5:04 AM

## 2019-07-12 NOTE — Progress Notes (Signed)
[facility] Progress Note Patient Name: Craig Mccoy DOB: November 30, 1955 MRN: [id number]   Date of Service  07/12/2019  HPI/Events of Note  Troponin 1504  eICU Interventions  Troponin x 2, Echo in a.m., 12 lead EKG        Meir Elwood U [name] 07/12/2019, 3:29 AM

## 2019-07-12 NOTE — Progress Notes (Signed)
NAME:  Craig Mccoy, MRN:  [id number], DOB:  01/23/1956, LOS: 1 ADMISSION DATE:  07/24/2019, CONSULTATION DATE:  11/17 REFERRING MD:  Rogene Houston, CHIEF COMPLAINT:  Acute respiratory failure   Brief History   Craig Mccoy is a 63 yo male, COVID positive, who was transferred from [facility] to [facility] for iHD.   History of present illness   Craig Mccoy a 63 y.o.malewho has a PMH including but not limited to hypertension and prior MI per chart review (see "past medical history" for rest). Hepresented to [name] 11/17 with dyspnea and respiratory distress. He had apparently tested positive for COVID on 11/12 at an outside institution and had been in self isolation. His "lady friend" called EMS 11/17 due to above symptoms.  On EMS arrival, SpO2 was low despite supplemental O2. In ED, it was 77% before being placed on 15L NRB with improvement to 93%. Unfortunately, he deteriorated and required intubation. Repeat COVID testing returned as positive. He was also found to have multiple metabolic derangements including marked renal failure (SCr 14.63 with BUN 219), met acidosis + resp alkalosis (7.28, <19, 72), PCT 25.  Due to his potential to require iHD, he was transferred to [facility] for further evaluation and management.   Past Medical History  MI, HTN  Significant Hospital Events   11/17 - transferred to [facility] from [place]:  nephrology  Procedures:  ETT 11/17.  Echo - 11/18  Significant Diagnostic Tests:  CXR - 11/17 - bilateral opacities HS-trop - 1604>>1504 Ferritin 2490 CRP - 38 PCT - 25.01 D-Dimer 2.52, Fibrinogen > 800.  PT -16.4; INR 1.3. PTT 29.   Micro Data:  Blood Cx - 11/17 -  Sputum Cx 11/17 -  Urine Cx 11/17 -  MRSA pcr - negative COVID 11/17 - positive  Antimicrobials:  CTX - 11/17 -  Azithro - 11/17 -    Interim history/subjective:  Pt intubated and sedated.   Objective   Blood pressure 110/81, pulse 66, temperature (!) 94.5 F (34.7 C), resp. rate  18, height '5\' 6"'$  (1.676 m), weight 80 kg, SpO2 91 %.    Vent Mode: PRVC FiO2 (%):  [60 %-100 %] 80 % Set Rate:  [14 bmp-16 bmp] 14 bmp Vt Set:  [510 mL-550 mL] 510 mL PEEP:  [5 cmH20-8 cmH20] 8 cmH20 Plateau Pressure:  [24 cmH20-26 cmH20] 25 cmH20   Intake/Output Summary (Last 24 hours) at 07/12/2019 0942 Last data filed at 07/12/2019 0900 Gross per 24 hour  Intake 7191.04 ml  Output 1079 ml  Net 6112.04 ml   Filed Weights   07/05/2019 1214 07/22/2019 2244  Weight: 81.6 kg 80 kg    Examination: General: sedated, intubated.  Does not respond to voice.  Underneath Bear hugger warming device.  HENT: intubated. Normal oral mucosa.  Lungs: crackles heard in left side anteriorly.  Cardiovascular: RRR.  1+ radial pulse.  Abdomen: does not respond to palpation.  Extremities: no edema.   Neuro: unable to assess neuro status dt pt condition.   Resolved Hospital Problem list     Assessment & Plan:  ASSESSMENT / PLAN:  PULMONARY A:  Acute hypoxic respiratory failure secondary to COVID positive pneumonia. Pt currently on ventilator, sedated. CXR shows low lung volume and b/l airspace opacity consistent with covid PNA.   P:   - continue Abx: azithro (11/17 - ) and CTX (11-17 - ) - f/u sputum cx - continue decadron '6mg'$  daily (11/17-) - continue remdesivir - (11/17-) - continue respiratory  support as needed.    NEUROLOGIC A:   Currently sedated.   P:   - continue sedation with propofol while on vent  VASCULAR A:   Currently normotensive. On phenylephrine gtt currently. On edarbyclor 40-12.5 at home.   P:  - continue phenylephrine gtt - hold home meds - continue to monitor  CARDIAC STRUCTURAL A: Previous hx of MI.  Pt was found to have elevated HS-Trop of 1604 (most recently 1504). Unable to assess for chest pain. No ST changes on EKG. Echo was ordered for this morning. Triglyceride level elevated (397>554).    P: - f/u Echo.  - consider trending troponin further.  -  monitor Triglyceride daily, consider decreasing/discontinuing propofol if severely elevated.   CARDIAC ELECTRICAL A: No ST changes seen on EKG.   P: - on continuous cardiac monitoring.   INFECTIOUS A:   COVID positive with symptoms/labs/imaging consistent with COVID pneumonia.  Currently intubated d/t acute respiratory distress and on pressors for BP support, hyperthermic blanket for low temperature. Sputum/blood/urine cx pending. Receiving abx and steroid/remdesivir.   P:   - continue abx - continue steroid/remdesivir - monitor daily labs: cbc, renal panel, mg, triglyceride - continue bear hugger for hypothermia.   RENAL A:  AKI - No hx of renal disease known. Pt presented with Cr level of 87.68, metabolic acidosis w bicarb of 13 with an AG of >20, BUN of 219. Nephrology consulted and the patient was started on CRRT with improvement in kidney function/electrolytes. Pt has foley cath and UOP was 775 yesterday.   P:  - f/u nephro recs.  - continue CRRT.   ELECTROLYTES A:  Deranged 2/2 severe AKI. Should improve with CRRT.   P: - continue to monitor with daily renal function panel   GASTROINTESTINAL A:   No GI concerns currently   P:   - continue pantoprazole daily for GI prophylaxis.   HEMATOLOGIC A:  Hgb decreased from admission but still > 12.    P:  - continue to monitor w/ daily cbc.  - PRBC for hgb </= 6.9gm%    - exceptions are   -  if ACS susepcted/confirmed then transfuse for hgb </= 8.0gm%,  or    -  active bleeding with hemodynamic instability, then transfuse regardless of hemoglobin value   At at all times try to transfuse 1 unit prbc as possible with exception of active hemorrhage  ENDOCRINE A:   No hx of diabetes known.  Pt has A1C of 7.0 and multiple CBG > 200.  Some underlying, unknown diabetes/prediabetes likely given A1c, but glucose also elevated 2/2 steroids/sickness.     P:   - sSSI - continue to monitor CBG.   MSK/DERM No skin  lesions noted.    Best practice:  Diet: NPO Pain/Anxiety/Delirium protocol (if indicated): PAD protocol VAP protocol (if indicated): in place DVT prophylaxis: subQ heparin GI prophylaxis: protonix Glucose control: sSSI Mobility: BD Code Status: full Family Communication: will update Disposition: ICU  Labs   CBC: Recent Labs  Lab 07/17/2019 1220 07/24/2019 2246 07/12/19 0324 07/12/19 0547  WBC 9.5  --  5.4  --   NEUTROABS 8.4*  --   --   --   HGB 15.3 12.6* 13.7 12.2*  HCT 48.2 37.0* 40.9 36.0*  MCV 72.9*  --  70.8*  --   PLT 352  --  269  --     Basic Metabolic Panel: Recent Labs  Lab 07/14/2019 1220 07/22/2019 1357 07/24/2019 2246 07/12/19 0117 07/12/19  6384 07/12/19 0547  NA 143  --  141 147* 147* 145  K 4.9  --  4.7 4.7 4.0 3.9  CL 102  --   --  112* 108  --   CO2 13*  --   --  16* 21*  --   GLUCOSE 211*  --   --  270* 205*  --   BUN 219*  --   --  172* 150*  --   CREATININE 14.63*  --   --  8.37* 6.77*  --   CALCIUM 9.1  --   --  7.3* 7.5*  --   MG  --  4.2*  --  3.0*  --   --   PHOS  --   --   --  8.3* 5.4*  --    GFR: Estimated Creatinine Clearance: 11 mL/min (A) (by C-G formula based on SCr of 6.77 mg/dL (H)). Recent Labs  Lab 06/29/2019 1220 06/28/2019 1357 07/14/2019 1600 07/12/19 0324  PROCALCITON 25.01  --   --   --   WBC 9.5  --   --  5.4  LATICACIDVEN  --  3.8* 3.7*  --     Liver Function Tests: Recent Labs  Lab 06/25/2019 1220 07/12/19 0324  AST 76*  --   ALT 65*  --   ALKPHOS 73  --   BILITOT 0.9  --   PROT 8.8*  --   ALBUMIN 3.2* 1.9*   No results for input(s): LIPASE, AMYLASE in the last 168 hours. Recent Labs  Lab 07/20/2019 1357  AMMONIA 30    ABG    Component Value Date/Time   PHART 7.457 (H) 07/12/2019 0547   PCO2ART 24.7 (L) 07/12/2019 0547   PO2ART 46.0 (L) 07/12/2019 0547   HCO3 17.9 (L) 07/12/2019 0547   TCO2 19 (L) 07/12/2019 0547   ACIDBASEDEF 5.0 (H) 07/12/2019 0547   O2SAT 90.0 07/12/2019 0547     Coagulation  Profile: Recent Labs  Lab 07/12/19 0117  INR 1.3*    Cardiac Enzymes: No results for input(s): CKTOTAL, CKMB, CKMBINDEX, TROPONINI in the last 168 hours.  HbA1C: Hgb A1c MFr Bld  Date/Time Value Ref Range Status  07/12/2019 05:00 AM 7.0 (H) 4.8 - 5.6 % Final    Comment:    (NOTE) Pre diabetes:          5.7%-6.4% Diabetes:              >6.4% Glycemic control for   <7.0% adults with diabetes     CBG: Recent Labs  Lab 07/05/2019 1458 07/03/2019 2240 07/12/19 0039 07/12/19 0429 07/12/19 0841  GLUCAP 176* 261* 260* 178* 146*    Review of Systems:   Unable to assess d/t patient condition  Past Medical History  He,  has a past medical history of Hypertension and MI (myocardial infarction) ([place]).   Surgical History    Past Surgical History:  Procedure Laterality Date   COLONOSCOPY N/A 06/22/2014   Procedure: COLONOSCOPY;  Surgeon: Rogene Houston, MD;  Location: [facility]O [facility];  Service: Endoscopy;  Laterality: N/A;  1230 - moved to 2:15 - Ann to notify     Social History   reports that he has never smoked. He has never used smokeless tobacco. He reports current alcohol use. He reports previous drug use.   Family History   His family history is not on file.   Allergies No Known Allergies   Home Medications  Prior to Admission medications  Medication Sig Start Date End Date Taking? Authorizing Provider  benzonatate (TESSALON) 100 MG capsule Take 100-200 mg by mouth 3 (three) times daily as needed for cough.   Yes [provider]  EDARBYCLOR 40-12.5 MG TABS Take 1 tablet by mouth daily. 06/01/19  Yes [provider]  pravastatin (PRAVACHOL) 10 MG tablet Take 10 mg by mouth daily. 05/03/19  Yes [provider]  sildenafil (VIAGRA) 50 MG tablet Take 50 mg by mouth daily as needed for erectile dysfunction.   Yes [provider]     Critical care time: 57

## 2019-07-13 ENCOUNTER — Inpatient Hospital Stay (HOSPITAL_COMMUNITY): Payer: BC Managed Care – PPO

## 2019-07-13 ENCOUNTER — Encounter (HOSPITAL_COMMUNITY): Payer: Self-pay | Admitting: Radiology

## 2019-07-13 DIAGNOSIS — U071 COVID-19: Secondary | ICD-10-CM

## 2019-07-13 DIAGNOSIS — I5021 Acute systolic (congestive) heart failure: Secondary | ICD-10-CM | POA: Diagnosis not present

## 2019-07-13 DIAGNOSIS — G253 Myoclonus: Secondary | ICD-10-CM

## 2019-07-13 DIAGNOSIS — R569 Unspecified convulsions: Secondary | ICD-10-CM

## 2019-07-13 DIAGNOSIS — J1289 Other viral pneumonia: Secondary | ICD-10-CM

## 2019-07-13 DIAGNOSIS — J9601 Acute respiratory failure with hypoxia: Secondary | ICD-10-CM | POA: Diagnosis not present

## 2019-07-13 DIAGNOSIS — N179 Acute kidney failure, unspecified: Secondary | ICD-10-CM | POA: Diagnosis not present

## 2019-07-13 DIAGNOSIS — R4182 Altered mental status, unspecified: Secondary | ICD-10-CM

## 2019-07-13 LAB — RENAL FUNCTION PANEL
Albumin: 2.1 g/dL — ABNORMAL LOW (ref 3.5–5.0)
Anion gap: 14 (ref 5–15)
BUN: 100 mg/dL — ABNORMAL HIGH (ref 8–23)
CO2: 23 mmol/L (ref 22–32)
Calcium: 7.6 mg/dL — ABNORMAL LOW (ref 8.9–10.3)
Chloride: 110 mmol/L (ref 98–111)
Creatinine, Ser: 3.76 mg/dL — ABNORMAL HIGH (ref 0.61–1.24)
GFR calc Af Amer: 19 mL/min — ABNORMAL LOW (ref >60)
GFR calc non Af Amer: 16 mL/min — ABNORMAL LOW (ref >60)
Glucose, Bld: 229 mg/dL — ABNORMAL HIGH (ref 70–99)
Phosphorus: 7.4 mg/dL — ABNORMAL HIGH (ref 2.5–4.6)
Potassium: 5.7 mmol/L — ABNORMAL HIGH (ref 3.5–5.1)
Sodium: 147 mmol/L — ABNORMAL HIGH (ref 135–145)

## 2019-07-13 LAB — PHOSPHORUS
Phosphorus: 4.3 mg/dL (ref 2.5–4.6)
Phosphorus: 3.4 mg/dL (ref 2.5–4.6)

## 2019-07-13 LAB — POCT I-STAT 7, (LYTES, BLD GAS, ICA,H+H)
Acid-base deficit: 3 mmol/L — ABNORMAL HIGH (ref 0.0–2.0)
Bicarbonate: 19.1 mmol/L — ABNORMAL LOW (ref 20.0–28.0)
Calcium, Ion: 1.15 mmol/L (ref 1.15–1.40)
HCT: 38 % — ABNORMAL LOW (ref 39.0–52.0)
Hemoglobin: 12.9 g/dL — ABNORMAL LOW (ref 13.0–17.0)
O2 Saturation: 86 %
Patient temperature: 39
Potassium: 5 mmol/L (ref 3.5–5.1)
Sodium: 151 mmol/L — ABNORMAL HIGH (ref 135–145)
TCO2: 20 mmol/L — ABNORMAL LOW (ref 22–32)
pCO2 arterial: 29.7 mmHg — ABNORMAL LOW (ref 32.0–48.0)
pH, Arterial: 7.426 (ref 7.350–7.450)
pO2, Arterial: 54 mmHg — ABNORMAL LOW (ref 83.0–108.0)

## 2019-07-13 LAB — GLUCOSE, CAPILLARY
Glucose-Capillary: 111 mg/dL — ABNORMAL HIGH (ref 70–99)
Glucose-Capillary: 137 mg/dL — ABNORMAL HIGH (ref 70–99)
Glucose-Capillary: 155 mg/dL — ABNORMAL HIGH (ref 70–99)
Glucose-Capillary: 188 mg/dL — ABNORMAL HIGH (ref 70–99)
Glucose-Capillary: 195 mg/dL — ABNORMAL HIGH (ref 70–99)
Glucose-Capillary: 212 mg/dL — ABNORMAL HIGH (ref 70–99)

## 2019-07-13 LAB — TRIGLYCERIDES: Triglycerides: 195 mg/dL — ABNORMAL HIGH (ref <150)

## 2019-07-13 LAB — MAGNESIUM
Magnesium: 3 mg/dL — ABNORMAL HIGH (ref 1.7–2.4)
Magnesium: 3 mg/dL — ABNORMAL HIGH (ref 1.7–2.4)
Magnesium: 3.1 mg/dL — ABNORMAL HIGH (ref 1.7–2.4)

## 2019-07-13 MED ORDER — DOCUSATE SODIUM 50 MG/5ML PO LIQD
100.0000 mg | Freq: Two times a day (BID) | ORAL | Status: DC
  Administered 2019-07-13 – 2019-07-15 (×3): 100 mg
  Filled 2019-07-13 (×4): qty 10

## 2019-07-13 MED ORDER — MIDAZOLAM HCL 2 MG/2ML IJ SOLN
2.0000 mg | Freq: Once | INTRAMUSCULAR | Status: AC
  Administered 2019-07-13: 2 mg via INTRAVENOUS
  Filled 2019-07-13: qty 2

## 2019-07-13 MED ORDER — ACETAMINOPHEN 160 MG/5ML PO SOLN
650.0000 mg | Freq: Four times a day (QID) | ORAL | Status: DC | PRN
  Administered 2019-07-13: 650 mg
  Filled 2019-07-13 (×2): qty 20.3

## 2019-07-13 MED ORDER — ACETAMINOPHEN 325 MG PO TABS
650.0000 mg | ORAL_TABLET | Freq: Four times a day (QID) | ORAL | Status: DC | PRN
  Filled 2019-07-13: qty 2

## 2019-07-13 MED ORDER — PRO-STAT SUGAR FREE PO LIQD
30.0000 mL | Freq: Two times a day (BID) | ORAL | Status: DC
  Administered 2019-07-13 – 2019-07-14 (×3): 30 mL
  Filled 2019-07-13 (×3): qty 30

## 2019-07-13 MED ORDER — FREE WATER
200.0000 mL | Freq: Three times a day (TID) | Status: DC
  Administered 2019-07-13 – 2019-07-14 (×4): 200 mL

## 2019-07-13 MED ORDER — SODIUM ZIRCONIUM CYCLOSILICATE 10 G PO PACK
10.0000 g | PACK | Freq: Two times a day (BID) | ORAL | Status: DC
  Administered 2019-07-13 – 2019-07-15 (×6): 10 g via ORAL
  Filled 2019-07-13 (×7): qty 1

## 2019-07-13 MED ORDER — VITAL HIGH PROTEIN PO LIQD
1000.0000 mL | ORAL | Status: DC
  Administered 2019-07-13: 1000 mL

## 2019-07-13 NOTE — Progress Notes (Signed)
RN noted patient to have jerking/twitching episodes upon final reassessment of shift. E-link MD notified. Waiting for orders

## 2019-07-13 NOTE — Progress Notes (Signed)
vLTM EEG running. Notified Neuro 

## 2019-07-13 NOTE — Progress Notes (Signed)
[facility] Progress Note Patient Name: Breyden Domer DOB: [birth year] MRN: [id number]   Date of Service  07/13/2019  HPI/Events of Note  Obtundation, jerking movements and decline in mental  status  eICU Interventions  Versed 2 mg iv, stat CT head w/o contrast, cEEG        Okoronkwo U Ogan 07/13/2019, 4:20 AM

## 2019-07-13 NOTE — Progress Notes (Addendum)
NAME:  Craig Mccoy, MRN:  [id number], DOB:  April 12, 1956, LOS: 2 ADMISSION DATE:  07/02/2019, CONSULTATION DATE:  11/17 REFERRING MD:  Rogene Houston, CHIEF COMPLAINT:  Acute respiratory failure   Brief History   Craig Mccoy is a 62 yo male, COVID positive, who was transferred from [facility] to [facility] for iHD.   History of present illness   Craig Mccoy has a PMH including but not limited to hypertension and prior MI per chart review (see "past medical history" for rest). Hepresented to [name] 11/17 with dyspnea and respiratory distress. He had apparently tested positive for COVID on 11/12 at an outside institution and had been in self isolation. His "lady friend" called EMS 11/17 due to above symptoms.  On EMS arrival, SpO2 was low despite supplemental O2. In ED, it was 77% before being placed on 15L NRB with improvement to 93%. Unfortunately, he deteriorated and required intubation. Repeat COVID testing returned as positive. He was also found to have multiple metabolic derangements including marked renal failure (SCr 14.63 with BUN 219), met acidosis + resp alkalosis (7.28, <19, 72), PCT 25.  Due to his potential to require iHD, he was transferred to [facility] for further evaluation and management.   Past Medical History  MI, HTN  Significant Hospital Events   11/17 - transferred to [facility] from [place]:  nephrology  Procedures:  ETT 11/17.  Echo - 11/18  Significant Diagnostic Tests:  CXR - 11/17 - bilateral opacities HS-trop - 1604>>1504 Ferritin 2490 CRP - 38 PCT - 25.01 D-Dimer 2.52, Fibrinogen > 800.  PT -16.4; INR 1.3. PTT 29.  Echo - 35% LVEF. Mild LVH. basl akinesis. G1DD CT head - no acute abnormality  Micro Data:  Blood Cx - 11/17 - NG2D  Respiratory Cx 11/17 - mod wbc, mod diplococci Urine Cx 11/17 - no growth MRSA pcr - negative COVID 11/17 - positive  Antimicrobials:  CTX - 11/17 -  Azithro - 11/17 -    Interim history/subjective:  Pt  intubated and sedated.   Objective   Blood pressure 108/84, pulse 68, temperature 99.7 F (37.6 C), resp. rate 15, height _0  (1.676 m), weight 81.2 kg, SpO2 91 %.    Vent Mode: PRVC FiO2 (%):  [40 %-80 %] 40 % Set Rate:  [14 bmp] 14 bmp Vt Set:  [510 mL] 510 mL PEEP:  [8 cmH20] 8 cmH20 Plateau Pressure:  [20 cmH20-32 cmH20] 20 cmH20   Intake/Output Summary (Last 24 hours) at 07/13/2019 0848 Last data filed at 07/13/2019 0800 Gross per 24 hour  Intake 3135.87 ml  Output 2673 ml  Net 462.87 ml   Filed Weights   07/22/2019 1214 07/17/2019 2244 07/13/19 0500  Weight: 81.6 kg 80 kg 81.2 kg    Examination: General: sedated, intubated.  Does not respond to voice.  HENT: intubated. Normal oral mucosa.  Lungs: inspiratory crackles heard, R>L.   Cardiovascular: RRR.  1+ radial pulse.  Abdomen: does not respond to palpation. Normal bowel sounds Extremities: no edema.   Neuro: sedated. unable to assess neuro status dt pt condition.   Resolved Hospital Problem list     Assessment & Plan:  ASSESSMENT / PLAN:  PULMONARY A:  Acute hypoxic respiratory failure secondary to COVID positive pneumonia. Pt currently on ventilator, sedated. CXR shows low lung volume and b/l airspace opacity consistent with covid PNA. Currently satting 91-94% on PRVC mode.   P:   - continue Abx: azithro (11/17 - ) and  CTX (11-17 - ) - f/u cultures - continue decadron 10m daily (11/17-) - continue remdesivir - (11/17-) - continue respiratory support as needed.    NEUROLOGIC A:   Currently sedated.  Had an episode of 'jerking' last night at shift change.  CT head was performed and is normal.  P:   - continue sedation with propofol while on vent - monitor for neurologic changes.  - f/u eeg  VASCULAR A:   Currently normotensive. On phenylephrine gtt currently. On edarbyclor 40-12.5 at home.   P:  - continue phenylephrine gtt. Wean as able.  - hold home meds - continue to monitor  CARDIAC  STRUCTURAL A: Previous hx of MI.  Pt was found to have elevated HS-Trop of 1604 (most recently 1504). Unable to assess for chest pain. No ST changes on EKG. Echo showed 35% EF, basal hypokinesis, G1DD. Triglyceride level improved, now normal.    P: - consider cardiology consult,  - monitor Triglyceride daily, consider decreasing/discontinuing propofol if severely elevated.   CARDIAC ELECTRICAL A: No ST changes seen on EKG.   P: - on continuous cardiac monitoring.   INFECTIOUS A:   COVID positive with symptoms/labs/imaging consistent with COVID pneumonia.  Currently intubated d/t acute respiratory distress and on pressors for BP support, h. aspirate/blood/pending. Receiving abx and steroid/remdesivir.   P:   - continue abx - continue steroid/remdesivir - monitor daily labs: cbc, renal panel, mg, triglyceride  RENAL A:  AKI - No hx of renal disease known. Pt presented with Cr level of 136.64 metabolic acidosis w bicarb of 13 with an AG of >20, BUN of 219. Nephrology consulted and the patient was started on CRRT with improvement in kidney function/electrolytes. UOP 14462myesterday.  Nephrology stopped CRRT and will continue to monitor daily for dialysis need.   P:  - f/u nephro recs.   ELECTROLYTES A:  Pt receiving lokelma for elevated K.   P: - continue to monitor with daily renal function panel   GASTROINTESTINAL A:   No GI concerns currently   P:   - continue pantoprazole daily for GI prophylaxis.   HEMATOLOGIC A:  Hgb decreased from admission but still > 12.    P:  - continue to monitor w/ daily cbc.  - PRBC for hgb </= 6.9gm%    - exceptions are   -  if ACS susepcted/confirmed then transfuse for hgb </= 8.0gm%,  or    -  active bleeding with hemodynamic instability, then transfuse regardless of hemoglobin value   At at all times try to transfuse 1 unit prbc as possible with exception of active hemorrhage  ENDOCRINE A:   No hx of diabetes known.  Pt has  A1C of 7.0 and multiple CBG > 200.  Some underlying, unknown diabetes/prediabetes likely given A1c, but glucose also elevated 2/2 steroids/sickness.     P:   - sSSI - continue to monitor CBG.   MSK/DERM No skin lesions noted.    Best practice:  Diet: NPO Pain/Anxiety/Delirium protocol (if indicated): PAD protocol VAP protocol (if indicated): in place DVT prophylaxis: subQ heparin GI prophylaxis: protonix Glucose control: sSSI Mobility: Bedrest Code Status: full Family Communication: will update Disposition: ICU  Labs   CBC: Recent Labs  Lab 07/24/2019 1220 07/18/2019 2246 07/12/19 0324 07/12/19 0547  WBC 9.5  --  5.4  --   NEUTROABS 8.4*  --   --   --   HGB 15.3 12.6* 13.7 12.2*  HCT 48.2 37.0* 40.9 36.0*  MCV  72.9*  --  70.8*  --   PLT 352  --  269  --     Basic Metabolic Panel: Recent Labs  Lab 07/03/2019 1220 07/15/2019 1357  07/12/19 0117 07/12/19 0324 07/12/19 0547 07/12/19 1600 07/13/19 0432 07/13/19 0433  NA 143  --    < > 147* 147* 145 145 147*  --   K 4.9  --    < > 4.7 4.0 3.9 5.0 5.7*  --   CL 102  --   --  112* 108  --  109 110  --   CO2 13*  --   --  16* 21*  --  21* 23  --   GLUCOSE 211*  --   --  270* 205*  --  152* 229*  --   BUN 219*  --   --  172* 150*  --  92* 100*  --   CREATININE 14.63*  --   --  8.37* 6.77*  --  4.29* 3.76*  --   CALCIUM 9.1  --   --  7.3* 7.5*  --  7.8* 7.6*  --   MG  --  4.2*  --  3.0*  --   --   --   --  3.0*  PHOS  --   --   --  8.3* 5.4*  --  6.5* 7.4*  --    < > = values in this interval not displayed.   GFR: Estimated Creatinine Clearance: 19.9 mL/min (A) (by C-G formula based on SCr of 3.76 mg/dL (H)). Recent Labs  Lab 07/18/2019 1220 07/22/2019 1357 07/10/2019 1600 07/12/19 0324  PROCALCITON 25.01  --   --   --   WBC 9.5  --   --  5.4  LATICACIDVEN  --  3.8* 3.7*  --     Liver Function Tests: Recent Labs  Lab 06/30/2019 1220 07/12/19 0324 07/12/19 1600 07/13/19 0432  AST 76*  --   --   --   ALT 65*  --    --   --   ALKPHOS 73  --   --   --   BILITOT 0.9  --   --   --   PROT 8.8*  --   --   --   ALBUMIN 3.2* 1.9* 2.4* 2.1*   No results for input(s): LIPASE, AMYLASE in the last 168 hours. Recent Labs  Lab 06/25/2019 1357  AMMONIA 30    ABG    Component Value Date/Time   PHART 7.457 (H) 07/12/2019 0547   PCO2ART 24.7 (L) 07/12/2019 0547   PO2ART 46.0 (L) 07/12/2019 0547   HCO3 17.9 (L) 07/12/2019 0547   TCO2 19 (L) 07/12/2019 0547   ACIDBASEDEF 5.0 (H) 07/12/2019 0547   O2SAT 90.0 07/12/2019 0547     Coagulation Profile: Recent Labs  Lab 07/12/19 0117  INR 1.3*    Cardiac Enzymes: No results for input(s): CKTOTAL, CKMB, CKMBINDEX, TROPONINI in the last 168 hours.  HbA1C: Hgb A1c MFr Bld  Date/Time Value Ref Range Status  07/12/2019 05:00 AM 7.0 (H) 4.8 - 5.6 % Final    Comment:    (NOTE) Pre diabetes:          5.7%-6.4% Diabetes:              >6.4% Glycemic control for   <7.0% adults with diabetes     CBG: Recent Labs  Lab 07/12/19 1526 07/12/19 2021 07/13/19 0036 07/13/19 0428 07/13/19 0809  GLUCAP 127*  198* 195* 212* 188*    Review of Systems:   Unable to assess d/t patient condition   Critical care time: 20

## 2019-07-13 NOTE — Consult Note (Addendum)
Neurology Consultation  Reason for Consult: Seizure  Referring Physician: Dr. Loanne Drilling  History is obtained from: Chart   HPI: Craig Mccoy is a 63 y.o. male with history of MI, HLD, prior MI, hypertension and Covid positive. Patient tested positive for Covid-19 on 11/12 at an outside institution. He had been self isolating at home with SP02 of 80% on 3L 02. He presented to [facility], ED on 11/17 with dyspnea and respiratory distress. Also had altered mentation. At [facility] echocardiogram revealed reduced EF. PCXR showed elevation of the left hemidiaphragm and diffuse bilateral interstitial and heterogeneous airspace opacity. He has been diagnosed with acute hypoxemic respiratory failure. At [facility], the patient was intubated, placed on vent and sedated. Hospital course was complicated by AKI. He was transferred to [facility] for possible hemodialysis/CRRT.    On 07/12/2019 RN noted patient to have jerking/twitching episode on overnight shift, concerning for seizure.  He was given 2 mg Versed with no further episodes. Currently patient is intubated and unable to give any history. He is currently on fentanyl and Precedex. For his acute respiratory failure secondary to Covid-19 PNA, he is on full vent support, ceftriaxone, azithromycin, Dexamethasone and Remdesivir as well as anticoagulation per Covid protocol.    Past Medical History:  Diagnosis Date  . Hypertension   . MI (myocardial infarction) ([place])     Family History  Problem Relation Age of Onset  . Hypertension Mother   . Hypertension Father    Social History:   reports that he has never smoked. He has never used smokeless tobacco. He reports current alcohol use. He reports previous drug use.  Medications  Current Facility-Administered Medications:  .  azithromycin (ZITHROMAX) 500 mg in sodium chloride 0.9 % 250 mL IVPB, 500 mg, Intravenous, Q24H, Desai, Rahul P, PA-C, Stopped at 07/13/19 0145 .  cefTRIAXone (ROCEPHIN) 2 g in sodium chloride 0.9  % 100 mL IVPB, 2 g, Intravenous, QHS, Franky Macho, RPH, Stopped at 07/12/19 2336 .  chlorhexidine gluconate (MEDLINE KIT) (PERIDEX) 0.12 % solution 15 mL, 15 mL, Mouth Rinse, BID, Collier Bullock, MD, 15 mL at 07/13/19 0819 .  Chlorhexidine Gluconate Cloth 2 % PADS 6 each, 6 each, Topical, Daily, Collier Bullock, MD, 6 each at 07/12/19 2246 .  dexamethasone (DECADRON) injection 6 mg, 6 mg, Intravenous, Daily, Desai, Rahul P, PA-C, 6 mg at 07/12/19 1020 .  dexmedetomidine (PRECEDEX) 400 MCG/100ML (4 mcg/mL) infusion, 0-1.2 mcg/kg/hr, Intravenous, Continuous, Masters, Jake Church, RPH, Last Rate: 16 mL/hr at 07/13/19 0800, 0.8 mcg/kg/hr at 07/13/19 0800 .  fentaNYL (SUBLIMAZE) injection 50 mcg, 50 mcg, Intravenous, Q15 min PRN, Desai, Rahul P, PA-C .  fentaNYL (SUBLIMAZE) injection 50-200 mcg, 50-200 mcg, Intravenous, Q30 min PRN, Desai, Rahul P, PA-C .  fentaNYL 2574mg in NS 2559m(1028mml) infusion-PREMIX, 0-400 mcg/hr, Intravenous, Continuous, Masters, AliJake ChurchPH, Last Rate: 15 mL/hr at 07/13/19 1000, 150 mcg/hr at 07/13/19 1000 .  free water 200 mL, 200 mL, Per Tube, Q8H, UptMadelon LipsD .  heparin injection 7,500 Units, 7,500 Units, Subcutaneous, Q8H, Desai, Rahul P, PA-C, 7,500 Units at 07/13/19 0554 .  insulin aspart (novoLOG) injection 0-9 Units, 0-9 Units, Subcutaneous, Q4H, Desai, Rahul P, PA-C, 2 Units at 07/13/19 0816 .  MEDLINE mouth rinse, 15 mL, Mouth Rinse, 10 times per day, GonCollier BullockD, 15 mL at 07/13/19 0553 .  pantoprazole (PROTONIX) injection 40 mg, 40 mg, Intravenous, QHS, Desai, Rahul P, PA-C, 40 mg at 07/12/19 2235 .  phenylephrine (NEOSYNEPHRINE) 40-0.9 MG/250ML-% infusion, 0-400  mcg/min, Intravenous, Titrated, Masters, Jake Church, [facility], Last Rate: 18.75 mL/hr at 07/13/19 0911, 50 mcg/min at 07/13/19 0911 .  [COMPLETED] remdesivir 200 mg in sodium chloride 0.9 % 250 mL IVPB, 200 mg, Intravenous, Once, Stopped at 07/12/19 0144 **FOLLOWED BY** remdesivir 100 mg in  sodium chloride 0.9 % 250 mL IVPB, 100 mg, Intravenous, Q24H, [place]y Macho, RPH, Stopped at 07/13/19 0029 .  sodium zirconium cyclosilicate (LOKELMA) packet 10 g, 10 g, Oral, BID, Madelon Lips, MD   Exam: Current vital signs: BP 96/76   Pulse 68   Temp 99.5 F (37.5 C)   Resp (!) 27   Ht 5' 6" (1.676 m)   Wt 81.2 kg   SpO2 91%   BMI 28.89 kg/m  Vital signs in last 24 hours: Temp:  [95.7 F (35.4 C)-99.7 F (37.6 C)] 99.5 F (37.5 C) (11/19 1000) Pulse Rate:  [57-78] 68 (11/19 0800) Resp:  [12-27] 27 (11/19 1000) BP: (65-131)/(52-93) 96/76 (11/19 1000) SpO2:  [89 %-99 %] 91 % (11/19 0800) FiO2 (%):  [40 %-80 %] 40 % (11/19 0800) Weight:  [81.2 kg] 81.2 kg (11/19 0500)  ROS:  Unable to obtain due to intubation    Physical Exam   Constitutional: Appears well-developed and well-nourished.  Psych: Intubated Eyes: No scleral injection HENT: No OP obstrucion Head: Normocephalic.  Cardiovascular: Normal rate and regular rhythm.  Respiratory: Intubated and breathing over the ventilator GI: Soft.  No distension. There is no tenderness.  Skin: WDI  Neuro: (follow up attending exam is performed while patient is sedated with fentanyl and precedex) Mental Status: Patient is intubated, breathing over the ventilator.  Follows no commands.  Slightly winces to noxious stimuli. Cranial Nerves: II: Blinks to threat III,IV, VI: Pupils are 1 mm and reactive.  Corneal reflexes intact.  Disconjugate gaze.  Doll's intact. Intermittent upbeating nystagmus suggestive of ocular myoclonus is seen. Also with concurrent eyelid twitching.  V: Does not respond to brow ridge pressure. VII: No grimace to noxious. Subtle intermittent twitching of lips noted.  VIII: Does not respond to vocal stimulus XI: Head is at midline.  Motor: Flaccid throughout however with any form of stimulus he shows stimulus induced polymyoclonus on initial exam by PA. On follow up attending exam the patient does not  exhibit myoclonus to pinch in any extremity.  Sensory: Winces to noxious stimuli in all extremities on PA exam. On follow up exam, no reaction to noxious stimuli.  Deep Tendon Reflexes: 2+ in bilateral upper extremities Plantars: Mute bilaterally on both initial and follow up exams.  Cerebellar/Gait: Unable to assess.   Labs I have reviewed labs in epic and the results pertinent to this consultation are:   CBC    Component Value Date/Time   WBC 5.4 07/12/2019 0324   RBC 5.78 07/12/2019 0324   HGB 12.2 (L) 07/12/2019 0547   HCT 36.0 (L) 07/12/2019 0547   PLT 269 07/12/2019 0324   MCV 70.8 (L) 07/12/2019 0324   MCH 23.7 (L) 07/12/2019 0324   MCHC 33.5 07/12/2019 0324   RDW 15.9 (H) 07/12/2019 0324   LYMPHSABS 0.6 (L) 07/06/2019 1220   MONOABS 0.4 07/05/2019 1220   EOSABS 0.0 06/27/2019 1220   BASOSABS 0.0 07/10/2019 1220    CMP     Component Value Date/Time   NA 147 (H) 07/13/2019 0432   K 5.7 (H) 07/13/2019 0432   CL 110 07/13/2019 0432   CO2 23 07/13/2019 0432   GLUCOSE 229 (H) 07/13/2019 5364  BUN 100 (H) 07/13/2019 0432   CREATININE 3.76 (H) 07/13/2019 0432   CALCIUM 7.6 (L) 07/13/2019 0432   PROT 8.8 (H) 07/10/2019 1220   ALBUMIN 2.1 (L) 07/13/2019 0432   AST 76 (H) 06/29/2019 1220   ALT 65 (H) 07/22/2019 1220   ALKPHOS 73 07/24/2019 1220   BILITOT 0.9 07/17/2019 1220   GFRNONAA 16 (L) 07/13/2019 0432   GFRAA 19 (L) 07/13/2019 0432    Lipid Panel     Component Value Date/Time   TRIG 195 (H) 07/13/2019 0432     Imaging I have reviewed the images obtained:  CT-scan of the brain-no acute intracranial abnormality, mild chronic small vessel ischemia  EEG-LTM: Currently being placed  Etta Quill PA-C [facility] [phone] 07/13/2019, 10:37 AM     Assessment: 62 year old male, with Covid PNA, intubated and being managed in the ICU for hypoxic respiratory failure. Hospital course complicated by AKI. While in ICU nurse noted  jerking/twitching episodes concerning for seizures.  During exam patient shows stimulus induced polymyoclonus.  1. Myoclonus is an ominous sign suggestive of possible diffuse cortical injury or inflammation/infection as may be seen with Covid encephalopathy. Also on DDx is hypoxic brain injury and multifocal acute strokes.  2. AMS: Excess sedation in the context of impaired renal function is a possible contributing factor, in addition to the components of the DDx in #1, above.  3. CT head: No acute intracranial abnormality. Mild in extent chronic microvascular ischemic findings are noted.  4. Bedside review of LTM reveals no electrographic seizures or myoclonic discharges. Diffuse slowing is noted. The patient is sedated on fentanyl and Precedex during assessment of the EEG tracings.   Recommendations: -If LTM reveals seizure activity overnight, will initiate antiepileptic medication. However for now we will hold off unless electrographic seizures are seen.  -MRI brain when stable.  --Frequent neuro checks --We will continue to follow with you   I have seen and examined the patient. I have formulated the assessment and recommendations. 63 year old male with Covid PNA, critically ill on the vent, with AKI, now with new onset of stimulus-induced myoclonus. Also with an episode overnight of spontaneous myoclonus, which has not recurred. Plan includes MRI brain, LTM EEG and frequent neuro checks.  Electronically signed: Dr. Kerney Elbe

## 2019-07-13 NOTE — Progress Notes (Signed)
[facility] Progress Note Patient Name: Craig Mccoy DOB: January 28, 1956 MRN: [id number]   Date of Service  07/13/2019  HPI/Events of Note  Nursing states he has agonal respirations, however, he is intubated and ventilate, Fever to about 102.0 F. Will try to control fever better and increase sedation. Will also check ABG. SAT = 88 - 91%  eICU Interventions  Will order: 1. Cooling blanket. 2. Please give Fentanyl IV and titrate IV infusion as ordered. 3. ABG STAT.     Intervention Category Major Interventions: Other:  Lysle Dingwall 07/13/2019, 8:58 PM

## 2019-07-13 NOTE — Progress Notes (Deleted)
eLink Physician-Brief Progress Note Patient Name: Craig Mccoy DOB: September 01, 1955 MRN: [id number]   Date of Service  07/13/2019  HPI/Events of Note  Abrupt change in mental status, it may have followed a dose of flexeril by a few hours  eICU Interventions  Likely Flexeril induced but because of apparent abrupt presentation Pt will need stat brain CT to exclude ICH, Flexeril discontinued again.        Kerry Kass Keyanni Whittinghill 07/13/2019, 4:37 AM

## 2019-07-13 NOTE — Progress Notes (Signed)
Attempted to call pt's mother to update her but she is at the [facility] with [place].  The other contact number belonged to [place], who is roommates with the patient's mother. Updated Ms. Scales on the patient's condition.  She would appreciate further updates.

## 2019-07-13 NOTE — Progress Notes (Addendum)
[facility] Progress Note    Assessment/ Plan:   1. AKI - secondary to sepsis + COVID with metabolic acidosis with a pH of 7.18. Initially required a short run of CRRT 11/17-11/18, now making urine and off as of PM of 11/18.  2. Hyperkalemia: lokelma BID and follow  3. Hypernatremia: mild, will add FWF, expect to improve   4. COVID PNA being tx with Decadron and Remdesivir--> intubated, on azithro/ CTX as well, per PCCM  5. Shock: still on a little phenylephrine   6.  Obtundation: CT head negative, EEG pending  Subjective:    Making urine, CRRT taken down yesterday.  Cr falling, BUN up a little as is K.  Had MS change last night- CT head negative, EEG pending.     Objective:   BP 108/84 (BP Location: Right Arm)   Pulse 68   Temp 99.7 F (37.6 C)   Resp 15   Ht '5\' 6"'$  (1.676 m)   Wt 81.2 kg   SpO2 91%   BMI 28.89 kg/m   Intake/Output Summary (Last 24 hours) at 07/13/2019 5277 Last data filed at 07/13/2019 0700 Gross per 24 hour  Intake 3076.77 ml  Output 2473 ml  Net 603.77 ml   Weight change: -0.447 kg  Physical Exam:  Not examined d/t preservation of PPE, relying on primary exam and RN assessment  Imaging: Ct Head Wo Contrast  Result Date: 07/13/2019 CLINICAL DATA:  Acute change in mental status. EXAM: CT HEAD WITHOUT CONTRAST TECHNIQUE: Contiguous axial images were obtained from the base of the skull through the vertex without intravenous contrast. COMPARISON:  None. FINDINGS: Brain: No intracranial hemorrhage, mass effect, or midline shift. Brain volume is normal for age. Mild chronic small vessel ischemia in the periventricular white matter. No hydrocephalus. The basilar cisterns are patent. No evidence of territorial infarct or acute ischemia. No extra-axial or intracranial fluid collection. Vascular: Atherosclerosis of skullbase vasculature without hyperdense vessel or abnormal calcification. Skull: No fracture or focal lesion. Sinuses/Orbits:  Mastoid air cells hypo pneumatized bilaterally. Minimal maxillary sinus mucosal thickening. No sinus fluid level. Orbits are unremarkable. Other: None. IMPRESSION: 1. No acute intracranial abnormality. 2. Mild chronic small vessel ischemia. Electronically Signed   By: Keith Rake M.D.   On: 07/13/2019 05:08   Dg Chest Port 1 View  Result Date: 07/12/2019 CLINICAL DATA:  Respiratory failure.  Intubated patient. EXAM: PORTABLE CHEST 1 VIEW COMPARISON:  07/12/2019 FINDINGS: Endotracheal tube is 5.6 cm above the carina. Right jugular central line with the tip in the upper SVC region. Nasogastric tube tip terminates near the GE junction. Nasogastric tube side hole continues to be in the distal esophagus region. Again noted is elevation of left hemidiaphragm. Bilateral airspace densities, right side greater than left. Airspace disease has minimally changed. Heart size is within normal limits. Negative for a pneumothorax. IMPRESSION: 1. Minimal change in the bilateral airspace disease. Findings are compatible with pneumonia. 2. Nasogastric tube tip is near the GE junction and recommend advancing the tube if possible. 3. Endotracheal tube is stable in position. Electronically Signed   By: Markus Daft M.D.   On: 07/12/2019 08:39   Dg Chest Port 1 View  Result Date: 07/12/2019 CLINICAL DATA:  63 year old male central line placement. EXAM: PORTABLE CHEST 1 VIEW COMPARISON:  07/20/2019. FINDINGS: Portable AP semi upright view at 0029 hours. Stable endotracheal tube tip at the level the clavicles. Enteric tube courses to the abdomen in the midline, side hole is at the  level of the distal thoracic esophagus. Right IJ approach central line placed, tip at the level of the SVC. No pneumothorax. Stable cardiac size and mediastinal contours. Stable low lung volumes with elevated left hemidiaphragm. Coarse bilateral basilar predominant pulmonary interstitial opacity. Negative visible bowel gas pattern. IMPRESSION: 1.  Right IJ central line placed, tip at the level of the SVC. No pneumothorax. 2. Enteric tube side hole remains in the distal thoracic esophagus. Advance 6 cm to ensure side hole placement within the stomach. 3. Otherwise stable lines and tubes. Stable ventilation. Electronically Signed   By: Genevie Ann M.D.   On: 07/12/2019 00:49   Dg Chest Port 1 View  Result Date: 07/12/2019 CLINICAL DATA:  Post intubation EXAM: PORTABLE CHEST 1 VIEW COMPARISON:  Radiograph 07/10/2019 FINDINGS: Endotracheal tube terminates in the mid trachea, approximately 5 cm from the carina. A transesophageal tube tip is distal to the GE junction however the side port remains in the distal thoracic esophagus and should be advanced for optimal function. Redemonstrated elevation of the left hemidiaphragm with diffuse bilateral interstitial and airspace opacities. Cardiomediastinal contours are grossly unchanged from prior accounting for differences in rotation and technique. IMPRESSION: 1. Endotracheal tube terminates in the mid trachea, approximately 5 cm from the carina. 2. A transesophageal tube tip is distal to the GE junction however the side port remains in the distal thoracic esophagus and tube should be advanced 3-4 cm for optimal function. 3. Unchanged diffuse bilateral interstitial and airspace opacities. Electronically Signed   By: Lovena Le M.D.   On: 06/25/2019 18:44   Dg Chest Portable 1 View  Result Date: 06/26/2019 CLINICAL DATA:  Shortness of breath, COVID-19 EXAM: PORTABLE CHEST 1 VIEW COMPARISON:  None. FINDINGS: Low volume AP portable examination with elevation of the left hemidiaphragm and diffuse bilateral interstitial and heterogeneous airspace opacity. The heart and mediastinum are unremarkable. IMPRESSION: Low volume AP portable examination with elevation of the left hemidiaphragm and diffuse bilateral interstitial and heterogeneous airspace opacity. Findings are consistent with reported diagnosis of COVID-19.  Electronically Signed   By: Eddie Candle M.D.   On: 07/20/2019 13:15   Dg Abd Portable 1v  Result Date: 07/12/2019 CLINICAL DATA:  Check gastric catheter placement EXAM: PORTABLE ABDOMEN - 1 VIEW COMPARISON:  None. FINDINGS: Gastric catheter is noted coiled in the stomach. Scattered large and small bowel gas is noted. No free air is seen. No bony abnormality is noted. IMPRESSION: Gastric catheter within the stomach. Electronically Signed   By: Inez Catalina M.D.   On: 07/12/2019 14:08    Labs: BMET Recent Labs  Lab 07/01/2019 1220 06/30/2019 2246 07/12/19 0117 07/12/19 0324 07/12/19 0547 07/12/19 1600 07/13/19 0432  NA 143 141 147* 147* 145 145 147*  K 4.9 4.7 4.7 4.0 3.9 5.0 5.7*  CL 102  --  112* 108  --  109 110  CO2 13*  --  16* 21*  --  21* 23  GLUCOSE 211*  --  270* 205*  --  152* 229*  BUN 219*  --  172* 150*  --  92* 100*  CREATININE 14.63*  --  8.37* 6.77*  --  4.29* 3.76*  CALCIUM 9.1  --  7.3* 7.5*  --  7.8* 7.6*  PHOS  --   --  8.3* 5.4*  --  6.5* 7.4*   CBC Recent Labs  Lab 07/21/2019 1220 07/10/2019 2246 07/12/19 0324 07/12/19 0547  WBC 9.5  --  5.4  --   NEUTROABS 8.4*  --   --   --  HGB 15.3 12.6* 13.7 12.2*  HCT 48.2 37.0* 40.9 36.0*  MCV 72.9*  --  70.8*  --   PLT 352  --  269  --     Medications:    . chlorhexidine gluconate (MEDLINE KIT)  15 mL Mouth Rinse BID  . Chlorhexidine Gluconate Cloth  6 each Topical Daily  . dexamethasone (DECADRON) injection  6 mg Intravenous Daily  . heparin  7,500 Units Subcutaneous Q8H  . insulin aspart  0-9 Units Subcutaneous Q4H  . mouth rinse  15 mL Mouth Rinse 10 times per day  . pantoprazole (PROTONIX) IV  40 mg Intravenous QHS  . sodium zirconium cyclosilicate  10 g Oral BID      Madelon Lips, MD 07/13/2019, 8:42 AM

## 2019-07-13 NOTE — Consult Note (Addendum)
Cardiology Consultation:   Patient ID: Craig Mccoy MRN: [id number]; DOB: 28-Jun-1956  Admit date: 07/21/2019 Date of Consult: 07/13/2019  Primary Care Provider: Celene Squibb, MD Primary Cardiologist: No primary care provider on file. new should be [place] office   Primary Electrophysiologist:  None    Patient Profile:   Craig Mccoy is a 63 y.o. male with a hx of HTN, and prior MI per hx and HLD on statinwho is being seen today for the evaluation of decreased EF after admit 07/16/2019 with COVID +, dyspnea and respiratory distress at the request of Dr. Loanne Drilling.  History of Present Illness:   Mr. Boehringer with HTN and MI though no record in our system was brought to ER at [facility] with known + Covid test and self isolating at home and SP02 of 80% on 3L 02,  Once on non rebreather sp02 to 93%.  Pt with altered mentation as well.    In ER ABGs ph 7.281, pCo2 <19 p)2 72, bicarb 12 Na 143, K+ 4.9 BUN 219 cr 14.63 AST 76 ALT 65  LDH 429 Hgb 15.3 pts 352  ddimer 2.52  Fibrinogen >800   PCXR elevation of the left hemidiaphragm and diffuse bilateral interstitial and heterogeneous airspace opacity. Findings are consistent with reported diagnosis of COVID-19.  EKG:  The EKG was personally reviewed and demonstrates:  07/22/2019 with artifact and SR with PACs no acute ST changes Telemetry:  Telemetry was personally reviewed and demonstrates:  SR Troponin hs 1664 pk  Pt was intubated and placed on vent. Sedated.  Pt transferred to [facility] for possible hemodialysis.-CRRT.   IV fluids given  Nephrology has seen and dialysis started.  On neo for shock.    Yesterday  echo with EF 35%, mildly increased LVH, nasal to mid inferior akinesis  Basal inferolateral and anterolateral  Akinesis.  G1 DD.  Normal RV size and function.  Rt and Lt atrium normal size.   Mental status change neurology has seen.   Today Na 147 K+ 5.7 BUN 100 Cr 3.76 Mg+ 3.0  CT head IMPRESSION: 1. No acute intracranial  abnormality. 2. Mild chronic small vessel ischemia.  BP 108/78 P 68 temp 101.2  On fentanyl drip phenylephrine and dexmedetomidine   Heart Pathway Score:     Past Medical History:  Diagnosis Date  . Hypertension   . MI (myocardial infarction) [facility])     Past Surgical History:  Procedure Laterality Date  . COLONOSCOPY N/A 06/22/2014   Procedure: COLONOSCOPY;  Surgeon: Rogene Houston, MD;  Location: [facility];  Service: Endoscopy;  Laterality: N/A;  1230 - moved to 2:15 - Ann to notify     Home Medications:  Prior to Admission medications   Medication Sig Start Date End Date Taking? Authorizing Provider  benzonatate (TESSALON) 100 MG capsule Take 100-200 mg by mouth 3 (three) times daily as needed for cough.   Yes [provider]  EDARBYCLOR 40-12.5 MG TABS Take 1 tablet by mouth daily. 06/01/19  Yes [provider]  pravastatin (PRAVACHOL) 10 MG tablet Take 10 mg by mouth daily. 05/03/19  Yes [provider]  sildenafil (VIAGRA) 50 MG tablet Take 50 mg by mouth daily as needed for erectile dysfunction.   Yes [provider]    Inpatient Medications: Scheduled Meds: . chlorhexidine gluconate (MEDLINE KIT)  15 mL Mouth Rinse BID  . Chlorhexidine Gluconate Cloth  6 each Topical Daily  . dexamethasone (DECADRON) injection  6 mg Intravenous Daily  .  docusate  100 mg Per Tube BID  . feeding supplement (PRO-STAT SUGAR FREE 64)  30 mL Per Tube BID  . feeding supplement (VITAL HIGH PROTEIN)  1,000 mL Per Tube Q24H  . free water  200 mL Per Tube Q8H  . heparin  7,500 Units Subcutaneous Q8H  . insulin aspart  0-9 Units Subcutaneous Q4H  . mouth rinse  15 mL Mouth Rinse 10 times per day  . pantoprazole (PROTONIX) IV  40 mg Intravenous QHS  . sodium zirconium cyclosilicate  10 g Oral BID   Continuous Infusions: . azithromycin Stopped (07/13/19 0145)  . cefTRIAXone (ROCEPHIN)  IV Stopped (07/12/19 2336)  . dexmedetomidine (PRECEDEX) IV infusion 0.8  mcg/kg/hr (07/13/19 1414)  . fentaNYL infusion INTRAVENOUS 150 mcg/hr (07/13/19 1200)  . phenylephrine (NEO-SYNEPHRINE) Adult infusion 50 mcg/min (07/13/19 1400)  . remdesivir 100 mg in NS 250 mL Stopped (07/13/19 0029)   PRN Meds: acetaminophen, fentaNYL (SUBLIMAZE) injection, fentaNYL (SUBLIMAZE) injection  Allergies:   No Known Allergies  Social History:   Social History   Socioeconomic History  . Marital status: Single    Spouse name: Not on file  . Number of children: Not on file  . Years of education: Not on file  . Highest education level: Not on file  Occupational History  . Not on file  Social Needs  . Financial resource strain: Not on file  . Food insecurity    Worry: Not on file    Inability: Not on file  . Transportation needs    Medical: Not on file    Non-medical: Not on file  Tobacco Use  . Smoking status: Never Smoker  . Smokeless tobacco: Never Used  Substance and Sexual Activity  . Alcohol use: Yes    Comment: 4-5 beers  a day  . 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 Sexual activity: Not on file  Lifestyle  . Physical activity    Days per week: Not on file    Minutes per session: Not on file  . Stress: Not on file  Relationships  . Social Herbalist on phone: Not on file    Gets together: Not on file    Attends religious service: Not on file    Active member of club or organization: Not on file    Attends meetings of clubs or organizations: Not on file    Relationship status: Not on file  . Intimate partner violence    Fear of current or ex partner: Not on file    Emotionally abused: Not on file    Physically abused: Not on file    Forced sexual activity: Not on file  Other Topics Concern  . Not on file  Social History Narrative  . Not on file    Family History:    Family History  Problem Relation Age of Onset  . Hypertension Mother   . Hypertension Father      ROS:  Please see the  history of present illness. Information is from chart as pt sedated on vent.  General:+ covid prior to admit  Skin:no rashes or ulcers HEENT:no blurred vision, no congestion CV:see HPI PUL:see HPI GI:no diarrhea constipation or melena, no indigestion GU:no hematuria, no dysuria MS:no joint pain, no claudication Neuro:no syncope, no lightheadedness Endo:no diabetes, no thyroid disease  All other ROS reviewed and negative.     Physical Exam/Data:   Vitals:   07/13/19  1230 07/13/19 1300 07/13/19 1330 07/13/19 1400  BP: 102/80 112/82 (!) 89/69 108/78  Pulse: 70 68 66 66  Resp: _0 Temp: 100.2 F (37.9 C) (!) 100.4 F (38 C) (!) 100.6 F (38.1 C) (!) 100.8 F (38.2 C)  TempSrc:      SpO2: (!) 82% 94% (!) 86% 98%  Weight:      Height:        Intake/Output Summary (Last 24 hours) at 07/13/2019 1438 Last data filed at 07/13/2019 1400 Gross per 24 hour  Intake 2392.34 ml  Output 1890 ml  Net 502.34 ml   Last 3 Weights 07/13/2019 07/13/2019 07/02/2019  Weight (lbs) 179 lb 0.2 oz 176 lb 5.9 oz 180 lb  Weight (kg) 81.2 kg 80 kg 81.647 kg     Body mass index is 28.89 kg/m.  EXAM Per Dr. Marlou Porch    Relevant CV Studies: Echo 07/12/19  IMPRESSIONS    1. Left ventricular ejection fraction, by visual estimation, is 35%. The left ventricle has moderately decreased function. There is mildly increased left ventricular hypertrophy. Basal to mid inferior akinesis. Basal inferolateral and anterolateral  akinesis.  2. Left ventricular diastolic parameters are consistent with Grade I diastolic dysfunction (impaired relaxation).  3. Global right ventricle has normal systolic function.The right ventricular size is normal. No increase in right ventricular wall thickness.  4. Left atrial size was normal.  5. Right atrial size was normal.  6. The mitral valve is normal in structure. Trace mitral valve regurgitation. No evidence of mitral stenosis.  7. The tricuspid valve is  normal in structure. Tricuspid valve regurgitation is not demonstrated.  8. The aortic valve is tricuspid. Aortic valve regurgitation is not visualized. Mild aortic valve sclerosis without stenosis.  9. TR signal is inadequate for assessing pulmonary artery systolic pressure. 10. The inferior vena cava is normal in size with <50% respiratory variability, suggesting right atrial pressure of 8 mmHg.  FINDINGS  Left Ventricle: Left ventricular ejection fraction, by visual estimation, is 35%. The left ventricle has moderately decreased function. The left ventricular internal cavity size was the left ventricle is normal in size. There is mildly increased left  ventricular hypertrophy. Left ventricular diastolic parameters are consistent with Grade I diastolic dysfunction (impaired relaxation).  Right Ventricle: The right ventricular size is normal. No increase in right ventricular wall thickness. Global RV systolic function is has normal systolic function.  Left Atrium: Left atrial size was normal in size.  Right Atrium: Right atrial size was normal in size  Pericardium: There is no evidence of pericardial effusion.  Mitral Valve: The mitral valve is normal in structure. There is mild calcification of the mitral valve leaflet(s). No evidence of mitral valve stenosis by observation. Trace mitral valve regurgitation.  Tricuspid Valve: The tricuspid valve is normal in structure. Tricuspid valve regurgitation is not demonstrated.  Aortic Valve: The aortic valve is tricuspid. Aortic valve regurgitation is not visualized. Mild aortic valve sclerosis is present, with no evidence of aortic valve stenosis.  Pulmonic Valve: The pulmonic valve was normal in structure. Pulmonic valve regurgitation is not visualized.  Aorta: The aortic root is normal in size and structure.  Venous: The inferior vena cava is normal in size with less than 50% respiratory variability, suggesting right atrial  pressure of 8 mmHg.  IAS/Shunts: No atrial level shunt detected by color flow Doppler.    Laboratory Data:  High Sensitivity Troponin:   Recent Labs  Lab 07/12/19 0117 07/12/19 0324 07/12/19  [place]* 1,664* 1,604*     Chemistry Recent Labs  Lab 07/12/19 0324 07/12/19 0547 07/12/19 1600 07/13/19 0432  NA 147* 145 145 147*  K 4.0 3.9 5.0 5.7*  CL 108  --  109 110  CO2 21*  --  21* 23  GLUCOSE 205*  --  152* 229*  BUN 150*  --  92* 100*  CREATININE 6.77*  --  4.29* 3.76*  CALCIUM 7.5*  --  7.8* 7.6*  GFRNONAA 8*  --  14* 16*  GFRAA 9*  --  16* 19*  ANIONGAP 18*  --  15 14    Recent Labs  Lab 07/14/2019 1220 07/12/19 0324 07/12/19 1600 07/13/19 0432  PROT 8.8*  --   --   --   ALBUMIN 3.2* 1.9* 2.4* 2.1*  AST 76*  --   --   --   ALT 65*  --   --   --   ALKPHOS 73  --   --   --   BILITOT 0.9  --   --   --    Hematology Recent Labs  Lab 07/04/2019 1220 07/20/2019 2246 07/12/19 0324 07/12/19 0547  WBC 9.5  --  5.4  --   RBC 6.61*  --  5.78  --   HGB 15.3 12.6* 13.7 12.2*  HCT 48.2 37.0* 40.9 36.0*  MCV 72.9*  --  70.8*  --   MCH 23.1*  --  23.7*  --   MCHC 31.7  --  33.5  --   RDW 17.7*  --  15.9*  --   PLT 352  --  269  --    BNPNo results for input(s): BNP, PROBNP in the last 168 hours.  DDimer  Recent Labs  Lab 06/28/2019 1220  DDIMER 2.52*     Radiology/Studies:  Ct Head Wo Contrast  Result Date: 07/13/2019 CLINICAL DATA:  Acute change in mental status. EXAM: CT HEAD WITHOUT CONTRAST TECHNIQUE: Contiguous axial images were obtained from the base of the skull through the vertex without intravenous contrast. COMPARISON:  None. FINDINGS: Brain: No intracranial hemorrhage, mass effect, or midline shift. Brain volume is normal for age. Mild chronic small vessel ischemia in the periventricular white matter. No hydrocephalus. The basilar cisterns are patent. No evidence of territorial infarct or acute ischemia. No extra-axial or  intracranial fluid collection. Vascular: Atherosclerosis of skullbase vasculature without hyperdense vessel or abnormal calcification. Skull: No fracture or focal lesion. Sinuses/Orbits: Mastoid air cells hypo pneumatized bilaterally. Minimal maxillary sinus mucosal thickening. No sinus fluid level. Orbits are unremarkable. Other: None. IMPRESSION: 1. No acute intracranial abnormality. 2. Mild chronic small vessel ischemia. Electronically Signed   By: Keith Rake M.D.   On: 07/13/2019 05:08   Dg Chest Port 1 View  Result Date: 07/12/2019 CLINICAL DATA:  Respiratory failure.  Intubated patient. EXAM: PORTABLE CHEST 1 VIEW COMPARISON:  07/12/2019 FINDINGS: Endotracheal tube is 5.6 cm above the carina. Right jugular central line with the tip in the upper SVC region. Nasogastric tube tip terminates near the GE junction. Nasogastric tube side hole continues to be in the distal esophagus region. Again noted is elevation of left hemidiaphragm. Bilateral airspace densities, right side greater than left. Airspace disease has minimally changed. Heart size is within normal limits. Negative for a pneumothorax. IMPRESSION: 1. Minimal change in the bilateral airspace disease. Findings are compatible with pneumonia. 2. Nasogastric tube tip is near the GE junction and recommend advancing the tube if possible. 3. Endotracheal  tube is stable in position. Electronically Signed   By: Markus Daft M.D.   On: 07/12/2019 08:39   Dg Chest Port 1 View  Result Date: 07/12/2019 CLINICAL DATA:  63 year old male central line placement. EXAM: PORTABLE CHEST 1 VIEW COMPARISON:  07/22/2019. FINDINGS: Portable AP semi upright view at 0029 hours. Stable endotracheal tube tip at the level the clavicles. Enteric tube courses to the abdomen in the midline, side hole is at the level of the distal thoracic esophagus. Right IJ approach central line placed, tip at the level of the SVC. No pneumothorax. Stable cardiac size and mediastinal  contours. Stable low lung volumes with elevated left hemidiaphragm. Coarse bilateral basilar predominant pulmonary interstitial opacity. Negative visible bowel gas pattern. IMPRESSION: 1. Right IJ central line placed, tip at the level of the SVC. No pneumothorax. 2. Enteric tube side hole remains in the distal thoracic esophagus. Advance 6 cm to ensure side hole placement within the stomach. 3. Otherwise stable lines and tubes. Stable ventilation. Electronically Signed   By: Genevie Ann M.D.   On: 07/12/2019 00:49   Dg Chest Port 1 View  Result Date: 07/09/2019 CLINICAL DATA:  Post intubation EXAM: PORTABLE CHEST 1 VIEW COMPARISON:  Radiograph 07/01/2019 FINDINGS: Endotracheal tube terminates in the mid trachea, approximately 5 cm from the carina. A transesophageal tube tip is distal to the GE junction however the side port remains in the distal thoracic esophagus and should be advanced for optimal function. Redemonstrated elevation of the left hemidiaphragm with diffuse bilateral interstitial and airspace opacities. Cardiomediastinal contours are grossly unchanged from prior accounting for differences in rotation and technique. IMPRESSION: 1. Endotracheal tube terminates in the mid trachea, approximately 5 cm from the carina. 2. A transesophageal tube tip is distal to the GE junction however the side port remains in the distal thoracic esophagus and tube should be advanced 3-4 cm for optimal function. 3. Unchanged diffuse bilateral interstitial and airspace opacities. Electronically Signed   By: Lovena Le M.D.   On: 07/18/2019 18:44   Dg Chest Portable 1 View  Result Date: 07/24/2019 CLINICAL DATA:  Shortness of breath, COVID-19 EXAM: PORTABLE CHEST 1 VIEW COMPARISON:  None. FINDINGS: Low volume AP portable examination with elevation of the left hemidiaphragm and diffuse bilateral interstitial and heterogeneous airspace opacity. The heart and mediastinum are unremarkable. IMPRESSION: Low volume AP portable  examination with elevation of the left hemidiaphragm and diffuse bilateral interstitial and heterogeneous airspace opacity. Findings are consistent with reported diagnosis of COVID-19. Electronically Signed   By: Eddie Candle M.D.   On: 07/07/2019 13:15   Dg Abd Portable 1v  Result Date: 07/12/2019 CLINICAL DATA:  Check gastric catheter placement EXAM: PORTABLE ABDOMEN - 1 VIEW COMPARISON:  None. FINDINGS: Gastric catheter is noted coiled in the stomach. Scattered large and small bowel gas is noted. No free air is seen. No bony abnormality is noted. IMPRESSION: Gastric catheter within the stomach. Electronically Signed   By: Inez Catalina M.D.   On: 07/12/2019 14:08    Assessment and Plan:   1. Cardiomyopathy no old echo or EKG to compare pk troponin hs 1664   I&O +6303  Wt 81.2 Kg up from 80 Kg. Unable to use BB or ACE/ARB now with shock and AKI on renal failure.  Volume seems controlled with dialysis.  Will follow along for now.   2. Elevated troponin most likely due to acute illness and shock no ischemic work up for now due to acute illness 3. Sepsis due  to + COVID and on phenlephrine PNA 4. Acute respiratory failure on vent per CCM 5. AKI - CRRT per nephrology  6. Acute encephalopathy possible seizures neuro following 7. Hx borderline diabetes  8. HTN hx       For questions or updates, please contact [place] Please consult [email or web address] for contact info under     Signed, Cecilie Kicks, NP  07/13/2019 2:38 PM  Personally seen and examined. Agree with above.  63 year old male with prior myocardial infarction, unknown prior ejection fraction here with Covid positive respiratory failure, septic shock, acute renal failure on run of CRRT, acute systolic heart failure, EF 35%.  Exam: Currently is on ventilator, sedate, unable to answer questions Appears calm.  Mild dyssynchrony noted on ventilator.  Foley catheter in place, urine noted in bag.  Unable to assess neurologic  status.  He did have what appeared to be some seizure-like activity earlier, EEG in place.  Echo-EF 35%  Assessment and plan:  Acute systolic heart failure -EF 35% with regional wall motion abnormalities as described above, prior possible old MI. -Agree with current management which includes phenylephrine for BP support. -Chest x-ray does appear to demonstrate bilateral infiltrates/possible edema. -Now that he is starting to make urine, I would like to see what Dr. Hollie Salk thinks about potential for IV furosemide timing. -At this time, avoid beta-blocker and ACE inhibitor/ARB-contraindications. -In the future, post Covid, we can repeat his echocardiogram and when hemodynamics allow, slowly add beta-blocker and if renal function allows, ARB/ACE or possible hydralazine/isosorbide if afterload reduction is necessary. -Troponin is moderately elevated likely result of his heart failure and may be in part Covid related myocarditis.  Continue to monitor clinically and treat underlying disease. -Continue with supportive care, no new changes  AKI/acute renal failure -Felt secondary to sepsis/Covid, brief run of CRRT on the 17th and 18th.  Foley currently in place, noteworthy of urine  Covid positive -Pneumonia-Decadron, remdesivir, intubated, azithromycin ceftriaxone  CRITICAL CARE Performed by: Candee Furbish   Total critical care time: 40 minutes  Critical care time was exclusive of separately billable procedures and treating other patients.  Critical care was necessary to treat or prevent imminent or life-threatening deterioration.  Critical care was time spent personally by me on the following activities: development of treatment plan with patient and/or surrogate as well as nursing, discussions with consultants, evaluation of patient's response to treatment, examination of patient, obtaining history from patient or surrogate, ordering and performing treatments and interventions, ordering and  review of laboratory studies, ordering and review of radiographic studies, pulse oximetry and re-evaluation of patient's condition.  Candee Furbish, MD

## 2019-07-14 ENCOUNTER — Inpatient Hospital Stay (HOSPITAL_COMMUNITY): Payer: BC Managed Care – PPO

## 2019-07-14 DIAGNOSIS — G9340 Encephalopathy, unspecified: Secondary | ICD-10-CM | POA: Diagnosis not present

## 2019-07-14 DIAGNOSIS — J9601 Acute respiratory failure with hypoxia: Secondary | ICD-10-CM | POA: Diagnosis not present

## 2019-07-14 DIAGNOSIS — U071 COVID-19: Secondary | ICD-10-CM

## 2019-07-14 DIAGNOSIS — M7989 Other specified soft tissue disorders: Secondary | ICD-10-CM | POA: Diagnosis not present

## 2019-07-14 DIAGNOSIS — N179 Acute kidney failure, unspecified: Secondary | ICD-10-CM | POA: Diagnosis not present

## 2019-07-14 LAB — CBC WITH DIFFERENTIAL/PLATELET
Abs Immature Granulocytes: 0.1 10*3/uL — ABNORMAL HIGH (ref 0.00–0.07)
Basophils Absolute: 0 10*3/uL (ref 0.0–0.1)
Basophils Relative: 0 %
Eosinophils Absolute: 0 10*3/uL (ref 0.0–0.5)
Eosinophils Relative: 0 %
HCT: 39 % (ref 39.0–52.0)
Hemoglobin: 12.5 g/dL — ABNORMAL LOW (ref 13.0–17.0)
Immature Granulocytes: 1 %
Lymphocytes Relative: 3 %
Lymphs Abs: 0.2 10*3/uL — ABNORMAL LOW (ref 0.7–4.0)
MCH: 23.3 pg — ABNORMAL LOW (ref 26.0–34.0)
MCHC: 32.1 g/dL (ref 30.0–36.0)
MCV: 72.8 fL — ABNORMAL LOW (ref 80.0–100.0)
Monocytes Absolute: 0.4 10*3/uL (ref 0.1–1.0)
Monocytes Relative: 5 %
Neutro Abs: 7.2 10*3/uL (ref 1.7–7.7)
Neutrophils Relative %: 91 %
Platelets: 319 10*3/uL (ref 150–400)
RBC: 5.36 MIL/uL (ref 4.22–5.81)
RDW: 16.1 % — ABNORMAL HIGH (ref 11.5–15.5)
WBC: 7.9 10*3/uL (ref 4.0–10.5)
nRBC: 0 % (ref 0.0–0.2)

## 2019-07-14 LAB — FERRITIN: Ferritin: 2125 ng/mL — ABNORMAL HIGH (ref 24–336)

## 2019-07-14 LAB — LACTATE DEHYDROGENASE: LDH: 456 U/L — ABNORMAL HIGH (ref 98–192)

## 2019-07-14 LAB — CULTURE, RESPIRATORY W GRAM STAIN

## 2019-07-14 LAB — GLUCOSE, CAPILLARY
Glucose-Capillary: 161 mg/dL — ABNORMAL HIGH (ref 70–99)
Glucose-Capillary: 196 mg/dL — ABNORMAL HIGH (ref 70–99)
Glucose-Capillary: 235 mg/dL — ABNORMAL HIGH (ref 70–99)
Glucose-Capillary: 208 mg/dL — ABNORMAL HIGH (ref 70–99)
Glucose-Capillary: 329 mg/dL — ABNORMAL HIGH (ref 70–99)
Glucose-Capillary: 308 mg/dL — ABNORMAL HIGH (ref 70–99)

## 2019-07-14 LAB — RENAL FUNCTION PANEL
Albumin: 1.8 g/dL — ABNORMAL LOW (ref 3.5–5.0)
Anion gap: 11 (ref 5–15)
BUN: 104 mg/dL — ABNORMAL HIGH (ref 8–23)
CO2: 22 mmol/L (ref 22–32)
Calcium: 7.7 mg/dL — ABNORMAL LOW (ref 8.9–10.3)
Chloride: 119 mmol/L — ABNORMAL HIGH (ref 98–111)
Creatinine, Ser: 2.47 mg/dL — ABNORMAL HIGH (ref 0.61–1.24)
GFR calc Af Amer: 31 mL/min — ABNORMAL LOW (ref >60)
GFR calc non Af Amer: 27 mL/min — ABNORMAL LOW (ref >60)
Glucose, Bld: 235 mg/dL — ABNORMAL HIGH (ref 70–99)
Phosphorus: 4.3 mg/dL (ref 2.5–4.6)
Potassium: 5 mmol/L (ref 3.5–5.1)
Sodium: 152 mmol/L — ABNORMAL HIGH (ref 135–145)

## 2019-07-14 LAB — MAGNESIUM
Magnesium: 3.1 mg/dL — ABNORMAL HIGH (ref 1.7–2.4)
Magnesium: 2.9 mg/dL — ABNORMAL HIGH (ref 1.7–2.4)

## 2019-07-14 LAB — D-DIMER, QUANTITATIVE: D-Dimer, Quant: 1.94 ug/mL-FEU — ABNORMAL HIGH (ref 0.00–0.50)

## 2019-07-14 LAB — PHOSPHORUS: Phosphorus: 5.5 mg/dL — ABNORMAL HIGH (ref 2.5–4.6)

## 2019-07-14 LAB — C-REACTIVE PROTEIN: CRP: 22.6 mg/dL — ABNORMAL HIGH (ref <1.0)

## 2019-07-14 MED ORDER — MIDAZOLAM HCL 2 MG/2ML IJ SOLN
INTRAMUSCULAR | Status: AC
  Filled 2019-07-14: qty 4

## 2019-07-14 MED ORDER — DEXTROSE 5 % IV SOLN
INTRAVENOUS | Status: AC
  Administered 2019-07-14: 10:00:00 via INTRAVENOUS

## 2019-07-14 MED ORDER — FREE WATER
300.0000 mL | Freq: Four times a day (QID) | Status: DC
  Administered 2019-07-14 – 2019-07-15 (×4): 300 mL

## 2019-07-14 MED ORDER — MIDAZOLAM HCL 2 MG/2ML IJ SOLN
4.0000 mg | Freq: Once | INTRAMUSCULAR | Status: AC
  Administered 2019-07-14: 4 mg via INTRAVENOUS

## 2019-07-14 MED ORDER — VITAL HIGH PROTEIN PO LIQD: 1000.0000 mL | ORAL | Status: DC

## 2019-07-14 MED ORDER — LACTATED RINGERS IV BOLUS
500.0000 mL | Freq: Once | INTRAVENOUS | Status: AC
  Administered 2019-07-14: 500 mL via INTRAVENOUS

## 2019-07-14 MED ORDER — MIDAZOLAM 50MG/50ML (1MG/ML) PREMIX INFUSION
0.0000 mg/h | INTRAVENOUS | Status: DC
  Administered 2019-07-14 (×2): 2 mg/h via INTRAVENOUS
  Filled 2019-07-14 (×2): qty 50

## 2019-07-14 MED ORDER — VITAL AF 1.2 CAL PO LIQD
1000.0000 mL | ORAL | Status: DC
  Administered 2019-07-14 – 2019-07-16 (×3): 1000 mL

## 2019-07-14 NOTE — Progress Notes (Signed)
Tube at 25 at the lip  MD will get a xray tomm

## 2019-07-14 NOTE — Progress Notes (Signed)
[facility] Progress Note    Assessment/ Plan:   1. AKI - secondary to sepsis + COVID with metabolic acidosis with a pH of 7.18. Initially required a short run of CRRT 11/17-11/18, now making urine and off as of PM of 11/18.  No further indication for RRT.  Nothing further to add- will sign off.  Please call with questions.  2. Hyperkalemia: lokelma BID and follow- improving  3. Hypernatremia: a little worse today, I have increase FWF to 300 q 6 and added D5W at 75 mL/ hr, expect to improve, if doesn't then would change to low- osm TF  4. COVID PNA being tx with Decadron and Remdesivir--> intubated, on azithro/ CTX as well, per PCCM  5. Shock: improving  6.  Obtundation: CT head negative, EEG pending  Subjective:    Having post-ATN diuresis, Cr down, BUN down, Na up a little at 151-152.     Objective:   BP 97/70   Pulse 61   Temp (!) 97.5 F (36.4 C)   Resp 14   Ht 5' 6" (1.676 m)   Wt 81.5 kg   SpO2 99%   BMI 29.00 kg/m   Intake/Output Summary (Last 24 hours) at 07/14/2019 0907 Last data filed at 07/14/2019 0900 Gross per 24 hour  Intake 2366.92 ml  Output 2420 ml  Net -53.08 ml   Weight change: 0.3 kg  Physical Exam:  Not examined d/t preservation of PPE, relying on primary exam and RN assessment  Imaging: Ct Head Wo Contrast  Result Date: 07/13/2019 CLINICAL DATA:  Acute change in mental status. EXAM: CT HEAD WITHOUT CONTRAST TECHNIQUE: Contiguous axial images were obtained from the base of the skull through the vertex without intravenous contrast. COMPARISON:  None. FINDINGS: Brain: No intracranial hemorrhage, mass effect, or midline shift. Brain volume is normal for age. Mild chronic small vessel ischemia in the periventricular white matter. No hydrocephalus. The basilar cisterns are patent. No evidence of territorial infarct or acute ischemia. No extra-axial or intracranial fluid collection. Vascular: Atherosclerosis of skullbase vasculature  without hyperdense vessel or abnormal calcification. Skull: No fracture or focal lesion. Sinuses/Orbits: Mastoid air cells hypo pneumatized bilaterally. Minimal maxillary sinus mucosal thickening. No sinus fluid level. Orbits are unremarkable. Other: None. IMPRESSION: 1. No acute intracranial abnormality. 2. Mild chronic small vessel ischemia. Electronically Signed   By: Keith Rake M.D.   On: 07/13/2019 05:08   Dg Chest Port 1 View  Result Date: 07/14/2019 CLINICAL DATA:  Respiratory failure. EXAM: PORTABLE CHEST 1 VIEW COMPARISON:  Radiograph 07/12/2019. FINDINGS: Endotracheal tube borderline high in positioning 8.8 cm from the carina at the thoracic inlet. Enteric tube tip and side-port below the diaphragm in the stomach. Right internal jugular venous catheter/sheath tip projects over the upper SVC. No pneumothorax. Elevated left hemidiaphragm. Heterogeneous bilateral airspace disease not significantly changed in the interim. No significant pleural fluid. Heart is normal in size with unchanged mediastinal contours. IMPRESSION: 1. Endotracheal tube borderline high in positioning, 8.8 cm from the carina at the thoracic inlet. 2. Unchanged bilateral airspace disease. Electronically Signed   By: Keith Rake M.D.   On: 07/14/2019 05:34   Dg Abd Portable 1v  Result Date: 07/12/2019 CLINICAL DATA:  Check gastric catheter placement EXAM: PORTABLE ABDOMEN - 1 VIEW COMPARISON:  None. FINDINGS: Gastric catheter is noted coiled in the stomach. Scattered large and small bowel gas is noted. No free air is seen. No bony abnormality is noted. IMPRESSION: Gastric catheter within the stomach. Electronically  Signed   By: Inez Catalina M.D.   On: 07/12/2019 14:08    Labs: BMET Recent Labs  Lab 07/19/2019 1220  07/12/19 0117 07/12/19 0324 07/12/19 0547 07/12/19 1600 07/13/19 0432 07/13/19 1447 07/13/19 2025 07/13/19 2127 07/14/19 0405  NA 143   < > 147* 147* 145 145 147*  --   --  151* 152*  K 4.9    < > 4.7 4.0 3.9 5.0 5.7*  --   --  5.0 5.0  CL 102  --  112* 108  --  109 110  --   --   --  119*  CO2 13*  --  16* 21*  --  21* 23  --   --   --  22  GLUCOSE 211*  --  270* 205*  --  152* 229*  --   --   --  235*  BUN 219*  --  172* 150*  --  92* 100*  --   --   --  104*  CREATININE 14.63*  --  8.37* 6.77*  --  4.29* 3.76*  --   --   --  2.47*  CALCIUM 9.1  --  7.3* 7.5*  --  7.8* 7.6*  --   --   --  7.7*  PHOS  --   --  8.3* 5.4*  --  6.5* 7.4* 4.3 3.4  --  4.3   < > = values in this interval not displayed.   CBC Recent Labs  Lab 07/05/2019 1220  07/12/19 0324 07/12/19 0547 07/13/19 2127 07/14/19 0404  WBC 9.5  --  5.4  --   --  7.9  NEUTROABS 8.4*  --   --   --   --  7.2  HGB 15.3   < > 13.7 12.2* 12.9* 12.5*  HCT 48.2   < > 40.9 36.0* 38.0* 39.0  MCV 72.9*  --  70.8*  --   --  72.8*  PLT 352  --  269  --   --  319   < > = values in this interval not displayed.    Medications:    . chlorhexidine gluconate (MEDLINE KIT)  15 mL Mouth Rinse BID  . Chlorhexidine Gluconate Cloth  6 each Topical Daily  . dexamethasone (DECADRON) injection  6 mg Intravenous Daily  . docusate  100 mg Per Tube BID  . feeding supplement (PRO-STAT SUGAR FREE 64)  30 mL Per Tube BID  . feeding supplement (VITAL HIGH PROTEIN)  1,000 mL Per Tube Q24H  . free water  300 mL Per Tube Q6H  . heparin  7,500 Units Subcutaneous Q8H  . insulin aspart  0-9 Units Subcutaneous Q4H  . mouth rinse  15 mL Mouth Rinse 10 times per day  . pantoprazole (PROTONIX) IV  40 mg Intravenous QHS  . sodium zirconium cyclosilicate  10 g Oral BID      Madelon Lips, MD 07/14/2019, 9:07 AM

## 2019-07-14 NOTE — Progress Notes (Signed)
NAME:  Craig Mccoy, MRN:  [id number], DOB:  02-10-1956, LOS: 3 ADMISSION DATE:  07/20/2019, CONSULTATION DATE:  11/17 REFERRING MD:  Rogene Houston, CHIEF COMPLAINT:  Acute respiratory failure   Brief History   Craig Mccoy is a 63 yo male, COVID positive, who was transferred from [facility] to [facility] for iHD.   History of present illness   Craig Mccoy a 63 y.o.malewho has a PMH including but not limited to hypertension and prior MI per chart review (see "past medical history" for rest). Hepresented to [name] 11/17 with dyspnea and respiratory distress. He had apparently tested positive for COVID on 11/12 at an outside institution and had been in self isolation. His "lady friend" called EMS 11/17 due to above symptoms.  On EMS arrival, SpO2 was low despite supplemental O2. In ED, it was 77% before being placed on 15L NRB with improvement to 93%. Unfortunately, he deteriorated and required intubation. Repeat COVID testing returned as positive. He was also found to have multiple metabolic derangements including marked renal failure (SCr 14.63 with BUN 219), met acidosis + resp alkalosis (7.28, <19, 72), PCT 25.  Due to his potential to require iHD, he was transferred to [facility] for further evaluation and management.   Past Medical History  MI, HTN  Significant Hospital Events   11/17 - transferred to [facility] from [place]:  nephrology  Procedures:  ETT 11/17.  Echo - 11/18  Significant Diagnostic Tests:  CXR - 11/17 - bilateral opacities HS-trop - 1604>>1504 Ferritin 2490 CRP - 38 PCT - 25.01 D-Dimer 2.52, Fibrinogen > 800.  PT -16.4; INR 1.3. PTT 29.  Echo - 35% LVEF. Mild LVH. basl akinesis. G1DD CT head - no acute abnormality  Micro Data:  Blood Cx - 11/17 - NG2D  Respiratory Cx 11/17 - mod wbc, mod diplococci Urine Cx 11/17 - no growth MRSA pcr - negative COVID 11/17 - positive  Antimicrobials:  CTX - 11/17 -  Azithro - 11/17 -    Interim history/subjective:  Pt  intubated and sedated.   Objective   Blood pressure 114/78, pulse 68, temperature (!) 97.3 F (36.3 C), resp. rate 18, height '5\' 6"'$  (1.676 m), weight 81.5 kg, SpO2 91 %.    Vent Mode: PRVC FiO2 (%):  [40 %-60 %] 50 % Set Rate:  [14 bmp] 14 bmp Vt Set:  [510 mL] 510 mL PEEP:  [8 cmH20] 8 cmH20 Plateau Pressure:  [19 cmH20-23 cmH20] 23 cmH20   Intake/Output Summary (Last 24 hours) at 07/14/2019 0822 Last data filed at 07/14/2019 5364 Gross per 24 hour  Intake 2324.44 ml  Output 2070 ml  Net 254.44 ml   Filed Weights   07/19/2019 2244 07/13/19 0500 07/14/19 0347  Weight: 80 kg 81.2 kg 81.5 kg    Examination: General: sedated, intubated.  Does not respond to voice or touch.  HENT: intubated. Normal oral mucosa.  Lungs: LCTAB. Ventilator sounds auscultated.   Cardiovascular: RRR.  1+ radial pulse.  Abdomen: does not respond to palpation. Normal bowel sounds Extremities: no edema.  Arms and legs cold to touch b/l. Pt was on hypothermic blanket earlier this am.  Neuro: sedated. unable to assess neuro status dt pt condition.   Resolved Hospital Problem list     Assessment & Plan:  ASSESSMENT / PLAN: Acute encephalopathy 2/2 ?seizures - one episode of seizure like activity on 11/18.  - EEG - neurology following - benzo if pt is has repeat seizure activity.   Sedation on  mechanical ventilation Continue Fentanyl.  Pt switched from precedex for versed.   Acute respiratory failure/insufficiency 2/2 COVID-19 pneumonia Full vent support Extubation is precluded due to mental status Trend inflammatory markers Continue Ceftriaxone and azithro Continue Dexamethasone x 10 d Continue Remdesivir x 5d Anticoagulation per COVID protocol Follow up repeat blood cultures.   Fever - pt had Tmax 102.2 on 11/19.  Pt had blood cx drawn, hypothermic blanket  Was placed, now removed. Pt currently hypothermic at 97.3.  - follow up Blood Cx   Acute systolic heart failure - EF 35%, unclear  if new. Cardiology consulted,  Elevated troponin - peak 1664. 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   Appreciate cardiology input.   Hypotension - currently normotensive continue phenylephrine for BP support.   AKI Hyperkalemia Volume removal per CRRT. Appreicate Nephrology input Lokelma per Nephro  Currently normotensive. On phenylephrine gtt currently. On edarbyclor 40-12.5 at home.    Best practice:  Diet: NPO Pain/Anxiety/Delirium protocol (if indicated): PAD protocol VAP protocol (if indicated): in place DVT prophylaxis: subQ heparin GI prophylaxis: protonix Glucose control: sSSI Mobility: Bedrest Code Status: full Family Communication: will update family.  Disposition: ICU  Labs   CBC: Recent Labs  Lab 07/02/2019 1220 07/24/2019 2246 07/12/19 0324 07/12/19 0547 07/13/19 2127 07/14/19 0404  WBC 9.5  --  5.4  --   --  7.9  NEUTROABS 8.4*  --   --   --   --  7.2  HGB 15.3 12.6* 13.7 12.2* 12.9* 12.5*  HCT 48.2 37.0* 40.9 36.0* 38.0* 39.0  MCV 72.9*  --  70.8*  --   --  72.8*  PLT 352  --  269  --   --  527    Basic Metabolic Panel: Recent Labs  Lab 07/12/19 0117 07/12/19 0324 07/12/19 0547 07/12/19 1600 07/13/19 0432 07/13/19 0433 07/13/19 1447 07/13/19 2025 07/13/19 2127 07/14/19 0404 07/14/19 0405  NA 147* 147* 145 145 147*  --   --   --  151*  --  152*  K 4.7 4.0 3.9 5.0 5.7*  --   --   --  5.0  --  5.0  CL 112* 108  --  109 110  --   --   --   --   --  119*  CO2 16* 21*  --  21* 23  --   --   --   --   --  22  GLUCOSE 270* 205*  --  152* 229*  --   --   --   --   --  235*  BUN 172* 150*  --  92* 100*  --   --   --   --   --  104*  CREATININE 8.37* 6.77*  --  4.29* 3.76*  --   --   --   --   --  2.47*  CALCIUM 7.3* 7.5*  --  7.8* 7.6*  --   --   --   --   --  7.7*  MG 3.0*  --   --   --   --  3.0* 3.0* 3.1*  --  3.1*  --   PHOS 8.3* 5.4*  --  6.5* 7.4*  --  4.3 3.4  --   --  4.3   GFR: Estimated Creatinine Clearance: 30.3 mL/min (A) (by C-G  formula based on SCr of 2.47 mg/dL (H)). Recent Labs  Lab 07/13/2019 1220 07/01/2019 1357 07/04/2019 1600 07/12/19 0324 07/14/19 0404  PROCALCITON  25.01  --   --   --   --   WBC 9.5  --   --  5.4 7.9  LATICACIDVEN  --  3.8* 3.7*  --   --     Liver Function Tests: Recent Labs  Lab 07/13/2019 1220 07/12/19 0324 07/12/19 1600 07/13/19 0432 07/14/19 0405  AST 76*  --   --   --   --   ALT 65*  --   --   --   --   ALKPHOS 73  --   --   --   --   BILITOT 0.9  --   --   --   --   PROT 8.8*  --   --   --   --   ALBUMIN 3.2* 1.9* 2.4* 2.1* 1.8*   No results for input(s): LIPASE, AMYLASE in the last 168 hours. Recent Labs  Lab 07/05/2019 1357  AMMONIA 30    ABG    Component Value Date/Time   PHART 7.426 07/13/2019 2127   PCO2ART 29.7 (L) 07/13/2019 2127   PO2ART 54.0 (L) 07/13/2019 2127   HCO3 19.1 (L) 07/13/2019 2127   TCO2 20 (L) 07/13/2019 2127   ACIDBASEDEF 3.0 (H) 07/13/2019 2127   O2SAT 86.0 07/13/2019 2127     Coagulation Profile: Recent Labs  Lab 07/12/19 0117  INR 1.3*    Cardiac Enzymes: No results for input(s): CKTOTAL, CKMB, CKMBINDEX, TROPONINI in the last 168 hours.  HbA1C: Hgb A1c MFr Bld  Date/Time Value Ref Range Status  07/12/2019 05:00 AM 7.0 (H) 4.8 - 5.6 % Final    Comment:    (NOTE) Pre diabetes:          5.7%-6.4% Diabetes:              >6.4% Glycemic control for   <7.0% adults with diabetes     CBG: Recent Labs  Lab 07/13/19 1115 07/13/19 1730 07/13/19 2014 07/14/19 0103 07/14/19 0333  GLUCAP 155* 111* 137* 161* 196*    Review of Systems:   Unable to assess d/t patient condition   Critical care time: 20

## 2019-07-14 NOTE — Procedures (Addendum)
Patient Name: Craig Mccoy  MRN: [id number]  Epilepsy Attending: Lora Havens  Referring Physician/Provider: Dr Trevor Mace Duration: 07/13/2019 1151 to 11/20/20201151  Patient history: 63 year old male with COVID-19 and altered mental status, seizure-like episodes.  EEG to evaluate for seizures.  Level of alertness: Sedated/comatose  AEDs during EEG study: Versed  Technical aspects: This EEG study was done with scalp electrodes positioned according to the 10-20 International system of electrode placement. Electrical activity was acquired at a sampling rate of 500Hz  and reviewed with a high frequency filter of 70Hz  and a low frequency filter of 1Hz . EEG data were recorded continuously and digitally stored.   Description: EEG showed continuous generalized 2 to 5 Hz theta-delta slowing.  EEG was reactive to tactile stimuli.  Hyperventilation and photic stimulation were not performed.  Abnormality -Continuous slow, generalized  IMPRESSION: This study is suggestive of profound diffuse encephalopathy, nonspecific to etiology. No seizures or epileptiform discharges were seen throughout the recording.

## 2019-07-14 NOTE — Progress Notes (Addendum)
Nutrition Follow-up  DOCUMENTATION CODES:   Not applicable  INTERVENTION:    Vital AF 1.2 at 70 ml/h (1680 ml per day)   Provides 2016 kcal, 126 gm protein, 1362 ml free water daily  NUTRITION DIAGNOSIS:   Increased nutrient needs related to acute illness(COVID PNA) as evidenced by estimated needs.  GOAL:   Patient will meet greater than or equal to 90% of their needs  MONITOR:   Vent status, Labs, I & O's  REASON FOR ASSESSMENT:   Consult Enteral/tube feeding initiation and management  ASSESSMENT:   63 yo male admitted with COVID PNA, AKI. PMH includes HTN, MI.   Discussed patient in ICU rounds and with RN today. CRRT off. Received MD Consult for TF initiation and management. Patient is currently receiving Vital High Protein at 40 ml/h with Pro-stat 30 ml BID, tolerating well.  Sodium elevated, free water flushes added 300 ml every 6 hours.  Patient remains intubated on ventilator support. MV: 8.3 L/min Temp (24hrs), Avg:99.5 F (37.5 C), Min:97.3 F (36.3 C), Max:102.2 F (39 C)  Propofol: off  Labs reviewed. Sodium 152 (H), BUN 104 (H), creatinine 2.47 (H), phosphorus 4.3 WDL, potassium 5 WDL CBG's: [id number]  Medications reviewed and include decadron, colace, novolog, lokelma, neosynephrine.   Weight stable since admission I/O +5.95 L since admission  NUTRITION - FOCUSED PHYSICAL EXAM:  deferred  Diet Order:   Diet Order            Diet NPO time specified  Diet effective now              EDUCATION NEEDS:   Not appropriate for education at this time  Skin:  Skin Assessment: Reviewed RN Assessment  Last BM:  no BM documented  Height:   Ht Readings from Last 1 Encounters:  07/20/2019 5\' 6"  (1.676 m)    Weight:   Wt Readings from Last 1 Encounters:  07/14/19 81.5 kg    Ideal Body Weight:  64.5 kg  BMI:  Body mass index is 29 kg/m.  Estimated Nutritional Needs:   Kcal:  2050  Protein:  120-145 gm  Fluid:  >/= 2  L    Molli Barrows, RD, LDN, [facility] Pager [phone] After Hours Pager [phone]

## 2019-07-14 NOTE — Progress Notes (Signed)
Noted patient to have agonal breathing at start of shift. E-link MD notified.

## 2019-07-14 NOTE — Progress Notes (Signed)
Reason for consult: Seizure  Subjective: Patient continues to have tremor like activity overnight concerning for seizures.    ROS: Unable to obtain due to poor mental status  Examination  Vital signs in last 24 hours: Temp:  [97.3 F (36.3 C)-102.2 F (39 C)] 97.5 F (36.4 C) (11/20 1300) Pulse Rate:  [59-96] 96 (11/20 1300) Resp:  [11-23] 12 (11/20 1300) BP: (91-131)/(70-90) 108/72 (11/20 1300) SpO2:  [88 %-100 %] 94 % (11/20 1300) FiO2 (%):  [40 %-60 %] 50 % (11/20 1200) Weight:  [81.5 kg] 81.5 kg (11/20 0347)  General: lying in bed, not in apparent distress CVS: pulse-normal rate and rhythm RS: breathing comfortably, intubated Extremities: normal   Neuro: MS: Opens eyes to noxious stimuli, does not follow commands CN: pupils equal and reactive, unable to elicit oculocephalics, corneal reflex intact, gag reflex intact  Motor: Does not withdraw to noxious stimuli in all 4 extremities, does have mild myoclonus on stimulation Reflexes: 1+ bilaterally over patella, biceps, plantars: flexor   Basic Metabolic Panel: Recent Labs  Lab 07/12/19 0117 07/12/19 0324 07/12/19 0547 07/12/19 1600 07/13/19 0432 07/13/19 0433 07/13/19 1447 07/13/19 2025 07/13/19 2127 07/14/19 0404 07/14/19 0405  NA 147* 147* 145 145 147*  --   --   --  151*  --  152*  K 4.7 4.0 3.9 5.0 5.7*  --   --   --  5.0  --  5.0  CL 112* 108  --  109 110  --   --   --   --   --  119*  CO2 16* 21*  --  21* 23  --   --   --   --   --  22  GLUCOSE 270* 205*  --  152* 229*  --   --   --   --   --  235*  BUN 172* 150*  --  92* 100*  --   --   --   --   --  104*  CREATININE 8.37* 6.77*  --  4.29* 3.76*  --   --   --   --   --  2.47*  CALCIUM 7.3* 7.5*  --  7.8* 7.6*  --   --   --   --   --  7.7*  MG 3.0*  --   --   --   --  3.0* 3.0* 3.1*  --  3.1*  --   PHOS 8.3* 5.4*  --  6.5* 7.4*  --  4.3 3.4  --   --  4.3    CBC: Recent Labs  Lab 06/27/2019 1220 07/13/2019 2246 07/12/19 0324 07/12/19 0547  07/13/19 2127 07/14/19 0404  WBC 9.5  --  5.4  --   --  7.9  NEUTROABS 8.4*  --   --   --   --  7.2  HGB 15.3 12.6* 13.7 12.2* 12.9* 12.5*  HCT 48.2 37.0* 40.9 36.0* 38.0* 39.0  MCV 72.9*  --  70.8*  --   --  72.8*  PLT 352  --  269  --   --  319     Coagulation Studies: Recent Labs    07/12/19 0117  LABPROT 16.4*  INR 1.3*    Imaging CT head without contrast 11/90/2020: No acute intracranial abnormality.  Mild chronic small vessel ischemia.   ASSESSMENT AND PLAN 63 year old male who was admitted with COVID-19 infection.  He was noted to have jerking/tremor-like movements concerning for seizures.  Therefore  neurology was consulted.  EEG overnight showed slowing consistent with encephalopathy, no seizures or epileptiform discharges were seen.  COVID-19 infection Encephalopathy Seizure-like activity -LTM EEG so far has not recorded any seizures.  It is possible that these episodes are stimulation induced myoclonus versus less likely focal motor seizures.  Recommendations -We will obtain MRI brain without contrast to evaluate for any acute intracranial abnormality.  Unable to administer contrast due to poor renal function. -We will continue EEG to continue to monitor for seizure activity. -We will defer starting antiepileptic drugs unless there is high suspicion for seizures or EEG shows interictal/ictal activity. -Continue management of COVID-19 per primary team -Continue to wean sedation per ICU team -As needed IV Ativan 2 mg for generalized tonic-clonic seizure lasting more than 2 minutes of focal seizure lasting more than 5 minutes.   CRITICAL CARE Performed by: Lora Havens   Total critical care time: 31 minutes  Critical care time was exclusive of separately billable procedures and treating other patients.  Critical care was necessary to treat or prevent imminent or life-threatening deterioration.  Critical care was time spent personally by me on the  following activities: development of treatment plan with patient and/or surrogate as well as nursing, discussions with consultants, evaluation of patient's response to treatment, examination of patient, obtaining history from patient or surrogate, ordering and performing treatments and interventions, ordering and review of laboratory studies, ordering and review of radiographic studies, pulse oximetry and re-evaluation of patient's condition.

## 2019-07-14 NOTE — Progress Notes (Signed)
[facility] Progress Note Patient Name: Vallen Linthicum DOB: [birth year] MRN: [id number]   Date of Service  07/14/2019  HPI/Events of Note  Ventilator asynchrony - Persists in spite of better temperature control and increased sedation. May require NMB.   eICU Interventions  Will order: 1. Portable CXR STAT. 2. Will ask ground team to evaluate the patient at bedside prior to committing to NMB.     Intervention Category Major Interventions: Respiratory failure - evaluation and management  Sommer,Steven Eugene 07/14/2019, 4:46 AM

## 2019-07-14 NOTE — Progress Notes (Signed)
Venous duplex lower ext  has been completed. Refer to [facility] under chart review to view preliminary results.   07/14/2019  3:45 PM Craig Mccoy, Bonnye Fava

## 2019-07-15 ENCOUNTER — Inpatient Hospital Stay (HOSPITAL_COMMUNITY): Payer: BC Managed Care – PPO

## 2019-07-15 DIAGNOSIS — N179 Acute kidney failure, unspecified: Secondary | ICD-10-CM | POA: Diagnosis not present

## 2019-07-15 DIAGNOSIS — J9601 Acute respiratory failure with hypoxia: Secondary | ICD-10-CM | POA: Diagnosis not present

## 2019-07-15 DIAGNOSIS — U071 COVID-19: Secondary | ICD-10-CM | POA: Diagnosis not present

## 2019-07-15 DIAGNOSIS — G9340 Encephalopathy, unspecified: Secondary | ICD-10-CM | POA: Diagnosis not present

## 2019-07-15 LAB — CBC WITH DIFFERENTIAL/PLATELET
Abs Immature Granulocytes: 0.16 10*3/uL — ABNORMAL HIGH (ref 0.00–0.07)
Basophils Absolute: 0 10*3/uL (ref 0.0–0.1)
Basophils Relative: 0 %
Eosinophils Absolute: 0 10*3/uL (ref 0.0–0.5)
Eosinophils Relative: 0 %
HCT: 37.5 % — ABNORMAL LOW (ref 39.0–52.0)
Hemoglobin: 11.6 g/dL — ABNORMAL LOW (ref 13.0–17.0)
Immature Granulocytes: 2 %
Lymphocytes Relative: 2 %
Lymphs Abs: 0.2 10*3/uL — ABNORMAL LOW (ref 0.7–4.0)
MCH: 23.5 pg — ABNORMAL LOW (ref 26.0–34.0)
MCHC: 30.9 g/dL (ref 30.0–36.0)
MCV: 76.1 fL — ABNORMAL LOW (ref 80.0–100.0)
Monocytes Absolute: 0.3 10*3/uL (ref 0.1–1.0)
Monocytes Relative: 5 %
Neutro Abs: 6.1 10*3/uL (ref 1.7–7.7)
Neutrophils Relative %: 91 %
Platelets: 294 10*3/uL (ref 150–400)
RBC: 4.93 MIL/uL (ref 4.22–5.81)
RDW: 17.2 % — ABNORMAL HIGH (ref 11.5–15.5)
WBC: 6.7 10*3/uL (ref 4.0–10.5)
nRBC: 0 % (ref 0.0–0.2)

## 2019-07-15 LAB — BASIC METABOLIC PANEL
Anion gap: 8 (ref 5–15)
BUN: 136 mg/dL — ABNORMAL HIGH (ref 8–23)
CO2: 23 mmol/L (ref 22–32)
Calcium: 7.2 mg/dL — ABNORMAL LOW (ref 8.9–10.3)
Chloride: 113 mmol/L — ABNORMAL HIGH (ref 98–111)
Creatinine, Ser: 2.47 mg/dL — ABNORMAL HIGH (ref 0.61–1.24)
GFR calc Af Amer: 31 mL/min — ABNORMAL LOW (ref >60)
GFR calc non Af Amer: 27 mL/min — ABNORMAL LOW (ref >60)
Glucose, Bld: 397 mg/dL — ABNORMAL HIGH (ref 70–99)
Potassium: 4.5 mmol/L (ref 3.5–5.1)
Sodium: 144 mmol/L (ref 135–145)

## 2019-07-15 LAB — RENAL FUNCTION PANEL
Albumin: 1.6 g/dL — ABNORMAL LOW (ref 3.5–5.0)
Anion gap: 11 (ref 5–15)
BUN: 124 mg/dL — ABNORMAL HIGH (ref 8–23)
CO2: 24 mmol/L (ref 22–32)
Calcium: 7.4 mg/dL — ABNORMAL LOW (ref 8.9–10.3)
Chloride: 114 mmol/L — ABNORMAL HIGH (ref 98–111)
Creatinine, Ser: 2.43 mg/dL — ABNORMAL HIGH (ref 0.61–1.24)
GFR calc Af Amer: 31 mL/min — ABNORMAL LOW (ref >60)
GFR calc non Af Amer: 27 mL/min — ABNORMAL LOW (ref >60)
Glucose, Bld: 396 mg/dL — ABNORMAL HIGH (ref 70–99)
Phosphorus: 5 mg/dL — ABNORMAL HIGH (ref 2.5–4.6)
Potassium: 4.8 mmol/L (ref 3.5–5.1)
Sodium: 149 mmol/L — ABNORMAL HIGH (ref 135–145)

## 2019-07-15 LAB — GLUCOSE, CAPILLARY
Glucose-Capillary: 271 mg/dL — ABNORMAL HIGH (ref 70–99)
Glucose-Capillary: 328 mg/dL — ABNORMAL HIGH (ref 70–99)
Glucose-Capillary: 361 mg/dL — ABNORMAL HIGH (ref 70–99)
Glucose-Capillary: 374 mg/dL — ABNORMAL HIGH (ref 70–99)
Glucose-Capillary: 377 mg/dL — ABNORMAL HIGH (ref 70–99)
Glucose-Capillary: 391 mg/dL — ABNORMAL HIGH (ref 70–99)

## 2019-07-15 LAB — LACTATE DEHYDROGENASE: LDH: 364 U/L — ABNORMAL HIGH (ref 98–192)

## 2019-07-15 LAB — FERRITIN: Ferritin: 1399 ng/mL — ABNORMAL HIGH (ref 24–336)

## 2019-07-15 LAB — C-REACTIVE PROTEIN: CRP: 19.2 mg/dL — ABNORMAL HIGH (ref <1.0)

## 2019-07-15 LAB — D-DIMER, QUANTITATIVE: D-Dimer, Quant: 1.55 ug/mL-FEU — ABNORMAL HIGH (ref 0.00–0.50)

## 2019-07-15 LAB — MAGNESIUM: Magnesium: 2.9 mg/dL — ABNORMAL HIGH (ref 1.7–2.4)

## 2019-07-15 MED ORDER — INSULIN ASPART 100 UNIT/ML ~~LOC~~ SOLN: 0.0000 [IU] | SUBCUTANEOUS | Status: DC

## 2019-07-15 MED ORDER — INSULIN ASPART 100 UNIT/ML ~~LOC~~ SOLN
0.0000 [IU] | SUBCUTANEOUS | Status: DC
  Administered 2019-07-15 (×2): 15 [IU] via SUBCUTANEOUS
  Administered 2019-07-15: 11 [IU] via SUBCUTANEOUS

## 2019-07-15 MED ORDER — PANTOPRAZOLE SODIUM 40 MG PO PACK
40.0000 mg | PACK | Freq: Every day | ORAL | Status: DC
  Administered 2019-07-15 – 2019-07-16 (×2): 40 mg
  Filled 2019-07-15 (×3): qty 20

## 2019-07-15 MED ORDER — FUROSEMIDE 10 MG/ML IJ SOLN
40.0000 mg | Freq: Four times a day (QID) | INTRAMUSCULAR | Status: AC
  Administered 2019-07-15 (×3): 40 mg via INTRAVENOUS
  Filled 2019-07-15 (×3): qty 4

## 2019-07-15 MED ORDER — METOLAZONE 5 MG PO TABS
10.0000 mg | ORAL_TABLET | Freq: Once | ORAL | Status: AC
  Administered 2019-07-15: 10 mg via ORAL
  Filled 2019-07-15: qty 1
  Filled 2019-07-15: qty 2

## 2019-07-15 MED ORDER — FREE WATER
300.0000 mL | Status: DC
  Administered 2019-07-15 – 2019-07-16 (×6): 300 mL

## 2019-07-15 NOTE — Progress Notes (Signed)
NAME:  Craig Mccoy, MRN:  [id number], DOB:  [birth year], LOS: 4 ADMISSION DATE:  07/18/2019, CONSULTATION DATE:  11/17 REFERRING MD:  Rogene Houston, CHIEF COMPLAINT:  Acute respiratory failure   Brief History   Craig Mccoy is a 63 yo male, COVID positive, who was transferred from [facility] to [facility] for iHD.   History of present illness   [name] a 63 y.o.malewho has a PMH including but not limited to hypertension and prior MI per chart review (see "past medical history" for rest). Hepresented to [name] 11/17 with dyspnea and respiratory distress. He had apparently tested positive for COVID on 11/12 at an outside institution and had been in self isolation. His "lady friend" called EMS 11/17 due to above symptoms.  On EMS arrival, SpO2 was low despite supplemental O2. In ED, it was 77% before being placed on 15L NRB with improvement to 93%. Unfortunately, he deteriorated and required intubation. Repeat COVID testing returned as positive. He was also found to have multiple metabolic derangements including marked renal failure (SCr 14.63 with BUN 219), met acidosis + resp alkalosis (7.28, <19, 72), PCT 25.  Due to his potential to require iHD, he was transferred to [facility] for further evaluation and management.   Past Medical History  MI, HTN  Significant Hospital Events   11/17 - transferred to [facility] from [place]:  nephrology  Procedures:  ETT 11/17.  Echo - 11/18  Significant Diagnostic Tests:  CXR - 11/17 - bilateral opacities HS-trop - 1604>>1504 Ferritin 2490 CRP - 38 PCT - 25.01 D-Dimer 2.52, Fibrinogen > 800.  PT -16.4; INR 1.3. PTT 29.  Echo - 35% LVEF. Mild LVH. basl akinesis. G1DD CT head - no acute abnormality  Micro Data:  Blood Cx - 11/17 - NG2D  Respiratory Cx 11/17 - mod wbc, mod diplococci Urine Cx 11/17 - no growth MRSA pcr - negative COVID 11/17 - positive  Antimicrobials:  CTX - 11/17 -  Azithro - 11/17 -    Interim history/subjective:  Pt  intubated and sedated.   Objective   Blood pressure 114/66, pulse 87, temperature (!) 97.3 F (36.3 C), resp. rate 14, height '5\' 6"'$  (1.676 m), weight 84.7 kg, SpO2 98 %.    Vent Mode: PRVC FiO2 (%):  [50 %] 50 % Set Rate:  [14 bmp] 14 bmp Vt Set:  [510 mL] 510 mL PEEP:  [8 cmH20] 8 cmH20 Plateau Pressure:  [22 cmH20] 22 cmH20   Intake/Output Summary (Last 24 hours) at 07/15/2019 0827 Last data filed at 07/15/2019 0800 Gross per 24 hour  Intake 5119.15 ml  Output 1510 ml  Net 3609.15 ml   Filed Weights   07/13/19 0500 07/14/19 0347 07/15/19 0422  Weight: 81.2 kg 81.5 kg 84.7 kg    Examination: General: sedated, intubated.  Does not respond to voice or touch.  HENT: intubated. Normal oral mucosa.  Lungs: LCTAB. Ventilator sounds auscultated.   Cardiovascular: RRR.  1+ radial pulse.  Abdomen: does not respond to palpation. Normal bowel sounds Extremities: no edema.  Arms and legs cold to touch b/l. Pt was on hypothermic blanket earlier this am.  Neuro: sedated. unable to assess neuro status dt pt condition.   Resolved Hospital Problem list     Assessment & Plan:     COVID-19 pneumonia Acute respiratory failure with hypoxia  -in setting of above P -Wean vent support as able - Dexamethasone x 10 d - Remdesivir x 5d - SQ heparin for anticoag  Acute encephalopathy  - ? Seizure like activity 11/18 - no seizures reported on EEG P - neurology is following  -Delirium precautions, wean sedation   Shock, improving -septic shock vs component of cardiogenic shock in setting of acute systolic heart failure P - follow up micro; no growth BCx at present - continue course of rocephin, completed course of azithromycin  - MAP goal > 65. Will dc neo. If pressors are needed moving forward, favor NE over neo in setting of acyte systolic heart failure   Acute systolic heart failure - EF 35%, unclear if new. Cardiology consulted,  Elevated troponin - peak 1664. 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   P -Strict  I/O  -MAP goal as above   AKI -off CRRT  P -Trend renal indices and electrolytes   Hyperglycemia  -has been > 300, on steroids, on dextrose gtt. Has remained on sensitive ssi P -dc dextrose gtt -will increase to moderate ssi    Best practice:  Diet: EN Pain/Anxiety/Delirium protocol (if indicated): PAD protocol VAP protocol (if indicated): in place DVT prophylaxis: subQ heparin GI prophylaxis: protonix Glucose control:  moderateSSI Mobility: Bedrest Code Status: full Family Communication: pending 11/21 Disposition: ICU  Labs   CBC: Recent Labs  Lab 06/29/2019 1220  07/12/19 0324 07/12/19 0547 07/13/19 2127 07/14/19 0404 07/15/19 0420  WBC 9.5  --  5.4  --   --  7.9 6.7  NEUTROABS 8.4*  --   --   --   --  7.2 6.1  HGB 15.3   < > 13.7 12.2* 12.9* 12.5* 11.6*  HCT 48.2   < > 40.9 36.0* 38.0* 39.0 37.5*  MCV 72.9*  --  70.8*  --   --  72.8* 76.1*  PLT 352  --  269  --   --  319 294   < > = values in this interval not displayed.    Basic Metabolic Panel: Recent Labs  Lab 07/12/19 0324  07/12/19 1600 07/13/19 0432 07/13/19 0433 07/13/19 1447 07/13/19 2025 07/13/19 2127 07/14/19 0404 07/14/19 0405 07/14/19 1700 07/15/19 0420  NA 147*   < > 145 147*  --   --   --  151*  --  152*  --  149*  K 4.0   < > 5.0 5.7*  --   --   --  5.0  --  5.0  --  4.8  CL 108  --  109 110  --   --   --   --   --  119*  --  114*  CO2 21*  --  21* 23  --   --   --   --   --  22  --  24  GLUCOSE 205*  --  152* 229*  --   --   --   --   --  235*  --  396*  BUN 150*  --  92* 100*  --   --   --   --   --  104*  --  124*  CREATININE 6.77*  --  4.29* 3.76*  --   --   --   --   --  2.47*  --  2.43*  CALCIUM 7.5*  --  7.8* 7.6*  --   --   --   --   --  7.7*  --  7.4*  MG  --   --   --   --  3.0* 3.0* 3.1*  --  3.1*  --  2.9*  --   PHOS 5.4*  --  6.5* 7.4*  --  4.3 3.4  --   --  4.3 5.5* 5.0*   < > = values in this interval not displayed.   GFR:  Estimated Creatinine Clearance: 31.4 mL/min (A) (by C-G formula based on SCr of 2.43 mg/dL (H)). Recent Labs  Lab 07/06/2019 1220 07/04/2019 1357 07/18/2019 1600 07/12/19 0324 07/14/19 0404 07/15/19 0420  PROCALCITON 25.01  --   --   --   --   --   WBC 9.5  --   --  5.4 7.9 6.7  LATICACIDVEN  --  3.8* 3.7*  --   --   --     Liver Function Tests: Recent Labs  Lab 07/09/2019 1220 07/12/19 0324 07/12/19 1600 07/13/19 0432 07/14/19 0405 07/15/19 0420  AST 76*  --   --   --   --   --   ALT 65*  --   --   --   --   --   ALKPHOS 73  --   --   --   --   --   BILITOT 0.9  --   --   --   --   --   PROT 8.8*  --   --   --   --   --   ALBUMIN 3.2* 1.9* 2.4* 2.1* 1.8* 1.6*   No results for input(s): LIPASE, AMYLASE in the last 168 hours. Recent Labs  Lab 07/18/2019 1357  AMMONIA 30    ABG    Component Value Date/Time   PHART 7.426 07/13/2019 2127   PCO2ART 29.7 (L) 07/13/2019 2127   PO2ART 54.0 (L) 07/13/2019 2127   HCO3 19.1 (L) 07/13/2019 2127   TCO2 20 (L) 07/13/2019 2127   ACIDBASEDEF 3.0 (H) 07/13/2019 2127   O2SAT 86.0 07/13/2019 2127     Coagulation Profile: Recent Labs  Lab 07/12/19 0117  INR 1.3*    Cardiac Enzymes: No results for input(s): CKTOTAL, CKMB, CKMBINDEX, TROPONINI in the last 168 hours.  HbA1C: Hgb A1c MFr Bld  Date/Time Value Ref Range Status  07/12/2019 05:00 AM 7.0 (H) 4.8 - 5.6 % Final    Comment:    (NOTE) Pre diabetes:          5.7%-6.4% Diabetes:              >6.4% Glycemic control for   <7.0% adults with diabetes     CBG: Recent Labs  Lab 07/14/19 1701 07/14/19 2038 07/15/19 0026 07/15/19 0311 07/15/19 0824  GLUCAP 329* 308* 271* 377* 391*    CRITICAL CARE Performed by: Cristal Generous  Attending note:  63 year old male with COVID pneumonia who presents to PCCM with respiratory failure related to COVID-19 infection.  On exam, lungs with coarse BS diffusely.  I reviewed CXR myself, ETT is in a good position and infiltrate  noted.  Discussed with PCCM-NP.  Will order AM labs.  Begin active diureses today.  KVO IVF.  D/C D5W and increase insulin.  Increase free water.  BMET in AM.  Goal is 40 and 5 today.  Begin weaning if able to tolerate 40 and 5.  D/C sedation.  Monitor for seizure.  PCCM will continue to follow.  The patient is critically ill with multiple organ systems failure and requires high complexity decision making for assessment and support, frequent evaluation and titration of therapies, application of advanced monitoring technologies and extensive interpretation of  multiple databases.   Critical Care Time devoted to patient care services described in this note is  33  Minutes. This time reflects time of care of this signee Dr Jennet Maduro. This critical care time does not reflect procedure time, or teaching time or supervisory time of PA/NP/Med student/Med Resident etc but could involve care discussion time.  Rush Farmer, M.D. [facility]/Critical Care Medicine.

## 2019-07-15 NOTE — Progress Notes (Signed)
[place] Progress Note Patient Name: Craig Mccoy DOB: [birth year] MRN: [id number]   Date of Service  07/15/2019  HPI/Events of Note  Episode of SVT with ventricular rate = 185. Now spontaneously converted to sinus tachycardia with HR = 1.0-113. BP = 109/78.  eICU Interventions  Will order: 1. BMP and Mg++ level STAT.      Intervention Category Major Interventions: Arrhythmia - evaluation and management  Sommer,Steven Eugene 07/15/2019, 8:10 PM

## 2019-07-15 NOTE — Progress Notes (Addendum)
Subjective:  Patient in bed intubated, breathing above the vent. No tremors noted during exam.  Examination Vital signs in last 24 hours: Temp:  [97.3 F (36.3 C)-98.2 F (36.8 C)] 97.3 F (36.3 C) (11/21 0830) Pulse Rate:  [56-109] 88 (11/21 0903) Resp:  [11-24] 14 (11/21 0903) BP: (91-133)/(66-90) 133/69 (11/21 0830) SpO2:  [81 %-100 %] 97 % (11/21 0903) FiO2 (%):  [50 %] 50 % (11/21 0903) Weight:  [84.7 kg] 84.7 kg (11/21 0422)  General: lying in bed, not in apparent distress CVS: pulse-normal rate and rhythm RS: breathing comfortably, intubated Extremities: normal   Neuro: MS: Opens eyes to noxious stimuli, does not follow commands, does not respond to voice. CN: 2 mm bilaterally and sluggishly responsive, pupils equal and reactive, negative doll's eyes, corneal reflex intact, gag reflex intact  Motor: Does not withdraw to noxious stimuli in any extremity   Basic Metabolic Panel: Recent Labs  Lab 07/12/19 0324  07/12/19 1600 07/13/19 0432 07/13/19 0433 07/13/19 1447 07/13/19 2025 07/13/19 2127 07/14/19 0404 07/14/19 0405 07/14/19 1700 07/15/19 0420  NA 147*   < > 145 147*  --   --   --  151*  --  152*  --  149*  K 4.0   < > 5.0 5.7*  --   --   --  5.0  --  5.0  --  4.8  CL 108  --  109 110  --   --   --   --   --  119*  --  114*  CO2 21*  --  21* 23  --   --   --   --   --  22  --  24  GLUCOSE 205*  --  152* 229*  --   --   --   --   --  235*  --  396*  BUN 150*  --  92* 100*  --   --   --   --   --  104*  --  124*  CREATININE 6.77*  --  4.29* 3.76*  --   --   --   --   --  2.47*  --  2.43*  CALCIUM 7.5*  --  7.8* 7.6*  --   --   --   --   --  7.7*  --  7.4*  MG  --   --   --   --  3.0* 3.0* 3.1*  --  3.1*  --  2.9*  --   PHOS 5.4*  --  6.5* 7.4*  --  4.3 3.4  --   --  4.3 5.5* 5.0*   < > = values in this interval not displayed.    CBC: Recent Labs  Lab 07/19/2019 1220  07/12/19 0324 07/12/19 0547 07/13/19 2127 07/14/19 0404 07/15/19 0420  WBC 9.5  --   5.4  --   --  7.9 6.7  NEUTROABS 8.4*  --   --   --   --  7.2 6.1  HGB 15.3   < > 13.7 12.2* 12.9* 12.5* 11.6*  HCT 48.2   < > 40.9 36.0* 38.0* 39.0 37.5*  MCV 72.9*  --  70.8*  --   --  72.8* 76.1*  PLT 352  --  269  --   --  319 294   < > = values in this interval not displayed.    Imaging CT head without contrast 11/90/2020: No acute intracranial abnormality.  Mild  chronic small vessel ischemia.  MRI brain from today (11/21): No acute abnormality. Mild chronic microvascular ischemic change in the white matter  LTM 07/14/2019 1151 to 07/15/2019 0700 IMPRESSION: This study issuggestive of profound diffuse encephalopathy, nonspecific to etiology.No seizures or epileptiform discharges were seen throughout the recording. EEG appears similar to prior study.  Medications: . chlorhexidine gluconate (MEDLINE KIT)  15 mL Mouth Rinse BID  . Chlorhexidine Gluconate Cloth  6 each Topical Daily  . dexamethasone (DECADRON) injection  6 mg Intravenous Daily  . docusate  100 mg Per Tube BID  . free water  300 mL Per Tube Q4H  . furosemide  40 mg Intravenous Q6H  . heparin  7,500 Units Subcutaneous Q8H  . insulin aspart  0-15 Units Subcutaneous Q4H  . mouth rinse  15 mL Mouth Rinse 10 times per day  . pantoprazole sodium  40 mg Per Tube Daily  . sodium zirconium cyclosilicate  10 g Oral BID    Laurey Morale, MSN, NP-C [facility] [phone]  Assessment: 63 year old male who was admitted with acute hypoxic respiratory failure secondary to COVID-19 PNA, requiring intubation.  He was noted to have jerking/tremor-like movements concerning for seizures.  Therefore neurology was consulted.  EEG overnight showed slowing consistent with a profound diffuse encephalopathy, no seizures or epileptiform discharges were seen. -- Encephalopathy with questionable seizure activity on 11/18 - EEG showed no seizures -- Seizure-like activity -- LTM EEG continues to not record any seizures.   It is possible that these episodes are stimulation induced myoclonus versus less likely focal motor seizures. -- MRI brain obtained today shows no acute abnormality. Mild chronic microvascular ischemic changes are seen within the white matter. -- LTM was re-started after MRI -- ICU conditions: COVID-19 infection and septic shock vs component of cardiogenic shock in setting of acute systolic heart failure. Also with AKI, now off CRRT. Has been hyperglycemic.   Recommendations: -We will continue LTM  to continue to monitor for possible seizure activity. -We will defer starting antiepileptic drugs unless there is high suspicion for seizures or EEG shows interictal/ictal activity. -Continue management of COVID-19 per primary team. On Remdesivir x 5 days and Dexamethasone x 10 days, as well as SQ heparin for anticoagulation -Continue to correct metabolic derangements, per primary team -Continue to wean sedation per ICU team -As needed IV Ativan 2 mg for generalized tonic-clonic seizure lasting more than 2 minutes or focal seizure lasting more than 5 minutes.   Electronically signed: Dr. Kerney Elbe

## 2019-07-15 NOTE — Progress Notes (Signed)
LTM EEG hooked up and running - no initial skin breakdown - push button tested - neuro notified.  

## 2019-07-15 NOTE — Progress Notes (Signed)
LTM EEG discontinued - no skin breakdown at unhook.   

## 2019-07-15 NOTE — Progress Notes (Signed)
[facility] Progress Note Patient Name: Craig Mccoy DOB: 02/04/1956 MRN: [id number]   Date of Service  07/15/2019  HPI/Events of Note  Hypoxemia - Sat = 86% to 88%  eICU Interventions  Will order: 1. Increase PEEP to 10. 2. Titrate FiO2 to keep sat >= 92% as already ordered.      Intervention Category Major Interventions: Hypoxemia - evaluation and management;Respiratory failure - evaluation and management  Lysle Dingwall 07/15/2019, 8:51 PM

## 2019-07-15 NOTE — Procedures (Addendum)
Patient Name: Craig Mccoy  MRN: [id number]  Epilepsy Attending: Lora Havens  Referring Physician/Provider: Dr Trevor Mace Duration: 07/14/2019 1151 to 07/15/2019 1614  Patient history: 63 year old male with COVID-19 and altered mental status, seizure-like episodes.  EEG to evaluate for seizures.  Level of alertness: Sedated/comatose  AEDs during EEG study: Versed  Technical aspects: This EEG study was done with scalp electrodes positioned according to the 10-20 International system of electrode placement. Electrical activity was acquired at a sampling rate of 500Hz  and reviewed with a high frequency filter of 70Hz  and a low frequency filter of 1Hz . EEG data were recorded continuously and digitally stored.   Description: EEG showed continuous generalized 2 to 5 Hz theta-delta slowing.  EEG was reactive to tactile stimuli.  Hyperventilation and photic stimulation were not performed.  Abnormality -Continuous slow, generalized  IMPRESSION: This study is suggestive of profound diffuse encephalopathy, nonspecific to etiology. No seizures or epileptiform discharges were seen throughout the recording.  EEG appears similar to prior study.

## 2019-07-15 NOTE — Progress Notes (Signed)
Pt transported to/from MRI via vent w/ no apparent complications. 

## 2019-07-16 ENCOUNTER — Inpatient Hospital Stay (HOSPITAL_COMMUNITY): Payer: BC Managed Care – PPO

## 2019-07-16 DIAGNOSIS — J9601 Acute respiratory failure with hypoxia: Secondary | ICD-10-CM | POA: Diagnosis not present

## 2019-07-16 DIAGNOSIS — U071 COVID-19: Secondary | ICD-10-CM | POA: Diagnosis not present

## 2019-07-16 DIAGNOSIS — N179 Acute kidney failure, unspecified: Secondary | ICD-10-CM | POA: Diagnosis not present

## 2019-07-16 DIAGNOSIS — G931 Anoxic brain damage, not elsewhere classified: Secondary | ICD-10-CM

## 2019-07-16 LAB — POCT I-STAT 7, (LYTES, BLD GAS, ICA,H+H)
Acid-base deficit: 1 mmol/L (ref 0.0–2.0)
Bicarbonate: 25 mmol/L (ref 20.0–28.0)
Calcium, Ion: 1.15 mmol/L (ref 1.15–1.40)
HCT: 34 % — ABNORMAL LOW (ref 39.0–52.0)
Hemoglobin: 11.6 g/dL — ABNORMAL LOW (ref 13.0–17.0)
O2 Saturation: 89 %
Patient temperature: 36.8
Potassium: 4.5 mmol/L (ref 3.5–5.1)
Sodium: 147 mmol/L — ABNORMAL HIGH (ref 135–145)
TCO2: 26 mmol/L (ref 22–32)
pCO2 arterial: 46.2 mmHg (ref 32.0–48.0)
pH, Arterial: 7.341 — ABNORMAL LOW (ref 7.350–7.450)
pO2, Arterial: 60 mmHg — ABNORMAL LOW (ref 83.0–108.0)

## 2019-07-16 LAB — POCT ACTIVATED CLOTTING TIME: Activated Clotting Time: 0 seconds

## 2019-07-16 LAB — GLUCOSE, CAPILLARY
Glucose-Capillary: 174 mg/dL — ABNORMAL HIGH (ref 70–99)
Glucose-Capillary: 178 mg/dL — ABNORMAL HIGH (ref 70–99)
Glucose-Capillary: 178 mg/dL — ABNORMAL HIGH (ref 70–99)
Glucose-Capillary: 225 mg/dL — ABNORMAL HIGH (ref 70–99)
Glucose-Capillary: 277 mg/dL — ABNORMAL HIGH (ref 70–99)
Glucose-Capillary: 304 mg/dL — ABNORMAL HIGH (ref 70–99)
Glucose-Capillary: 335 mg/dL — ABNORMAL HIGH (ref 70–99)
Glucose-Capillary: 339 mg/dL — ABNORMAL HIGH (ref 70–99)
Glucose-Capillary: 370 mg/dL — ABNORMAL HIGH (ref 70–99)
Glucose-Capillary: 401 mg/dL — ABNORMAL HIGH (ref 70–99)

## 2019-07-16 LAB — RENAL FUNCTION PANEL
Albumin: 1.8 g/dL — ABNORMAL LOW (ref 3.5–5.0)
Anion gap: 11 (ref 5–15)
BUN: 138 mg/dL — ABNORMAL HIGH (ref 8–23)
CO2: 25 mmol/L (ref 22–32)
Calcium: 7.9 mg/dL — ABNORMAL LOW (ref 8.9–10.3)
Chloride: 112 mmol/L — ABNORMAL HIGH (ref 98–111)
Creatinine, Ser: 2.39 mg/dL — ABNORMAL HIGH (ref 0.61–1.24)
GFR calc Af Amer: 32 mL/min — ABNORMAL LOW (ref >60)
GFR calc non Af Amer: 28 mL/min — ABNORMAL LOW (ref >60)
Glucose, Bld: 295 mg/dL — ABNORMAL HIGH (ref 70–99)
Phosphorus: 4.1 mg/dL (ref 2.5–4.6)
Potassium: 4.3 mmol/L (ref 3.5–5.1)
Sodium: 148 mmol/L — ABNORMAL HIGH (ref 135–145)

## 2019-07-16 LAB — COMPREHENSIVE METABOLIC PANEL
ALT: 32 U/L (ref 0–44)
AST: 27 U/L (ref 15–41)
Albumin: 1.8 g/dL — ABNORMAL LOW (ref 3.5–5.0)
Alkaline Phosphatase: 92 U/L (ref 38–126)
Anion gap: 11 (ref 5–15)
BUN: 137 mg/dL — ABNORMAL HIGH (ref 8–23)
CO2: 27 mmol/L (ref 22–32)
Calcium: 8 mg/dL — ABNORMAL LOW (ref 8.9–10.3)
Chloride: 112 mmol/L — ABNORMAL HIGH (ref 98–111)
Creatinine, Ser: 2.39 mg/dL — ABNORMAL HIGH (ref 0.61–1.24)
GFR calc Af Amer: 32 mL/min — ABNORMAL LOW (ref >60)
GFR calc non Af Amer: 28 mL/min — ABNORMAL LOW (ref >60)
Glucose, Bld: 302 mg/dL — ABNORMAL HIGH (ref 70–99)
Potassium: 4.2 mmol/L (ref 3.5–5.1)
Sodium: 150 mmol/L — ABNORMAL HIGH (ref 135–145)
Total Bilirubin: <0.1 mg/dL — ABNORMAL LOW (ref 0.3–1.2)
Total Protein: 5.9 g/dL — ABNORMAL LOW (ref 6.5–8.1)

## 2019-07-16 LAB — CBC WITH DIFFERENTIAL/PLATELET
Abs Immature Granulocytes: 0.56 10*3/uL — ABNORMAL HIGH (ref 0.00–0.07)
Basophils Absolute: 0 10*3/uL (ref 0.0–0.1)
Basophils Relative: 0 %
Eosinophils Absolute: 0 10*3/uL (ref 0.0–0.5)
Eosinophils Relative: 0 %
HCT: 38 % — ABNORMAL LOW (ref 39.0–52.0)
Hemoglobin: 11.9 g/dL — ABNORMAL LOW (ref 13.0–17.0)
Immature Granulocytes: 6 %
Lymphocytes Relative: 2 %
Lymphs Abs: 0.2 10*3/uL — ABNORMAL LOW (ref 0.7–4.0)
MCH: 23.6 pg — ABNORMAL LOW (ref 26.0–34.0)
MCHC: 31.3 g/dL (ref 30.0–36.0)
MCV: 75.4 fL — ABNORMAL LOW (ref 80.0–100.0)
Monocytes Absolute: 0.4 10*3/uL (ref 0.1–1.0)
Monocytes Relative: 5 %
Neutro Abs: 7.8 10*3/uL — ABNORMAL HIGH (ref 1.7–7.7)
Neutrophils Relative %: 87 %
Platelets: 349 10*3/uL (ref 150–400)
RBC: 5.04 MIL/uL (ref 4.22–5.81)
RDW: 17.1 % — ABNORMAL HIGH (ref 11.5–15.5)
WBC: 9.1 10*3/uL (ref 4.0–10.5)
nRBC: 0 % (ref 0.0–0.2)

## 2019-07-16 LAB — FERRITIN: Ferritin: 1338 ng/mL — ABNORMAL HIGH (ref 24–336)

## 2019-07-16 LAB — D-DIMER, QUANTITATIVE: D-Dimer, Quant: 1.42 ug/mL-FEU — ABNORMAL HIGH (ref 0.00–0.50)

## 2019-07-16 LAB — LACTATE DEHYDROGENASE: LDH: 373 U/L — ABNORMAL HIGH (ref 98–192)

## 2019-07-16 LAB — C-REACTIVE PROTEIN: CRP: 13.1 mg/dL — ABNORMAL HIGH (ref <1.0)

## 2019-07-16 LAB — AMMONIA: Ammonia: 23 umol/L (ref 9–35)

## 2019-07-16 LAB — MAGNESIUM: Magnesium: 2.9 mg/dL — ABNORMAL HIGH (ref 1.7–2.4)

## 2019-07-16 LAB — PHOSPHORUS: Phosphorus: 4 mg/dL (ref 2.5–4.6)

## 2019-07-16 MED ORDER — INSULIN DETEMIR 100 UNIT/ML ~~LOC~~ SOLN
5.0000 [IU] | SUBCUTANEOUS | Status: DC
  Administered 2019-07-16: 5 [IU] via SUBCUTANEOUS
  Filled 2019-07-16: qty 0.05

## 2019-07-16 MED ORDER — FREE WATER
350.0000 mL | Status: DC
  Administered 2019-07-16 – 2019-07-19 (×17): 350 mL

## 2019-07-16 MED ORDER — DEXMEDETOMIDINE HCL IN NACL 400 MCG/100ML IV SOLN
0.4000 ug/kg/h | INTRAVENOUS | Status: DC
  Administered 2019-07-16: 23:00:00 1.2 ug/kg/h via INTRAVENOUS
  Administered 2019-07-16: 11:00:00 0.4 ug/kg/h via INTRAVENOUS
  Administered 2019-07-16 – 2019-07-17 (×3): 1 ug/kg/h via INTRAVENOUS
  Administered 2019-07-17 (×2): 1.2 ug/kg/h via INTRAVENOUS
  Filled 2019-07-16 (×7): qty 100

## 2019-07-16 MED ORDER — SODIUM CHLORIDE 0.9 % IV SOLN
INTRAVENOUS | Status: DC
  Administered 2019-07-16: 01:00:00 via INTRAVENOUS

## 2019-07-16 MED ORDER — INSULIN REGULAR(HUMAN) IN NACL 100-0.9 UT/100ML-% IV SOLN
INTRAVENOUS | Status: DC
  Administered 2019-07-16: 01:00:00 9.5 [IU]/h via INTRAVENOUS
  Administered 2019-07-16: 07:00:00 6.5 [IU]/h via INTRAVENOUS
  Filled 2019-07-16 (×2): qty 100

## 2019-07-16 MED ORDER — DEXTROSE 50 % IV SOLN: 0.0000 mL | INTRAVENOUS | Status: DC | PRN

## 2019-07-16 MED ORDER — DEXTROSE-NACL 5-0.45 % IV SOLN: INTRAVENOUS | Status: DC

## 2019-07-16 MED ORDER — INSULIN ASPART 100 UNIT/ML ~~LOC~~ SOLN
0.0000 [IU] | SUBCUTANEOUS | Status: DC
  Administered 2019-07-16 – 2019-07-17 (×4): 7 [IU] via SUBCUTANEOUS
  Administered 2019-07-17: 15 [IU] via SUBCUTANEOUS
  Administered 2019-07-17: 4 [IU] via SUBCUTANEOUS
  Administered 2019-07-17: 01:00:00 15 [IU] via SUBCUTANEOUS
  Administered 2019-07-17 (×3): 11 [IU] via SUBCUTANEOUS
  Administered 2019-07-18: 7 [IU] via SUBCUTANEOUS
  Administered 2019-07-18: 11 [IU] via SUBCUTANEOUS
  Administered 2019-07-18: 05:00:00 15 [IU] via SUBCUTANEOUS
  Administered 2019-07-18: 7 [IU] via SUBCUTANEOUS
  Administered 2019-07-18: 11 [IU] via SUBCUTANEOUS
  Administered 2019-07-18: 4 [IU] via SUBCUTANEOUS
  Administered 2019-07-19 (×5): 11 [IU] via SUBCUTANEOUS
  Administered 2019-07-19: 09:00:00 15 [IU] via SUBCUTANEOUS
  Administered 2019-07-20 (×6): 4 [IU] via SUBCUTANEOUS
  Administered 2019-07-21: 3 [IU] via SUBCUTANEOUS
  Administered 2019-07-21: 4 [IU] via SUBCUTANEOUS
  Administered 2019-07-21: 3 [IU] via SUBCUTANEOUS
  Administered 2019-07-21: 7 [IU] via SUBCUTANEOUS
  Administered 2019-07-21: 21:00:00 11 [IU] via SUBCUTANEOUS
  Administered 2019-07-22: 3 [IU] via SUBCUTANEOUS
  Administered 2019-07-22: 7 [IU] via SUBCUTANEOUS
  Administered 2019-07-22: 4 [IU] via SUBCUTANEOUS
  Administered 2019-07-22 (×2): 7 [IU] via SUBCUTANEOUS
  Administered 2019-07-22 (×2): 4 [IU] via SUBCUTANEOUS
  Administered 2019-07-23: 3 [IU] via SUBCUTANEOUS
  Administered 2019-07-23: 4 [IU] via SUBCUTANEOUS
  Administered 2019-07-23: 3 [IU] via SUBCUTANEOUS
  Administered 2019-07-23 – 2019-07-24 (×2): 4 [IU] via SUBCUTANEOUS
  Administered 2019-07-24: 3 [IU] via SUBCUTANEOUS
  Administered 2019-07-24: 4 [IU] via SUBCUTANEOUS
  Administered 2019-07-24 – 2019-07-25 (×3): 3 [IU] via SUBCUTANEOUS
  Administered 2019-07-25: 4 [IU] via SUBCUTANEOUS
  Administered 2019-07-25 – 2019-07-26 (×3): 3 [IU] via SUBCUTANEOUS
  Administered 2019-07-26: 4 [IU] via SUBCUTANEOUS
  Administered 2019-07-26 – 2019-07-27 (×5): 3 [IU] via SUBCUTANEOUS
  Administered 2019-07-27 – 2019-07-28 (×3): 4 [IU] via SUBCUTANEOUS

## 2019-07-16 MED ORDER — INSULIN DETEMIR 100 UNIT/ML ~~LOC~~ SOLN
18.0000 [IU] | Freq: Once | SUBCUTANEOUS | Status: AC
  Administered 2019-07-16: 18 [IU] via SUBCUTANEOUS
  Filled 2019-07-16: qty 0.18

## 2019-07-16 MED ORDER — INSULIN DETEMIR 100 UNIT/ML ~~LOC~~ SOLN
18.0000 [IU] | Freq: Every day | SUBCUTANEOUS | Status: DC
  Administered 2019-07-17: 10:00:00 18 [IU] via SUBCUTANEOUS
  Filled 2019-07-16 (×2): qty 0.18

## 2019-07-16 NOTE — Progress Notes (Addendum)
NAME:  Craig Mccoy, MRN:  [id number], DOB:  [birth year], LOS: 5 ADMISSION DATE:  07/01/2019, CONSULTATION DATE:  11/17 REFERRING MD:  Rogene Houston, CHIEF COMPLAINT:  Acute respiratory failure   Brief History   Craig Mccoy is a 63 yo male, COVID positive, who was transferred from [facility] to [facility] for iHD.   History of present illness   Craig Mccoy a 63 y.o.malewho has a PMH including but not limited to hypertension and prior MI per chart review (see "past medical history" for rest). Hepresented to [name] 11/17 with dyspnea and respiratory distress. He had apparently tested positive for COVID on 11/12 at an outside institution and had been in self isolation. His "lady friend" called EMS 11/17 due to above symptoms.  On EMS arrival, SpO2 was low despite supplemental O2. In ED, it was 77% before being placed on 15L NRB with improvement to 93%. Unfortunately, he deteriorated and required intubation. Repeat COVID testing returned as positive. He was also found to have multiple metabolic derangements including marked renal failure (SCr 14.63 with BUN 219), met acidosis + resp alkalosis (7.28, <19, 72), PCT 25.  Due to his potential to require iHD, he was transferred to [facility] for further evaluation and management.   Past Medical History  MI, HTN  Significant Hospital Events   11/17 - transferred to [facility] from [facility]:  nephrology  Procedures:  ETT 11/17.  Echo - 11/18  Significant Diagnostic Tests:  CXR - 11/17 - bilateral opacities HS-trop - 1604>>1504 Ferritin 2490 CRP - 38 PCT - 25.01 D-Dimer 2.52, Fibrinogen > 800.  PT -16.4; INR 1.3. PTT 29.  Echo - 35% LVEF. Mild LVH. basl akinesis. G1DD CT head - no acute abnormality MRI brain 11/21 No acute abnormality  Micro Data:  Blood Cx - 11/17 - NG2D  Respiratory Cx 11/17 - moraxella catarrhalis Urine Cx 11/17 - no growth MRSA pcr - negative COVID 11/17 - positive  Antimicrobials:  CTX - 11/18 x 5 days Azithro -  complete  Interim history/subjective:  Remains intubated and sedated Started on insulin gtt overnight for persistent hyperglycemia   Objective   Blood pressure (!) 164/98, pulse (!) 103, temperature 97.7 F (36.5 C), resp. rate 12, height '5\' 6"'$  (1.676 m), weight 87.4 kg, SpO2 (!) 86 %.    Vent Mode: PRVC FiO2 (%):  [50 %-60 %] 60 % Set Rate:  [14 bmp] 14 bmp Vt Set:  [510 mL] 510 mL PEEP:  [8 cmH20-10 cmH20] 10 cmH20 Plateau Pressure:  [16 cmH20-33 cmH20] 33 cmH20   Intake/Output Summary (Last 24 hours) at 07/16/2019 0912 Last data filed at 07/16/2019 0900 Gross per 24 hour  Intake 4167.35 ml  Output 2665 ml  Net 1502.35 ml   Filed Weights   07/14/19 0347 07/15/19 0422 07/16/19 0449  Weight: 81.5 kg 84.7 kg 87.4 kg    Examination: General: sedated, intubated, NAD HENT: NCAT. LTM in place.  Lungs: Diminished resp sounds  Cardiovascular: RRR.  1+ radial pulse.  Abdomen: soft round Extremities: Symmetrical  Neuro: sedated   Resolved Hospital Problem list     Assessment & Plan:   COVID-19 pneumonia Acute respiratory failure with hypoxia requiring mechanical ventilation -in setting of above -moroxella catarrhalis PNA sp abx  P -Wean vent support as able - Dexamethasone x 10 d - Remdesivir x 5d - SQ heparin for anticoag   Acute encephalopathy - ? Seizure like activity 11/18 - no seizures reported on EEG - MRI brain 11/21 No acute  abnormality -etiology CNS depressing meds vs hypoactive delirium vs COVID-related encephalopathy  P - Wean sedation -Delirium precautions -Neuro will dc LTM  Shock, improved  -septic shock vs component of cardiogenic shock in setting of acute systolic heart failure P - abx course complete  - MAP goal > 65  - off pressors  Acute systolic heart failure - EF 35%, unclear if new. Cardiology consulted,  Elevated troponin - peak 1664. 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   P -Strict  I/O  -MAP goal as above   AKI -off RRT   P -Trend renal indices and electrolytes  -nephrology off   Hypernatremia -Will increase FWF  -continue to trend  Hyperglycemia  -has been > 300, on steroids, on dextrose gtt. Has remained on sensitive ssi P -insulin gtt    Best practice:  Diet: EN Pain/Anxiety/Delirium protocol (if indicated): PAD protocol VAP protocol (if indicated): in place DVT prophylaxis: subQ heparin GI prophylaxis: protonix Glucose control:  Insulin gtt  Mobility: Bedrest Code Status: full Family Communication: Pending 11/22 Disposition: Bed available at [facility], consider transfer to [facility] ICU   Labs   CBC: Recent Labs  Lab 07/04/2019 1220  07/12/19 0324  07/13/19 2127 07/14/19 0404 07/15/19 0420 07/16/19 0237 07/16/19 0430  WBC 9.5  --  5.4  --   --  7.9 6.7  --  9.1  NEUTROABS 8.4*  --   --   --   --  7.2 6.1  --  7.8*  HGB 15.3   < > 13.7   < > 12.9* 12.5* 11.6* 11.6* 11.9*  HCT 48.2   < > 40.9   < > 38.0* 39.0 37.5* 34.0* 38.0*  MCV 72.9*  --  70.8*  --   --  72.8* 76.1*  --  75.4*  PLT 352  --  269  --   --  319 294  --  349   < > = values in this interval not displayed.    Basic Metabolic Panel: Recent Labs  Lab 07/13/19 0432  07/13/19 2025  07/14/19 0404 07/14/19 0405 07/14/19 1700 07/15/19 0420 07/15/19 2020 07/16/19 0237 07/16/19 0430  NA 147*  --   --    < >  --  152*  --  149* 144 147* 150*  K 5.7*  --   --    < >  --  5.0  --  4.8 4.5 4.5 4.2  CL 110  --   --   --   --  119*  --  114* 113*  --  112*  CO2 23  --   --   --   --  22  --  24 23  --  27  GLUCOSE 229*  --   --   --   --  235*  --  396* 397*  --  302*  BUN 100*  --   --   --   --  104*  --  124* 136*  --  137*  CREATININE 3.76*  --   --   --   --  2.47*  --  2.43* 2.47*  --  2.39*  CALCIUM 7.6*  --   --   --   --  7.7*  --  7.4* 7.2*  --  8.0*  MG  --    < > 3.1*  --  3.1*  --  2.9*  --  2.9*  --  2.9*  PHOS 7.4*   < > 3.4  --   --  4.3 5.5* 5.0*  --   --  4.0   < > = values in this interval not displayed.    GFR: Estimated Creatinine Clearance: 32.3 mL/min (A) (by C-G formula based on SCr of 2.39 mg/dL (H)). Recent Labs  Lab 07/08/2019 1220 07/14/2019 1357 07/24/2019 1600 07/12/19 0324 07/14/19 0404 07/15/19 0420 07/16/19 0430  PROCALCITON 25.01  --   --   --   --   --   --   WBC 9.5  --   --  5.4 7.9 6.7 9.1  LATICACIDVEN  --  3.8* 3.7*  --   --   --   --     Liver Function Tests: Recent Labs  Lab 07/03/2019 1220  07/12/19 1600 07/13/19 0432 07/14/19 0405 07/15/19 0420 07/16/19 0430  AST 76*  --   --   --   --   --  27  ALT 65*  --   --   --   --   --  32  ALKPHOS 73  --   --   --   --   --  92  BILITOT 0.9  --   --   --   --   --  <0.1*  PROT 8.8*  --   --   --   --   --  5.9*  ALBUMIN 3.2*   < > 2.4* 2.1* 1.8* 1.6* 1.8*   < > = values in this interval not displayed.   No results for input(s): LIPASE, AMYLASE in the last 168 hours. Recent Labs  Lab 07/10/2019 1357  AMMONIA 30    ABG    Component Value Date/Time   PHART 7.341 (L) 07/16/2019 0237   PCO2ART 46.2 07/16/2019 0237   PO2ART 60.0 (L) 07/16/2019 0237   HCO3 25.0 07/16/2019 0237   TCO2 26 07/16/2019 0237   ACIDBASEDEF 1.0 07/16/2019 0237   O2SAT 89.0 07/16/2019 0237     Coagulation Profile: Recent Labs  Lab 07/12/19 0117  INR 1.3*    Cardiac Enzymes: No results for input(s): CKTOTAL, CKMB, CKMBINDEX, TROPONINI in the last 168 hours.  HbA1C: Hgb A1c MFr Bld  Date/Time Value Ref Range Status  07/12/2019 05:00 AM 7.0 (H) 4.8 - 5.6 % Final    Comment:    (NOTE) Pre diabetes:          5.7%-6.4% Diabetes:              >6.4% Glycemic control for   <7.0% adults with diabetes     CBG: Recent Labs  Lab 07/15/19 0824 07/15/19 1200 07/15/19 1611 07/15/19 2009 07/15/19 2347  GLUCAP 391* 361* 328* 374* 401*    CRITICAL CARE Performed by: Cristal Generous   Total critical care time: 35 minutes  Critical care time was exclusive of separately billable procedures and treating other patients.  Critical care was necessary to treat or prevent imminent or life-threatening deterioration.  Critical care was time spent personally by me on the following activities: development of treatment plan with patient and/or surrogate as well as nursing, discussions with consultants, evaluation of patient's response to treatment, examination of patient, obtaining history from patient or surrogate, ordering and performing treatments and interventions, ordering and review of laboratory studies, ordering and review of radiographic studies, pulse oximetry and re-evaluation of patient's condition.  Attending Note:  63 year old male COVID positive who was transferred from [facility] to [facility] for iHD.  Overnight, no significant events.  Remains very sedate on exam with coarse  BS diffusely.  I reviewed CXR myself, ETT is in a good position and mild edema noted.  Discussed with PCCM-NP.  Neurology evaluation is promising.  Will d/c all sedation today and evaluate mental status.  FiO2 demand is supportive of weaning trials but would like to see mental status first.  D?C LTM per neuro recommendations.  D/C pressors.  Imagine will transition to iHD at this point.  Not a GVC candidate since will be on iHD.  Will re-evaluate after a couple of hours to evaluate for weaning possibilities.  D/C insulin drip as well.  The patient is critically ill with multiple organ systems failure and requires high complexity decision making for assessment and support, frequent evaluation and titration of therapies, application of advanced monitoring technologies and extensive interpretation of multiple databases.   Critical Care Time devoted to patient care services described in this note is  32  Minutes. This time reflects time of care of this signee Dr Jennet Maduro. This critical care time does not reflect procedure time, or teaching time or supervisory time of PA/NP/Med student/Med Resident etc but could involve care discussion time.  Rush Farmer, M.D. [facility] Pulmonary/Critical Care Medicine.

## 2019-07-16 NOTE — Progress Notes (Addendum)
Subjective: Biting on ET tube overnight. Fentanyl gtt increased to a rate of 300.   Objective: Current vital signs: BP 132/84   Pulse 92   Temp (!) 97.5 F (36.4 C)   Resp 16   Ht _0  (1.676 m)   Wt 87.4 kg   SpO2 98%   BMI 31.10 kg/m  Vital signs in last 24 hours: Temp:  [96.1 F (35.6 C)-98.6 F (37 C)] 97.5 F (36.4 C) (11/22 0741) Pulse Rate:  [79-114] 92 (11/22 0741) Resp:  [12-19] 16 (11/22 0741) BP: (106-168)/(66-97) 132/84 (11/22 0741) SpO2:  [85 %-100 %] 98 % (11/22 0741) FiO2 (%):  [50 %-60 %] 60 % (11/22 0741) Weight:  [87.4 kg] 87.4 kg (11/22 0449)  Intake/Output from previous day: 11/21 0701 - 11/22 0700 In: 3804.5 [I.V.:1007.5; NG/GT:2440; IV Piggyback:356.9] Out: 2665 [Urine:2665] Intake/Output this shift: No intake/output data recorded. Nutritional status:  Diet Order            Diet NPO time specified  Diet effective now             HEENT: [place]/AT Lungs: Intubated Ext: Warm and well perfused  Neurologic Exam: Ment: Intubated and sedated on fentanyl. Eyes initially closed, open slowly to sternal rub and transitions from sedation/sleep to an awake unresponsive state with eye blinking at normal intervals, eyes at midline. Does not gaze towards or away from visual stimuli. No attempts to communicate. No spontaneous movement. No purposeful movement to noxious stimuli. Does not respond to voice or verbal commands.  CN: Pupils pinpoint on high dose fentanyl. No blink to threat. No doll's eye reflex. Eyes conjugate at the midline. No nystagmus. No blink to eyelash stimulation, but does spontaneously blink. No response to brow-ridge pressure. Face flaccidly symmetric. Cough reflex intact.  Motor/Sensory: Flaccid tone x 4. No movement of BUE to sternal rub or arm pinch (sedated on fentanyl). Triple flexion responses to plantar stimulation bilaterally.  Reflexes: Brisk, low amplitude reflexes brachioradialis and patellae bilaterally. Toes mute. No clonus with  passive dorsiflexion at ankles.    Lab Results: Results for orders placed or performed during the hospital encounter of 06/27/2019 (from the past 48 hour(s))  Glucose, capillary     Status: Abnormal   Collection Time: 07/14/19 12:22 PM  Result Value Ref Range   Glucose-Capillary 208 (H) 70 - 99 mg/dL  Magnesium     Status: Abnormal   Collection Time: 07/14/19  5:00 PM  Result Value Ref Range   Magnesium 2.9 (H) 1.7 - 2.4 mg/dL    Comment: Performed at [facility] [address] [place], [place] [address]  Phosphorus     Status: Abnormal   Collection Time: 07/14/19  5:00 PM  Result Value Ref Range   Phosphorus 5.5 (H) 2.5 - 4.6 mg/dL    Comment: Performed at [facility] [address] [place], [place] [address]  Glucose, capillary     Status: Abnormal   Collection Time: 07/14/19  5:01 PM  Result Value Ref Range   Glucose-Capillary 329 (H) 70 - 99 mg/dL  Glucose, capillary     Status: Abnormal   Collection Time: 07/14/19  8:38 PM  Result Value Ref Range   Glucose-Capillary 308 (H) 70 - 99 mg/dL  Glucose, capillary     Status: Abnormal   Collection Time: 07/15/19 12:26 AM  Result Value Ref Range   Glucose-Capillary 271 (H) 70 - 99 mg/dL  Glucose, capillary     Status: Abnormal  Collection Time: 07/15/19  3:11 AM  Result Value Ref Range   Glucose-Capillary 377 (H) 70 - 99 mg/dL  D-dimer, quantitative (not at [facility])     Status: Abnormal   Collection Time: 07/15/19  4:20 AM  Result Value Ref Range   D-Dimer, Quant 1.55 (H) 0.00 - 0.50 ug/mL-FEU    Comment: (NOTE) At the manufacturer cut-off of 0.50 ug/mL FEU, this assay has been documented to exclude PE with a sensitivity and negative predictive value of 97 to 99%.  At this time, this assay has not been approved by the FDA to exclude DVT/VTE. Results should be correlated with clinical presentation. Performed at [facility], [place] [address] [place], [place] [address]   Ferritin     Status: Abnormal    Collection Time: 07/15/19  4:20 AM  Result Value Ref Range   Ferritin 1,399 (H) 24 - 336 ng/mL    Comment: Performed at [facility], [place] [address] [place], [place] [address]  Lactate dehydrogenase     Status: Abnormal   Collection Time: 07/15/19  4:20 AM  Result Value Ref Range   LDH 364 (H) 98 - 192 U/L    Comment: Performed at [facility], [place] [address] [place], [place] [address]  C-reactive protein     Status: Abnormal   Collection Time: 07/15/19  4:20 AM  Result Value Ref Range   CRP 19.2 (H) <1.0 mg/dL    Comment: Performed at [facility] [address], [place], [place] [address]  CBC with Differential/Platelet     Status: Abnormal   Collection Time: 07/15/19  4:20 AM  Result Value Ref Range   WBC 6.7 4.0 - 10.5 K/uL   RBC 4.93 4.22 - 5.81 MIL/uL   Hemoglobin 11.6 (L) 13.0 - 17.0 g/dL   HCT 37.5 (L) 39.0 - 52.0 %   MCV 76.1 (L) 80.0 - 100.0 fL   MCH 23.5 (L) 26.0 - 34.0 pg   MCHC 30.9 30.0 - 36.0 g/dL   RDW 17.2 (H) 11.5 - 15.5 %   Platelets 294 150 - 400 K/uL   nRBC 0.0 0.0 - 0.2 %   Neutrophils Relative % 91 %   Neutro Abs 6.1 1.7 - 7.7 K/uL   Lymphocytes Relative 2 %   Lymphs Abs 0.2 (L) 0.7 - 4.0 K/uL   Monocytes Relative 5 %   Monocytes Absolute 0.3 0.1 - 1.0 K/uL   Eosinophils Relative 0 %   Eosinophils Absolute 0.0 0.0 - 0.5 K/uL   Basophils Relative 0 %   Basophils Absolute 0.0 0.0 - 0.1 K/uL   Immature Granulocytes 2 %   Abs Immature Granulocytes 0.16 (H) 0.00 - 0.07 K/uL    Comment: Performed at [facility] [address], [place],  [address]  Renal function panel     Status: Abnormal   Collection Time: 07/15/19  4:20 AM  Result Value Ref Range   Sodium 149 (H) 135 - 145 mmol/L   Potassium 4.8 3.5 - 5.1 mmol/L   Chloride 114 (H) 98 - 111 mmol/L   CO2 24 22 - 32 mmol/L   Glucose, Bld 396 (H) 70 - 99 mg/dL   BUN 124 (H) 8 - 23 mg/dL   Creatinine, Ser 2.43 (H) 0.61 - 1.24 mg/dL   Calcium 7.4 (L) 8.9 - 10.3 mg/dL    Phosphorus 5.0 (H) 2.5 - 4.6 mg/dL   Albumin 1.6 (L) 3.5 - 5.0 g/dL  GFR calc non Af Amer 27 (L) >60 mL/min   GFR calc Af Amer 31 (L) >60 mL/min   Anion gap 11 5 - 15    Comment: Performed at [facility] [address] [place], [place] [address]  Glucose, capillary     Status: Abnormal   Collection Time: 07/15/19  8:24 AM  Result Value Ref Range   Glucose-Capillary 391 (H) 70 - 99 mg/dL  Glucose, capillary     Status: Abnormal   Collection Time: 07/15/19 12:00 PM  Result Value Ref Range   Glucose-Capillary 361 (H) 70 - 99 mg/dL  Glucose, capillary     Status: Abnormal   Collection Time: 07/15/19  4:11 PM  Result Value Ref Range   Glucose-Capillary 328 (H) 70 - 99 mg/dL  Glucose, capillary     Status: Abnormal   Collection Time: 07/15/19  8:09 PM  Result Value Ref Range   Glucose-Capillary 374 (H) 70 - 99 mg/dL  Magnesium     Status: Abnormal   Collection Time: 07/15/19  8:20 PM  Result Value Ref Range   Magnesium 2.9 (H) 1.7 - 2.4 mg/dL    Comment: Performed at [facility], [place] [address] [place], [place] [address]  Basic metabolic panel     Status: Abnormal   Collection Time: 07/15/19  8:20 PM  Result Value Ref Range   Sodium 144 135 - 145 mmol/L   Potassium 4.5 3.5 - 5.1 mmol/L   Chloride 113 (H) 98 - 111 mmol/L   CO2 23 22 - 32 mmol/L   Glucose, Bld 397 (H) 70 - 99 mg/dL   BUN 136 (H) 8 - 23 mg/dL   Creatinine, Ser 2.47 (H) 0.61 - 1.24 mg/dL   Calcium 7.2 (L) 8.9 - 10.3 mg/dL   GFR calc non Af Amer 27 (L) >60 mL/min   GFR calc Af Amer 31 (L) >60 mL/min   Anion gap 8 5 - 15    Comment: Performed at [facility] [address] [place], [place] [address]  Glucose, capillary     Status: Abnormal   Collection Time: 07/15/19 11:47 PM  Result Value Ref Range   Glucose-Capillary 401 (H) 70 - 99 mg/dL   Comment 1 Notify RN   POCT Activated clotting time     Status: None   Collection Time: 07/16/19  2:30 AM  Result Value Ref Range   Activated  Clotting Time 0 seconds  I-STAT 7, (LYTES, BLD GAS, ICA, H+H)     Status: Abnormal   Collection Time: 07/16/19  2:37 AM  Result Value Ref Range   pH, Arterial 7.341 (L) 7.350 - 7.450   pCO2 arterial 46.2 32.0 - 48.0 mmHg   pO2, Arterial 60.0 (L) 83.0 - 108.0 mmHg   Bicarbonate 25.0 20.0 - 28.0 mmol/L   TCO2 26 22 - 32 mmol/L   O2 Saturation 89.0 %   Acid-base deficit 1.0 0.0 - 2.0 mmol/L   Sodium 147 (H) 135 - 145 mmol/L   Potassium 4.5 3.5 - 5.1 mmol/L   Calcium, Ion 1.15 1.15 - 1.40 mmol/L   HCT 34.0 (L) 39.0 - 52.0 %   Hemoglobin 11.6 (L) 13.0 - 17.0 g/dL   Patient temperature 36.8 C    Collection site ARTERIAL LINE    Drawn by RT    Sample type ARTERIAL   D-dimer, quantitative (not at [facility])     Status: Abnormal   Collection Time: 07/16/19  4:30 AM  Result Value Ref Range   D-Dimer, Quant 1.42 (H) 0.00 - 0.50 ug/mL-FEU    Comment: (NOTE) At the manufacturer cut-off of 0.50 ug/mL FEU, this assay has been documented to exclude PE with a sensitivity and negative predictive value of 97 to 99%.  At this time, this assay has not been approved by the FDA to exclude DVT/VTE. Results should be correlated with clinical presentation. Performed at [facility], [place] [address], [place], [place] [address]   Ferritin     Status: Abnormal   Collection Time: 07/16/19  4:30 AM  Result Value Ref Range   Ferritin 1,338 (H) 24 - 336 ng/mL    Comment: Performed at [facility], [place] [address], [place], [place] [address]  Lactate dehydrogenase     Status: Abnormal   Collection Time: 07/16/19  4:30 AM  Result Value Ref Range   LDH 373 (H) 98 - 192 U/L    Comment: Performed at [facility] [address], [place], [place] [address]  C-reactive protein     Status: Abnormal   Collection Time: 07/16/19  4:30 AM  Result Value Ref Range   CRP 13.1 (H) <1.0 mg/dL    Comment: Performed at [facility] [address], [place], [place] [address]  CBC with  Differential/Platelet     Status: Abnormal   Collection Time: 07/16/19  4:30 AM  Result Value Ref Range   WBC 9.1 4.0 - 10.5 K/uL   RBC 5.04 4.22 - 5.81 MIL/uL   Hemoglobin 11.9 (L) 13.0 - 17.0 g/dL   HCT 38.0 (L) 39.0 - 52.0 %   MCV 75.4 (L) 80.0 - 100.0 fL   MCH 23.6 (L) 26.0 - 34.0 pg   MCHC 31.3 30.0 - 36.0 g/dL   RDW 17.1 (H) 11.5 - 15.5 %   Platelets 349 150 - 400 K/uL   nRBC 0.0 0.0 - 0.2 %   Neutrophils Relative % 87 %   Neutro Abs 7.8 (H) 1.7 - 7.7 K/uL   Lymphocytes Relative 2 %   Lymphs Abs 0.2 (L) 0.7 - 4.0 K/uL   Monocytes Relative 5 %   Monocytes Absolute 0.4 0.1 - 1.0 K/uL   Eosinophils Relative 0 %   Eosinophils Absolute 0.0 0.0 - 0.5 K/uL   Basophils Relative 0 %   Basophils Absolute 0.0 0.0 - 0.1 K/uL   RBC Morphology MORPHOLOGY UNREMARKABLE    Immature Granulocytes 6 %   Abs Immature Granulocytes 0.56 (H) 0.00 - 0.07 K/uL    Comment: Performed at [facility], [place] [address], [place],  [address]  Comprehensive metabolic panel     Status: Abnormal   Collection Time: 07/16/19  4:30 AM  Result Value Ref Range   Sodium 150 (H) 135 - 145 mmol/L   Potassium 4.2 3.5 - 5.1 mmol/L   Chloride 112 (H) 98 - 111 mmol/L   CO2 27 22 - 32 mmol/L   Glucose, Bld 302 (H) 70 - 99 mg/dL   BUN 137 (H) 8 - 23 mg/dL   Creatinine, Ser 2.39 (H) 0.61 - 1.24 mg/dL   Calcium 8.0 (L) 8.9 - 10.3 mg/dL   Total Protein 5.9 (L) 6.5 - 8.1 g/dL   Albumin 1.8 (L) 3.5 - 5.0 g/dL   AST 27 15 - 41 U/L   ALT 32 0 - 44 U/L   Alkaline Phosphatase 92 38 - 126 U/L   Total Bilirubin <0.1 (L) 0.3 - 1.2  mg/dL    Comment: REPEATED TO VERIFY   GFR calc non Af Amer 28 (L) >60 mL/min   GFR calc Af Amer 32 (L) >60 mL/min   Anion gap 11 5 - 15    Comment: Performed at [facility] [address], [place], [place] [address]  MAG AM     Status: Abnormal   Collection Time: 07/16/19  4:30 AM  Result Value Ref Range   Magnesium 2.9 (H) 1.7 - 2.4 mg/dL    Comment: Performed at [facility] [address] [place], [place] [address]  Phosphorus AM     Status: None   Collection Time: 07/16/19  4:30 AM  Result Value Ref Range   Phosphorus 4.0 2.5 - 4.6 mg/dL    Comment: Performed at [facility] [address] [place], [place] [address]    Recent Results (from the past 240 hour(s))  Urine culture     Status: None   Collection Time: 07/16/2019  1:50 AM   Specimen: Urine, Random  Result Value Ref Range Status   Specimen Description URINE, RANDOM  Final   Special Requests NONE  Final   Culture   Final    NO GROWTH Performed at [facility], [place] [address], [place], [place] [address]    Report Status 07/12/2019 FINAL  Final  Blood Culture (routine x 2)     Status: None (Preliminary result)   Collection Time: 07/05/2019  1:57 PM   Specimen: BLOOD RIGHT WRIST  Result Value Ref Range Status   Specimen Description BLOOD RIGHT WRIST  Final   Special Requests   Final    BOTTLES DRAWN AEROBIC ONLY Blood Culture results may not be optimal due to an inadequate volume of blood received in culture bottles   Culture   Final    NO GROWTH 4 DAYS Performed at [facility], [address], [place], [place] [address]    Report Status PENDING  Incomplete  Blood Culture (routine x 2)     Status: None (Preliminary result)   Collection Time: 07/09/2019  1:57 PM   Specimen: BLOOD RIGHT ARM  Result Value Ref Range Status   Specimen Description BLOOD RIGHT ARM  Final   Special Requests   Final    BOTTLES DRAWN AEROBIC AND ANAEROBIC Blood Culture adequate volume   Culture   Final    NO GROWTH 4 DAYS Performed at [facility], [address] [place], [place] [address]    Report Status PENDING  Incomplete  SARS Coronavirus 2 by RT PCR (hospital order, performed in [place] hospital lab) Nasopharyngeal Nasopharyngeal Swab     Status: Abnormal   Collection Time: 07/04/2019  3:20 PM   Specimen: Nasopharyngeal Swab  Result Value Ref Range Status   SARS Coronavirus 2  POSITIVE (A) NEGATIVE Final    Comment: RESULT CALLED TO, READ BACK BY AND VERIFIED WITH: WHITE,M AT 1806 ON 11.17.20 BY ISLEY,B (NOTE) If result is NEGATIVE SARS-CoV-2 target nucleic acids are NOT DETECTED. The SARS-CoV-2 RNA is generally detectable in upper and lower  respiratory specimens during the acute phase of infection. The lowest  concentration of SARS-CoV-2 viral copies this assay can detect is 250  copies / mL. A negative result does not preclude SARS-CoV-2 infection  and should not be used as the sole basis for treatment or other  patient management decisions.  A negative result may occur with  improper specimen collection / handling, submission of  specimen other  than nasopharyngeal swab, presence of viral mutation(s) within the  areas targeted by this assay, and inadequate number of viral copies  (<250 copies / mL). A negative result must be combined with clinical  observations, patient history, and epidemiological information. If result is POSITIVE SARS-CoV-2 target nucleic acids are DETECTED.  The SARS-CoV-2 RNA is generally detectable in upper and lower  respiratory specimens during the acute phase of infection.  Positive  results are indicative of active infection with SARS-CoV-2.  Clinical  correlation with patient history and other diagnostic information is  necessary to determine patient infection status.  Positive results do  not rule out bacterial infection or co-infection with other viruses. If result is PRESUMPTIVE POSTIVE SARS-CoV-2 nucleic acids MAY BE PRESENT.   A presumptive positive result was obtained on the submitted specimen  and confirmed on repeat testing.  While 2019 novel coronavirus  (SARS-CoV-2) nucleic acids may be present in the submitted sample  additional confirmatory testing may be necessary for epidemiological  and / or clinical management purposes  to differentiate between  SARS-CoV-2 and other Sarbecovirus currently known to infect  humans.  If clinically indicated additional testing with an alternate test  methodology [phone])  is advised. The SARS-CoV-2 RNA is generally  detectable in upper and lower respiratory specimens during the acute  phase of infection. The expected result is Negative. Fact Sheet for Patients:  StrictlyIdeas.no Fact Sheet for Healthcare Providers: BankingDealers.co.za This test is not yet approved or cleared by the [place] FDA and has been authorized for detection and/or diagnosis of SARS-CoV-2 by FDA under an Emergency Use Authorization (EUA).  This EUA will remain in effect (meaning this test can be used) for the duration of the COVID-19 declaration under Section 564(b)(1) of the Act, 21 U.S.C. section 360bbb-3(b)(1), unless the authorization is terminated or revoked sooner. Performed at [facility], [address] [place], [place] [address]   MRSA PCR Screening     Status: None   Collection Time: 06/26/2019 10:39 PM   Specimen: Nasopharyngeal  Result Value Ref Range Status   MRSA by PCR NEGATIVE NEGATIVE Final    Comment:        The GeneXpert MRSA Assay (FDA approved for NASAL specimens only), is one component of a comprehensive MRSA colonization surveillance program. It is not intended to diagnose MRSA infection nor to guide or monitor treatment for MRSA infections. Performed at [facility], [place] [address], [place], [place] [address]   Culture, respiratory (tracheal aspirate)     Status: None   Collection Time: 07/12/19  8:33 AM   Specimen: Tracheal Aspirate; Respiratory  Result Value Ref Range Status   Specimen Description TRACHEAL ASPIRATE  Final   Special Requests NONE  Final   Gram Stain   Final    MODERATE WBC PRESENT, PREDOMINANTLY PMN MODERATE GRAM POSITIVE COCCI IN PAIRS    Culture   Final    ABUNDANT MORAXELLA CATARRHALIS(BRANHAMELLA) BETA LACTAMASE POSITIVE Performed at [facility], [address] [place], [place] [address]    Report Status 07/14/2019 FINAL  Final  Blood culture (routine x 2)     Status: None (Preliminary result)   Collection Time: 07/13/19  3:07 PM   Specimen: BLOOD LEFT HAND  Result Value Ref Range Status   Specimen Description BLOOD LEFT HAND  Final   Special Requests   Final    BOTTLES DRAWN AEROBIC ONLY Blood Culture results may not be optimal due to an inadequate  volume of blood received in culture bottles   Culture   Final    NO GROWTH 2 DAYS Performed at [facility], [place] [address], [place], [place] [address]    Report Status PENDING  Incomplete  Blood culture (routine x 2)     Status: None (Preliminary result)   Collection Time: 07/13/19  3:11 PM   Specimen: BLOOD RIGHT HAND  Result Value Ref Range Status   Specimen Description BLOOD RIGHT HAND  Final   Special Requests   Final    BOTTLES DRAWN AEROBIC ONLY Blood Culture results may not be optimal due to an inadequate volume of blood received in culture bottles   Culture   Final    NO GROWTH 2 DAYS Performed at [facility], [place] [address], [place], [place] [address]    Report Status PENDING  Incomplete    Lipid Panel No results for input(s): CHOL, TRIG, HDL, CHOLHDL, VLDL, LDLCALC in the last 72 hours.  Studies/Results: Mr Brain Wo Contrast  Result Date: 07/15/2019 CLINICAL DATA:  Altered mental status. COVID-19 positive. Acute renal failure. Encephalopathy. EXAM: MRI HEAD WITHOUT CONTRAST TECHNIQUE: Multiplanar, multiecho pulse sequences of the brain and surrounding structures were obtained without intravenous contrast. COMPARISON:  CT head 07/13/2019 FINDINGS: Brain: Negative for acute infarct. Scattered white matter hyperintensities most compatible with chronic ischemia. Negative for hemorrhage or mass. Ventricle size normal. No midline shift. Vascular: Normal arterial flow voids Skull and upper cervical spine: Negative Sinuses/Orbits: Patient is intubated. Sinus mucosal  disease. Normal orbit Other: None IMPRESSION: No acute abnormality Mild chronic microvascular ischemic change in the white matter. Electronically Signed   By: Franchot Gallo M.D.   On: 07/15/2019 15:55   Dg Chest Port 1 View  Result Date: 07/16/2019 CLINICAL DATA:  COVID-19 pneumonia. EXAM: PORTABLE CHEST 1 VIEW COMPARISON:  07/15/2019 FINDINGS: 0525 hours. Endotracheal tube tip is approximately 3.5 cm above the base of the carina. Low lung volumes with cardiomegaly. Bilateral patchy airspace disease again noted and is progressive at the left base. NG tube tip is looped in the stomach. Right IJ central line tip overlies the proximal to mid SVC. IMPRESSION: Low volumes with persistent patchy bilateral airspace disease with some progression of disease at the left base. Electronically Signed   By: Misty Stanley M.D.   On: 07/16/2019 07:20   Dg Chest Port 1 View  Result Date: 07/15/2019 CLINICAL DATA:  COVID-19 positive. Shortness of breath. On ventilator. EXAM: PORTABLE CHEST 1 VIEW COMPARISON:  One day prior FINDINGS: Right IJ line unchanged with tip at mid to low SVC. Endotracheal tube terminates 6.6 cm above carina. Nasogastric tube looped in stomach with tip at gastric body. Normal heart size. No pleural effusion or pneumothorax. Minimally worsened aeration, with relatively diffuse right-sided and lower lung predominant left-sided airspace disease. IMPRESSION: Worsened right greater than left relatively diffuse pneumonia. Stable support apparatus, with the endotracheal tube borderline high, on the order of 6.6 cm above the carina. Electronically Signed   By: Abigail Miyamoto M.D.   On: 07/15/2019 10:59   Dg Chest Port 1 View  Result Date: 07/14/2019 CLINICAL DATA:  Intubation EXAM: PORTABLE CHEST 1 VIEW COMPARISON:  Portable exam 1055 hours compared to 07/14/2019 at 0516 hours FINDINGS: Tip of endotracheal tube projects 8.5 cm above carina. Nasogastric tube coiled in proximal stomach with tip at fundus.  RIGHT jugular central venous catheter with tip projecting over SVC. Normal heart size and mediastinal contours. BILATERAL pulmonary infiltrates, minimally improved.  Elevation of LEFT diaphragm with minimal LEFT basilar atelectasis. No pneumothorax. IMPRESSION: Slightly improved pulmonary infiltrates. Mild LEFT basilar atelectasis. Electronically Signed   By: Lavonia Dana M.D.   On: 07/14/2019 11:16   Vas [place] Lower Extremity Venous (dvt) (only [facility])  Result Date: 07/14/2019  Lower Venous Study Indications: Swelling, and Covid positive.  Limitations: Bandages. Comparison Study: No priors. Performing Technologist: Oda Cogan RDMS, RVT  Examination Guidelines: A complete evaluation includes B-mode imaging, spectral Doppler, color Doppler, and power Doppler as needed of all accessible portions of each vessel. Bilateral testing is considered an integral part of a complete examination. Limited examinations for reoccurring indications may be performed as noted.  +---------+---------------+---------+-----------+----------+-------------------+ RIGHT    CompressibilityPhasicitySpontaneityPropertiesThrombus Aging      +---------+---------------+---------+-----------+----------+-------------------+ CFV                                                   bandages in place -                                                       not imaged          +---------+---------------+---------+-----------+----------+-------------------+ FV Prox  Full                                                             +---------+---------------+---------+-----------+----------+-------------------+ FV Mid   Full                                                             +---------+---------------+---------+-----------+----------+-------------------+ FV DistalFull                                                              +---------+---------------+---------+-----------+----------+-------------------+ PFV      Full                                                             +---------+---------------+---------+-----------+----------+-------------------+ POP      Full           Yes      Yes                                      +---------+---------------+---------+-----------+----------+-------------------+ PTV      Full                                                             +---------+---------------+---------+-----------+----------+-------------------+  PERO     Full                                                             +---------+---------------+---------+-----------+----------+-------------------+   +---------+---------------+---------+-----------+----------+--------------+ LEFT     CompressibilityPhasicitySpontaneityPropertiesThrombus Aging +---------+---------------+---------+-----------+----------+--------------+ CFV      Full           Yes      Yes                                 +---------+---------------+---------+-----------+----------+--------------+ SFJ      Full                                                        +---------+---------------+---------+-----------+----------+--------------+ FV Prox  Full                                                        +---------+---------------+---------+-----------+----------+--------------+ FV Mid   Full                                                        +---------+---------------+---------+-----------+----------+--------------+ FV DistalFull                                                        +---------+---------------+---------+-----------+----------+--------------+ PFV      Full                                                        +---------+---------------+---------+-----------+----------+--------------+ POP      Full           Yes      Yes                                  +---------+---------------+---------+-----------+----------+--------------+ PTV      Full                                                        +---------+---------------+---------+-----------+----------+--------------+ PERO     Full                                                        +---------+---------------+---------+-----------+----------+--------------+  Summary: Right: There is no evidence of deep vein thrombosis in the lower extremity. No cystic structure found in the popliteal fossa. Left: There is no evidence of deep vein thrombosis in the lower extremity. No cystic structure found in the popliteal fossa.  *See table(s) above for measurements and observations. Electronically signed by Servando Snare MD on 07/14/2019 at 5:44:16 PM.    Final     Medications:  Scheduled: . chlorhexidine gluconate (MEDLINE KIT)  15 mL Mouth Rinse BID  . Chlorhexidine Gluconate Cloth  6 each Topical Daily  . dexamethasone (DECADRON) injection  6 mg Intravenous Daily  . docusate  100 mg Per Tube BID  . free water  300 mL Per Tube Q4H  . heparin  7,500 Units Subcutaneous Q8H  . mouth rinse  15 mL Mouth Rinse 10 times per day  . pantoprazole sodium  40 mg Per Tube Daily   Continuous: . sodium chloride 75 mL/hr at 07/16/19 0600  . dextrose 5 % and 0.45% NaCl    . feeding supplement (VITAL AF 1.2 CAL) 1,000 mL (07/16/19 0612)  . fentaNYL infusion INTRAVENOUS 300 mcg/hr (07/16/19 0709)  . insulin 6.5 Units/hr (07/16/19 5038)    Assessment: 63 year old male who was admitted with acute hypoxic respiratory failure secondary to COVID-19 PNA, requiring intubation.  He was noted to have jerking/tremor-like movements concerning for seizures.  Therefore neurology was consulted.  EEG overnight showed slowing consistent with a profound diffuse encephalopathy, no seizures or epileptiform discharges were seen. -- Encephalopathy with questionable seizure activity on 11/18 - EEG showed no seizures -- LTM  EEG continued to not record any seizures yesterday.  It is possible that the myoclonic episodes were stimulation induced myoclonus. Now thought less likely to have been focal motor seizures. -- MRI brain obtained yesterday (11/21) showed no acute abnormality. Mild chronic microvascular ischemic changes were seen within the white matter. -- LTM was re-started after MRI. EEG overnight per Dr. Hortense Ramal (through AM 11/22) shows no electrographic seizure activity, but slowing is noted.  -- ICU conditions: COVID-19 infection and septic shock vs component of cardiogenic shock in setting of acute systolic heart failure. Also with AKI, now off CRRT. Has been hyperglycemic.   Recommendations: -Discontinuing LTM -We will defer starting antiepileptic drugs unless there is high suspicion for seizures or EEG shows interictal/ictal activity. -Continue management of COVID-19 per primary team. On Remdesivir x 5 days and Dexamethasone x 10 days, as well as SQ heparin for anticoagulation -Continue to correct metabolic derangements, per primary team -Attempt to wean sedation per ICU team -As needed IV Ativan 2 mg for generalized tonic-clonic seizure lasting more than 2 minutes or focal seizure lasting more than 5 minutes.  35 minutes spent in the neurological evaluation and management of this critically ill patient.    LOS: 5 days   _0  signed: Dr. Kerney Elbe 07/16/2019  8:29 AM

## 2019-07-16 NOTE — Progress Notes (Signed)
[facility] Progress Note Patient Name: Craig Mccoy DOB: 08/17/1956 MRN: [id number]   Date of Service  07/16/2019  HPI/Events of Note  Hyperglycemia - Blood glucose = 374 --> 401 in spite of increase from AC/HS sensitive to Q 4 moderate Novolog SSI.  eICU Interventions  Will order: 1. Insulin IV infusion per glucose stabilizer protocol.      Intervention Category Major Interventions: Hyperglycemia - active titration of insulin therapy  [name] 07/16/2019, 12:10 AM

## 2019-07-16 NOTE — Progress Notes (Signed)
LTM EEG discontinued - no skin breakdown at unhook.   

## 2019-07-16 NOTE — Procedures (Addendum)
Patient Name:Craig Mccoy [id number] Epilepsy Attending:Gareth Fitzner Barbra Sarks Referring Physician/Provider:Dr Trevor Mace Duration:07/15/2019 1614 to 07/16/2019 1614  Patient history:63 year old male with COVID-19 and altered mental status, seizure-like episodes. EEG to evaluate for seizures.  Level of alertness:Sedated/comatose  AEDs during EEG study:None  Technical aspects: This EEG study was done with scalp electrodes positioned according to the 10-20 International system of electrode placement. Electrical activity was acquired at a sampling rate of 500Hz  and reviewed with a high frequency filter of 70Hz  and a low frequency filter of 1Hz . EEG data were recorded continuously and digitally stored.  Description: EEG showed continuous generalized 2 to 5 Hz theta-delta slowing. EEG was reactive to tactile stimuli. Hyperventilation and photic stimulation were not performed.  Abnormality -Continuous slow, generalized   IMPRESSION: This study issuggestive of profound diffuse encephalopathy, nonspecific to etiology.No seizures or epileptiform discharges were seen throughout the recording.  EEG appears similar to prior study.

## 2019-07-16 NOTE — Progress Notes (Signed)
Updates provided to wife, Caryl Pina. All questions answered.    Eliseo Gum MSN, AGACNP-BC [place] [phone] If no answer, [phone] 07/16/2019, 5:41 PM

## 2019-07-16 NOTE — Progress Notes (Signed)
LTM maint complete - no skin breakdown under:  Cz, P4, and Fp2

## 2019-07-17 ENCOUNTER — Inpatient Hospital Stay (HOSPITAL_COMMUNITY): Payer: BC Managed Care – PPO

## 2019-07-17 DIAGNOSIS — J80 Acute respiratory distress syndrome: Secondary | ICD-10-CM | POA: Diagnosis not present

## 2019-07-17 DIAGNOSIS — U071 COVID-19: Secondary | ICD-10-CM | POA: Diagnosis not present

## 2019-07-17 LAB — RENAL FUNCTION PANEL
Albumin: 1.6 g/dL — ABNORMAL LOW (ref 3.5–5.0)
Anion gap: 6 (ref 5–15)
BUN: 132 mg/dL — ABNORMAL HIGH (ref 8–23)
CO2: 28 mmol/L (ref 22–32)
Calcium: 7.6 mg/dL — ABNORMAL LOW (ref 8.9–10.3)
Chloride: 113 mmol/L — ABNORMAL HIGH (ref 98–111)
Creatinine, Ser: 2.15 mg/dL — ABNORMAL HIGH (ref 0.61–1.24)
GFR calc Af Amer: 36 mL/min — ABNORMAL LOW (ref >60)
GFR calc non Af Amer: 31 mL/min — ABNORMAL LOW (ref >60)
Glucose, Bld: 316 mg/dL — ABNORMAL HIGH (ref 70–99)
Phosphorus: 4.1 mg/dL (ref 2.5–4.6)
Potassium: 4.1 mmol/L (ref 3.5–5.1)
Sodium: 147 mmol/L — ABNORMAL HIGH (ref 135–145)

## 2019-07-17 LAB — CULTURE, BLOOD (ROUTINE X 2)
Culture: NO GROWTH
Culture: NO GROWTH
Special Requests: ADEQUATE

## 2019-07-17 LAB — POCT I-STAT 7, (LYTES, BLD GAS, ICA,H+H)
Acid-base deficit: 1 mmol/L (ref 0.0–2.0)
Bicarbonate: 27.9 mmol/L (ref 20.0–28.0)
Calcium, Ion: 1.11 mmol/L — ABNORMAL LOW (ref 1.15–1.40)
HCT: 34 % — ABNORMAL LOW (ref 39.0–52.0)
Hemoglobin: 11.6 g/dL — ABNORMAL LOW (ref 13.0–17.0)
O2 Saturation: 92 %
Patient temperature: 99.4
Potassium: 5.1 mmol/L (ref 3.5–5.1)
Sodium: 145 mmol/L (ref 135–145)
TCO2: 30 mmol/L (ref 22–32)
pCO2 arterial: 66.8 mmHg (ref 32.0–48.0)
pH, Arterial: 7.232 — ABNORMAL LOW (ref 7.350–7.450)
pO2, Arterial: 78 mmHg — ABNORMAL LOW (ref 83.0–108.0)

## 2019-07-17 LAB — GLUCOSE, CAPILLARY
Glucose-Capillary: 191 mg/dL — ABNORMAL HIGH (ref 70–99)
Glucose-Capillary: 220 mg/dL — ABNORMAL HIGH (ref 70–99)
Glucose-Capillary: 224 mg/dL — ABNORMAL HIGH (ref 70–99)
Glucose-Capillary: 241 mg/dL — ABNORMAL HIGH (ref 70–99)
Glucose-Capillary: 250 mg/dL — ABNORMAL HIGH (ref 70–99)
Glucose-Capillary: 268 mg/dL — ABNORMAL HIGH (ref 70–99)
Glucose-Capillary: 277 mg/dL — ABNORMAL HIGH (ref 70–99)
Glucose-Capillary: 282 mg/dL — ABNORMAL HIGH (ref 70–99)
Glucose-Capillary: 307 mg/dL — ABNORMAL HIGH (ref 70–99)

## 2019-07-17 LAB — LACTATE DEHYDROGENASE: LDH: 370 U/L — ABNORMAL HIGH (ref 98–192)

## 2019-07-17 LAB — CBC WITH DIFFERENTIAL/PLATELET
Abs Immature Granulocytes: 0.41 10*3/uL — ABNORMAL HIGH (ref 0.00–0.07)
Basophils Absolute: 0 10*3/uL (ref 0.0–0.1)
Basophils Relative: 0 %
Eosinophils Absolute: 0 10*3/uL (ref 0.0–0.5)
Eosinophils Relative: 0 %
HCT: 32.9 % — ABNORMAL LOW (ref 39.0–52.0)
Hemoglobin: 10.3 g/dL — ABNORMAL LOW (ref 13.0–17.0)
Immature Granulocytes: 6 %
Lymphocytes Relative: 3 %
Lymphs Abs: 0.2 10*3/uL — ABNORMAL LOW (ref 0.7–4.0)
MCH: 23.4 pg — ABNORMAL LOW (ref 26.0–34.0)
MCHC: 31.3 g/dL (ref 30.0–36.0)
MCV: 74.8 fL — ABNORMAL LOW (ref 80.0–100.0)
Monocytes Absolute: 0.3 10*3/uL (ref 0.1–1.0)
Monocytes Relative: 4 %
Neutro Abs: 6.4 10*3/uL (ref 1.7–7.7)
Neutrophils Relative %: 87 %
Platelets: 281 10*3/uL (ref 150–400)
RBC: 4.4 MIL/uL (ref 4.22–5.81)
RDW: 16.5 % — ABNORMAL HIGH (ref 11.5–15.5)
WBC: 7.3 10*3/uL (ref 4.0–10.5)
nRBC: 0 % (ref 0.0–0.2)

## 2019-07-17 LAB — FERRITIN: Ferritin: 890 ng/mL — ABNORMAL HIGH (ref 24–336)

## 2019-07-17 LAB — C-REACTIVE PROTEIN: CRP: 9.8 mg/dL — ABNORMAL HIGH (ref <1.0)

## 2019-07-17 LAB — D-DIMER, QUANTITATIVE: D-Dimer, Quant: 1.63 ug/mL-FEU — ABNORMAL HIGH (ref 0.00–0.50)

## 2019-07-17 MED ORDER — FENTANYL CITRATE (PF) 100 MCG/2ML IJ SOLN
50.0000 ug | Freq: Once | INTRAMUSCULAR | Status: AC
  Administered 2019-07-17: 50 ug via INTRAVENOUS

## 2019-07-17 MED ORDER — SODIUM CHLORIDE 0.9 % IV SOLN
0.0000 ug/kg/min | INTRAVENOUS | Status: DC
  Administered 2019-07-17 – 2019-07-21 (×7): 3 ug/kg/min via INTRAVENOUS
  Filled 2019-07-17 (×9): qty 20

## 2019-07-17 MED ORDER — VITAMIN C 500 MG/5ML PO SYRP
250.0000 mg | ORAL_SOLUTION | Freq: Every day | ORAL | Status: DC
  Administered 2019-07-17 – 2019-07-28 (×12): 250 mg
  Filled 2019-07-17 (×12): qty 2.5

## 2019-07-17 MED ORDER — DOCUSATE SODIUM 50 MG/5ML PO LIQD
100.0000 mg | Freq: Two times a day (BID) | ORAL | Status: DC | PRN
  Administered 2019-07-20 – 2019-07-22 (×2): 100 mg
  Filled 2019-07-17 (×2): qty 10

## 2019-07-17 MED ORDER — PRO-STAT SUGAR FREE PO LIQD
60.0000 mL | Freq: Three times a day (TID) | ORAL | Status: DC
  Administered 2019-07-17 – 2019-07-28 (×33): 60 mL
  Filled 2019-07-17 (×32): qty 60

## 2019-07-17 MED ORDER — PROPOFOL 1000 MG/100ML IV EMUL
INTRAVENOUS | Status: AC
  Filled 2019-07-17: qty 100

## 2019-07-17 MED ORDER — FENTANYL BOLUS VIA INFUSION
50.0000 ug | INTRAVENOUS | Status: DC | PRN
  Administered 2019-07-17: 50 ug via INTRAVENOUS
  Filled 2019-07-17: qty 50

## 2019-07-17 MED ORDER — FAMOTIDINE 40 MG/5ML PO SUSR
20.0000 mg | ORAL | Status: DC
  Administered 2019-07-17 – 2019-07-27 (×11): 20 mg
  Filled 2019-07-17 (×11): qty 2.5

## 2019-07-17 MED ORDER — PROPOFOL 1000 MG/100ML IV EMUL
0.0000 ug/kg/min | INTRAVENOUS | Status: DC
  Administered 2019-07-17: 40 ug/kg/min via INTRAVENOUS
  Administered 2019-07-17 (×2): 50 ug/kg/min via INTRAVENOUS
  Filled 2019-07-17 (×2): qty 100

## 2019-07-17 MED ORDER — CISATRACURIUM BOLUS VIA INFUSION
0.0500 mg/kg | Freq: Once | INTRAVENOUS | Status: AC
  Administered 2019-07-17: 16:00:00 4.4 mg via INTRAVENOUS
  Filled 2019-07-17: qty 5

## 2019-07-17 MED ORDER — MIDAZOLAM HCL 2 MG/2ML IJ SOLN: 2.0000 mg | INTRAMUSCULAR | Status: DC | PRN

## 2019-07-17 MED ORDER — MIDAZOLAM HCL 2 MG/2ML IJ SOLN
2.0000 mg | INTRAMUSCULAR | Status: DC | PRN
  Administered 2019-07-17 (×3): 2 mg via INTRAVENOUS
  Filled 2019-07-17 (×3): qty 2

## 2019-07-17 MED ORDER — INSULIN DETEMIR 100 UNIT/ML ~~LOC~~ SOLN
15.0000 [IU] | Freq: Two times a day (BID) | SUBCUTANEOUS | Status: DC
  Administered 2019-07-17 (×2): 15 [IU] via SUBCUTANEOUS
  Filled 2019-07-17 (×4): qty 0.15

## 2019-07-17 MED ORDER — ASPIRIN 81 MG PO CHEW
81.0000 mg | CHEWABLE_TABLET | Freq: Every day | ORAL | Status: DC
  Administered 2019-07-17 – 2019-07-28 (×12): 81 mg
  Filled 2019-07-17 (×12): qty 1

## 2019-07-17 MED ORDER — PROPOFOL 1000 MG/100ML IV EMUL
25.0000 ug/kg/min | INTRAVENOUS | Status: DC
  Administered 2019-07-17 – 2019-07-18 (×3): 50 ug/kg/min via INTRAVENOUS
  Administered 2019-07-18: 30 ug/kg/min via INTRAVENOUS
  Administered 2019-07-18: 50 ug/kg/min via INTRAVENOUS
  Administered 2019-07-18: 40 ug/kg/min via INTRAVENOUS
  Administered 2019-07-19: 07:00:00 30 ug/kg/min via INTRAVENOUS
  Filled 2019-07-17 (×9): qty 100

## 2019-07-17 MED ORDER — INSULIN DETEMIR 100 UNIT/ML ~~LOC~~ SOLN
12.0000 [IU] | Freq: Every day | SUBCUTANEOUS | Status: DC
  Filled 2019-07-17: qty 0.12

## 2019-07-17 MED ORDER — ARTIFICIAL TEARS OPHTHALMIC OINT
1.0000 "application " | TOPICAL_OINTMENT | Freq: Three times a day (TID) | OPHTHALMIC | Status: DC
  Administered 2019-07-17 – 2019-07-25 (×20): 1 via OPHTHALMIC
  Filled 2019-07-17 (×3): qty 3.5

## 2019-07-17 MED ORDER — CHLORHEXIDINE GLUCONATE CLOTH 2 % EX PADS
6.0000 | MEDICATED_PAD | Freq: Every day | CUTANEOUS | Status: DC
  Administered 2019-07-18 – 2019-07-20 (×4): 6 via TOPICAL

## 2019-07-17 MED ORDER — PRAVASTATIN SODIUM 10 MG PO TABS
10.0000 mg | ORAL_TABLET | Freq: Every day | ORAL | Status: DC
  Administered 2019-07-17: 10:00:00 10 mg via ORAL
  Filled 2019-07-17 (×2): qty 1

## 2019-07-17 MED ORDER — FENTANYL 2500MCG IN NS 250ML (10MCG/ML) PREMIX INFUSION
50.0000 ug/h | INTRAVENOUS | Status: DC
  Administered 2019-07-17: 150 ug/h via INTRAVENOUS
  Filled 2019-07-17: qty 250

## 2019-07-17 MED ORDER — ZINC SULFATE 220 (50 ZN) MG PO CAPS
220.0000 mg | ORAL_CAPSULE | Freq: Every day | ORAL | Status: DC
  Administered 2019-07-19 – 2019-07-28 (×10): 220 mg
  Filled 2019-07-17 (×10): qty 1

## 2019-07-17 MED ORDER — BISACODYL 10 MG RE SUPP: 10.0000 mg | Freq: Every day | RECTAL | Status: DC | PRN

## 2019-07-17 MED ORDER — FENTANYL BOLUS VIA INFUSION
50.0000 ug | INTRAVENOUS | Status: DC | PRN
  Administered 2019-07-20 – 2019-07-21 (×5): 50 ug via INTRAVENOUS
  Filled 2019-07-17: qty 50

## 2019-07-17 MED ORDER — VITAL 1.5 CAL PO LIQD
1000.0000 mL | ORAL | Status: DC
  Administered 2019-07-17 – 2019-07-21 (×5): 1000 mL
  Filled 2019-07-17 (×4): qty 1000

## 2019-07-17 MED ORDER — VITAL AF 1.2 CAL PO LIQD: 1000.0000 mL | ORAL | Status: DC

## 2019-07-17 MED ORDER — FENTANYL 2500MCG IN NS 250ML (10MCG/ML) PREMIX INFUSION
50.0000 ug/h | INTRAVENOUS | Status: DC
  Administered 2019-07-17 – 2019-07-18 (×3): 200 ug/h via INTRAVENOUS
  Administered 2019-07-19: 225 ug/h via INTRAVENOUS
  Administered 2019-07-19: 250 ug/h via INTRAVENOUS
  Administered 2019-07-20 – 2019-07-21 (×4): 300 ug/h via INTRAVENOUS
  Filled 2019-07-17 (×9): qty 250

## 2019-07-17 NOTE — Progress Notes (Addendum)
Nutrition Follow-up / Consult  DOCUMENTATION CODES:   Not applicable  INTERVENTION:   To meet nutrition needs while requiring proning, change TF regimen:   Vital 1.5 at 40 ml/h (960 ml per day) via OG tube.   Pro-stat 60 ml TID.   Provides 2040 kcal, 155 gm protein, 733 ml free water daily.   Per ASPEN guidelines, recommend continue gastric feedings during prone positioning, keeping HOB elevated (reverse trendelenburg) to at least 10-25 degrees.    To decrease risk of aspiration during proning, recommend Cortrak placement postpyloric if possible. Cortrak service is available Tuesdays and Fridays at [facility].    NUTRITION DIAGNOSIS:   Increased nutrient needs related to acute illness(COVID PNA) as evidenced by estimated needs.  Ongoing  GOAL:   Patient will meet greater than or equal to 90% of their needs  Met with TF  MONITOR:   Vent status, Labs, I & O's  REASON FOR ASSESSMENT:   Consult Enteral/tube feeding initiation and management  ASSESSMENT:   63 yo male admitted with COVID PNA, AKI. PMH includes HTN, MI.   Discussed patient in ICU rounds and with RN today. Patient is being proned today. Received MD Consult for TF management from [facility]CM Pronation Therapy Protocol.  Patient is currently receiving Vital AF 1.2 at 70 ml/h, tolerating well.  Free water flushes 350 ml every 4 hours.  Patient remains intubated on ventilator support. MV: 8.8 L/min Temp (24hrs), Avg:98 F (36.7 C), Min:95.7 F (35.4 C), Max:99.7 F (37.6 C)  Propofol: off  Labs reviewed. Sodium 147 (H), BUN 132 (H), creatinine 2.15 (H), phosphorus 4.1 WDL, potassium 4.1 WDL CBG's: [phone]  Medications reviewed and include vitamin C, decadron, novolog, levemir, nimbex.   Weight up by 7.2 kg since admission I/O +11.8 L since admission  Diet Order:   Diet Order            Diet NPO time specified  Diet effective now              EDUCATION NEEDS:   Not appropriate for education  at this time  Skin:  Skin Assessment: Reviewed RN Assessment  Last BM:  11/22 type 7  Height:   Ht Readings from Last 1 Encounters:  07/16/2019 5' 6" (1.676 m)    Weight:   Wt Readings from Last 1 Encounters:  07/17/19 87.2 kg   Admission weight 80 kg (11/17) BMI=28.5  Ideal Body Weight:  64.5 kg  BMI:  Body mass index is 31.03 kg/m.  Estimated Nutritional Needs:   Kcal:  2080  Protein:  120-160 gm  Fluid:  >/= 2 L    Kimberly Hunnell, RD, LDN, CNSC Pager [phone] After Hours Pager [phone]  

## 2019-07-17 NOTE — Progress Notes (Signed)
Critical ABG results given to RN.  

## 2019-07-17 NOTE — Progress Notes (Addendum)
Inpatient Diabetes Program Recommendations  AACE/ADA: New Consensus Statement on Inpatient Glycemic Control (2015)  Target Ranges:  Prepandial:   less than 140 mg/dL      Peak postprandial:   less than 180 mg/dL (1-2 hours)      Critically ill patients:  140 - 180 mg/dL   Results for Craig, Mccoy (MRN [id number]) as of 07/17/2019 07:35  Ref. Range 07/15/2019 23:47 07/16/2019 01:08 07/16/2019 02:28 07/16/2019 03:33 07/16/2019 04:43 07/16/2019 05:55 07/16/2019 07:41 07/16/2019 09:09 07/16/2019 10:22 07/16/2019 14:04 07/16/2019 16:28 07/16/2019 19:37  Glucose-Capillary Latest Ref Range: 70 - 99 mg/dL 401 (H) 335 (H)  IV Insulin Drip started 370 (H)  IV Insulin Drip 339 (H)  IV Insulin Drip 277 (H)  IV Insulin Drip 225 (H)  IV Insulin Drip 174 (H)  IV Insulin Drip 178 (H)  IV Insulin Drip 178 (H)   23 units LEVEMIR given at 11am 220 (H)  7 units NOVOLOG  250 (H)  7 units NOVOLOG  241 (H)  7 units NOVOLOG    Results for Craig, Mccoy (MRN [id number]) as of 07/17/2019 08:52  Ref. Range 07/16/2019 23:44  Glucose-Capillary Latest Ref Range: 70 - 99 mg/dL 304 (H)  15 units NOVOLOG      Current Orders: Levemir 18 units Daily      Novolog Resistant Correction Scale/ SSI (0-20 units) Q4 hours     Getting Decadron 6 mg Daily.  IV Insulin yesterday from 1am until around 11am.  Getting Tube Feeds 70cc/hr.  Levemir started yesterday AM.    MD- Please consider the following in-hospital insulin adjustments:  1. Increase Levemir to 13 units BID (0.3 units/kg dosing)  2. Start Novolog Tube Feed Coverage:  Novolog 4 units Q4 hours  HOLD if tube feeds HELD for any reason      --Will follow patient during hospitalization--  Wyn Quaker RN, MSN, CDE Diabetes Coordinator Inpatient Glycemic Control Team Team Pager: [phone] (8a-5p)

## 2019-07-17 NOTE — Progress Notes (Addendum)
Reason for consult: Altered mental status, seizure  Subjective: No clinical seizure-like activity overnight.  Continues to have difficulty weaning sedation per RN.  ROS: Unable to obtain due to poor mental status  Examination  Vital signs in last 24 hours: Temp:  [95.7 F (35.4 C)-99.1 F (37.3 C)] 96.1 F (35.6 C) (11/23 1400) Pulse Rate:  [58-139] 68 (11/23 1400) Resp:  [12-37] 14 (11/23 1400) BP: (88-177)/(59-155) 91/59 (11/23 1400) SpO2:  [79 %-100 %] 95 % (11/23 1400) FiO2 (%):  [60 %-100 %] 100 % (11/23 1305) Weight:  [87.2 kg] 87.2 kg (11/23 0431)  General: lying in bed, not in apparent distress CVS: pulse-normal rate and rhythm RS: breathing comfortably, intubated Extremities: normal  Neuro: Comatose, sedated, does not open eyes to noxious stimuli   Basic Metabolic Panel: Recent Labs  Lab 07/13/19 2025  07/14/19 0404 07/14/19 0405 07/14/19 1700 07/15/19 0420 07/15/19 2020 07/16/19 0237 07/16/19 0430 07/17/19 0417  NA  --    < >  --  152*  --  149* 144 147* 148*  150* 147*  K  --    < >  --  5.0  --  4.8 4.5 4.5 4.3  4.2 4.1  CL  --   --   --  119*  --  114* 113*  --  112*  112* 113*  CO2  --   --   --  22  --  24 23  --  25  27 28   GLUCOSE  --   --   --  235*  --  396* 397*  --  295*  302* 316*  BUN  --   --   --  104*  --  124* 136*  --  138*  137* 132*  CREATININE  --   --   --  2.47*  --  2.43* 2.47*  --  2.39*  2.39* 2.15*  CALCIUM  --   --   --  7.7*  --  7.4* 7.2*  --  7.9*  8.0* 7.6*  MG 3.1*  --  3.1*  --  2.9*  --  2.9*  --  2.9*  --   PHOS 3.4  --   --  4.3 5.5* 5.0*  --   --  4.1  4.0 4.1   < > = values in this interval not displayed.    CBC: Recent Labs  Lab 07/10/2019 1220  07/12/19 0324  07/14/19 0404 07/15/19 0420 07/16/19 0237 07/16/19 0430 07/17/19 0416  WBC 9.5  --  5.4  --  7.9 6.7  --  9.1 7.3  NEUTROABS 8.4*  --   --   --  7.2 6.1  --  7.8* 6.4  HGB 15.3   < > 13.7   < > 12.5* 11.6* 11.6* 11.9* 10.3*  HCT 48.2   < >  40.9   < > 39.0 37.5* 34.0* 38.0* 32.9*  MCV 72.9*  --  70.8*  --  72.8* 76.1*  --  75.4* 74.8*  PLT 352  --  269  --  319 294  --  349 281   < > = values in this interval not displayed.     Coagulation Studies: No results for input(s): LABPROT, INR in the last 72 hours.  Imaging MRI brain without contrast 07/15/2019: No acute abnormality  Assessment:63 year old male who was admitted withacute hypoxic respiratory failure secondary toCOVID-19PNA, requiring intubation. He was noted to have jerking/tremor-like movements concerning for seizures. Therefore neurology was  consulted. EEG showed slowing consistent with a profound diffuseencephalopathy, no seizures or epileptiform discharges were seen. - ICU conditions:COVID-19 infectionandseptic shock vs component of cardiogenic shock in setting of acute systolic heart failure. Also with AKI, now off CRRT.Has been hyperglycemic.   Recommendations: -Not starting any antiepileptic drugs as LTM EEG did not show any interictal/ictal activity. -Continue management of COVID-19 per primary team. On Remdesivir x 5 days and Dexamethasone x 10 days, as well as SQ heparin for anticoagulation -Continue to correct metabolic derangements,per primary team -Attempt to wean sedation per ICU team -As needed IV Ativan 2 mg for generalized tonic-clonic seizure lasting more than 2 minutesorfocal seizure lasting more than 5 minutes.  Neurology will sign off.  Please page neuro hospitalist after 5 PM for any further questions.   CRITICAL CARE Performed by: Lora Havens   Total critical care time:11minutes  Critical care time was exclusive of separately billable procedures and treating other patients.  Critical care was necessary to treat or prevent imminent or life-threatening deterioration.  Critical care was time spent personally by me on the following activities: development of treatment plan with patient and/or surrogate as well as  nursing, discussions with consultants, evaluation of patient's response to treatment, examination of patient, obtaining history from patient or surrogate, ordering and performing treatments and interventions, ordering and review of laboratory studies, ordering and review of radiographic studies, pulse oximetry and re-evaluation of patient's condition.

## 2019-07-17 NOTE — Progress Notes (Signed)
Continues to have difficulty with oxygenation with increasing PEEP and FiO2.  Will add nimbex, continue diprivan and fentanyl with RASS goal -5.  Will proceed with prone ventilation for 16 hours.  Additional critical care time 24 minutes.  Chesley Mires, MD [facility] Pulmonary/Critical Care 07/17/2019, 3:25 PM

## 2019-07-17 NOTE — Progress Notes (Signed)
Attempted to contact family at listed numbers.  No answer.  Chesley Mires, MD [facility] Pulmonary/Critical Care 07/17/2019, 1:45 PM

## 2019-07-17 NOTE — Progress Notes (Signed)
Placed in prone position at 17:30.  Chesley Mires, MD [facility] Pulmonary/Critical Care 07/17/2019, 5:35 PM

## 2019-07-17 NOTE — Progress Notes (Signed)
eLink Physician-Brief Progress Note Patient Name: Craig Mccoy DOB: 06/08/1956 MRN: [id number]   Date of Service  07/17/2019  HPI/Events of Note  Patient seen agitated, tachycardic and hypertensive despite Fentanyl and Precedex  eICU Interventions  Will give Midazolam 2 mg IV prn for agitation     Intervention Category Minor Interventions: Agitation / anxiety - evaluation and management  Judd Lien 07/17/2019, 3:22 AM

## 2019-07-17 NOTE — Progress Notes (Signed)
Patient having multiple episodes of extreme agitation tonight. HR as high as 190 for varying lengths of time. Sitting up with eyes wide open attempting to turn head back and forth. Spitting out bite block. O2 Saturation in low 80's. Patient currently on continuous Precedex and Fentanyl gtt's. Requiring 200 mcg bolus of fentanyl and 30-60 minutes to get patients O2 Sat to come up. Patient FiO2 also increased to 100%. Call placed to [facility].  Given order for Versed pushes for agitation.  Milford Cage, RN

## 2019-07-17 NOTE — Progress Notes (Signed)
RT assisted RN's with placing pt in the prone position on mechanical ventilation. Prone pillow used to help prevent facial skin breakdown. SATs dropped to 80% after reposition. Lung recruitment performed, SATs now 89%. RT will continue to monitor.

## 2019-07-17 NOTE — Progress Notes (Signed)
NAME:  Craig Mccoy, MRN:  [id number], DOB:  13-Sep-1955, LOS: 6 ADMISSION DATE:  06/27/2019, CONSULTATION DATE:  11/17 REFERRING MD:  Craig Mccoy, CHIEF COMPLAINT:  Acute respiratory failure   Brief History   Craig Mccoy is a 63 yo male, COVID positive, who was transferred from [facility] to [facility] for iHD.   History of present illness   Craig Mccoy a 63 y.o.malewho has a PMH including but not limited to hypertension and prior MI per chart review (see "past medical history" for rest). Hepresented to [name] 11/17 with dyspnea and respiratory distress. He had apparently tested positive for COVID on 11/12 at an outside institution and had been in self isolation. His "lady friend" called EMS 11/17 due to above symptoms.  On EMS arrival, SpO2 was low despite supplemental O2. In ED, it was 77% before being placed on 15L NRB with improvement to 93%. Unfortunately, he deteriorated and required intubation. Repeat COVID testing returned as positive. He was also found to have multiple metabolic derangements including marked renal failure (SCr 14.63 with BUN 219), met acidosis + resp alkalosis (7.28, <19, 72), PCT 25.  Due to his potential to require iHD, he was transferred to [facility] for further evaluation and management.   Past Medical History  MI, HTN  Significant Hospital Events   11/17 - transferred to [facility] from [facility]  11/19 - possible seizure like activity witnessed by RN  Consults:  Nephrology neurology  Procedures:  ETT 11/17.  Echo - 11/18 cEEG 11/20 - 11/22 - diffuse encephalopathy. No seizures.   Significant Diagnostic Tests:  CXR - 11/17 - bilateral opacities HS-trop - 1604>>1504 Ferritin 2490 CRP - 38 PCT - 25.01 D-Dimer 2.52, Fibrinogen > 800.  PT -16.4; INR 1.3. PTT 29.  Echo - 35% LVEF. Mild LVH. basl akinesis. G1DD CT head - no acute abnormality MRI brain 11/21 No acute abnormality  Micro Data:  Blood Cx - 11/17 - NG4D  Respiratory Cx 11/17 - moraxella catarrhalis  Urine Cx 11/17 - no growth MRSA pcr - negative COVID 11/17 - positive  Antimicrobials:  CTX - 11/18 x 5 days Azithro - complete  Interim history/subjective:  Remains intubated and sedated Does not respond to voice  Objective   Blood pressure (!) 147/100, pulse (!) 110, temperature 97.7 F (36.5 C), resp. rate (!) 26, height _0  (1.676 m), weight 87.2 kg, SpO2 (!) 81 %.    Vent Mode: PRVC FiO2 (%):  [50 %-100 %] 100 % Set Rate:  [14 bmp] 14 bmp Vt Set:  [510 mL] 510 mL PEEP:  [10 cmH20-12 cmH20] 12 cmH20 Plateau Pressure:  [20 cmH20-23 cmH20] 23 cmH20   Intake/Output Summary (Last 24 hours) at 07/17/2019 0834 Last data filed at 07/17/2019 0700 Gross per 24 hour  Intake 3548.45 ml  Output 2955 ml  Net 593.45 ml   Filed Weights   07/15/19 0422 07/16/19 0449 07/17/19 0431  Weight: 84.7 kg 87.4 kg 87.2 kg    Examination: General: sedated, intubated. Does not respond to voice.  HENT: NCAT. ETT in place.  Lungs: LCTAB. No wheeze or crackles.  Cardiovascular: RRR. 1+ radial pulse Abdomen: soft. Does not react to palpation.  Extremities: no pitting edema.  Neuro: sedated   Resolved Hospital Problem list     Assessment & Plan:   COVID-19 pneumonia Acute respiratory failure with hypoxia requiring mechanical ventilation moroxella catarrhalis PNA - s/p azithro (11/18-11/21) and CTX (11/17 - 11/21) - Wean vent support as able: currently PRVC. Fi02 100%, PEEP  12, RR14 - propofol and precedex for sedation.  - fentanyl gtt stopped - versed 89m PRN q2h for agitation.  - Dexamethasone x 10 d (11/18-11/28 - Remdesivir x 5d (end 11/21) - SQ heparin for anticoag  - daily covid labs - improving.   Acute encephalopathy - one episode of seizure like activity on 11/18. No further episodes seen. EEG did not show epileptiform activity, but did show diffuse encephalopathy. Neurology following, recommends holding anti-epileptics for now. MRI brain 11/21 No acute abnormality.  etiology CNS depressing meds vs hypoactive delirium vs COVID-related encephalopathy  - Wean sedation as able. Difficult d/t pt agitation w/ tachycardia/desaturation -Delirium precautions  Shock, improved  -septic shock vs component of cardiogenic shock in setting of acute systolic heart failure. Currently off pressors.  - abx course complete  - MAP goal > 65   Acute systolic heart failure w/ elevated troponin - EF 35%, unclear if new. 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  -Strict  I/O  -MAP goal as above   AKI - improving. Cr trending down. Good UOP. Off CRRT.  -Trend renal indices and electrolytes  -nephrology signed off   -strict I/O  Hypernatremia -  Currently 147, corrected Na 150-152.  q4h FWF 3563m  -continue FWF  -daily Renal labs  Hyperglycemia  -has been > 300, on steroids, on dextrose gtt. Has remained on sensitive ssi P - on levemir 18U _0  and SSI q4h.  - consider increasing levemir dose and making BID.    Best practice:  Diet: EN Pain/Anxiety/Delirium protocol (if indicated): PAD protocol VAP protocol (if indicated): in place DVT prophylaxis: subQ heparin GI prophylaxis: protonix Glucose control:  Insulin gtt  Mobility: Bedrest Code Status: full Family Communication: Pending  [facility]   Labs   CBC: Recent Labs  Lab 07/21/2019 1220  07/12/19 0324  07/14/19 0404 07/15/19 0420 07/16/19 0237 07/16/19 0430 07/17/19 0416  WBC 9.5  --  5.4  --  7.9 6.7  --  9.1 7.3  NEUTROABS 8.4*  --   --   --  7.2 6.1  --  7.8* 6.4  HGB 15.3   < > 13.7   < > 12.5* 11.6* 11.6* 11.9* 10.3*  HCT 48.2   < > 40.9   < > 39.0 37.5* 34.0* 38.0* 32.9*  MCV 72.9*  --  70.8*  --  72.8* 76.1*  --  75.4* 74.8*  PLT 352  --  269  --  319 294  --  349 281   < > = values in this interval not displayed.    Basic Metabolic Panel: Recent Labs  Lab 07/13/19 2025  07/14/19 0404 07/14/19  0405 07/14/19 1700 07/15/19 0420 07/15/19 2020 07/16/19 0237 07/16/19 0430 07/17/19 0417  NA  --    < >  --  152*  --  149* 144 147* 148*  150* 147*  K  --    < >  --  5.0  --  4.8 4.5 4.5 4.3  4.2 4.1  CL  --   --   --  119*  --  114* 113*  --  112*  112* 113*  CO2  --   --   --  22  --  24 23  --  _1 GLUCOSE  --   --   --  235*  --  396* 397*  --  295*  302* 316*  BUN  --   --   --  104*  --  124* 136*  --  138*  137* 132*  CREATININE  --   --   --  2.47*  --  2.43* 2.47*  --  2.39*  2.39* 2.15*  CALCIUM  --   --   --  7.7*  --  7.4* 7.2*  --  7.9*  8.0* 7.6*  MG 3.1*  --  3.1*  --  2.9*  --  2.9*  --  2.9*  --   PHOS 3.4  --   --  4.3 5.5* 5.0*  --   --  4.1  4.0 4.1   < > = values in this interval not displayed.   GFR: Estimated Creatinine Clearance: 35.9 mL/min (A) (by C-G formula based on SCr of 2.15 mg/dL (H)). Recent Labs  Lab 07/04/2019 1220 07/13/2019 1357 07/07/2019 1600  07/14/19 0404 07/15/19 0420 07/16/19 0430 07/17/19 0416  PROCALCITON 25.01  --   --   --   --   --   --   --   WBC 9.5  --   --    < > 7.9 6.7 9.1 7.3  LATICACIDVEN  --  3.8* 3.7*  --   --   --   --   --    < > = values in this interval not displayed.    Liver Function Tests: Recent Labs  Lab 07/20/2019 1220  07/13/19 0432 07/14/19 0405 07/15/19 0420 07/16/19 0430 07/17/19 0417  AST 76*  --   --   --   --  27  --   ALT 65*  --   --   --   --  32  --   ALKPHOS 73  --   --   --   --  92  --   BILITOT 0.9  --   --   --   --  <0.1*  --   PROT 8.8*  --   --   --   --  5.9*  --   ALBUMIN 3.2*   < > 2.1* 1.8* 1.6* 1.8*  1.8* 1.6*   < > = values in this interval not displayed.   No results for input(s): LIPASE, AMYLASE in the last 168 hours. Recent Labs  Lab 07/13/2019 1357 07/16/19 1036  AMMONIA 30 23    ABG    Component Value Date/Time   PHART 7.341 (L) 07/16/2019 0237   PCO2ART 46.2 07/16/2019 0237   PO2ART 60.0 (L) 07/16/2019 0237   HCO3 25.0 07/16/2019 0237   TCO2  26 07/16/2019 0237   ACIDBASEDEF 1.0 07/16/2019 0237   O2SAT 89.0 07/16/2019 0237     Coagulation Profile: Recent Labs  Lab 07/12/19 0117  INR 1.3*    Cardiac Enzymes: No results for input(s): CKTOTAL, CKMB, CKMBINDEX, TROPONINI in the last 168 hours.  HbA1C: Hgb A1c MFr Bld  Date/Time Value Ref Range Status  07/12/2019 05:00 AM 7.0 (H) 4.8 - 5.6 % Final    Comment:    (NOTE) Pre diabetes:          5.7%-6.4% Diabetes:              >6.4% Glycemic control for   <7.0% adults with diabetes     CBG: Recent Labs  Lab 07/16/19 1022 07/16/19 1404 07/16/19 1628 07/16/19 1937 07/16/19 [place], PGY2 Family Medicine

## 2019-07-18 ENCOUNTER — Inpatient Hospital Stay (HOSPITAL_COMMUNITY): Payer: BC Managed Care – PPO

## 2019-07-18 DIAGNOSIS — J80 Acute respiratory distress syndrome: Secondary | ICD-10-CM | POA: Diagnosis not present

## 2019-07-18 DIAGNOSIS — U071 COVID-19: Secondary | ICD-10-CM | POA: Diagnosis not present

## 2019-07-18 LAB — POCT I-STAT 7, (LYTES, BLD GAS, ICA,H+H)
Acid-base deficit: 2 mmol/L (ref 0.0–2.0)
Acid-base deficit: 2 mmol/L (ref 0.0–2.0)
Acid-base deficit: 2 mmol/L (ref 0.0–2.0)
Bicarbonate: 28.2 mmol/L — ABNORMAL HIGH (ref 20.0–28.0)
Bicarbonate: 28 mmol/L (ref 20.0–28.0)
Bicarbonate: 26.5 mmol/L (ref 20.0–28.0)
Calcium, Ion: 1.12 mmol/L — ABNORMAL LOW (ref 1.15–1.40)
Calcium, Ion: 1.06 mmol/L — ABNORMAL LOW (ref 1.15–1.40)
Calcium, Ion: 1.05 mmol/L — ABNORMAL LOW (ref 1.15–1.40)
HCT: 33 % — ABNORMAL LOW (ref 39.0–52.0)
HCT: 34 % — ABNORMAL LOW (ref 39.0–52.0)
HCT: 27 % — ABNORMAL LOW (ref 39.0–52.0)
Hemoglobin: 11.2 g/dL — ABNORMAL LOW (ref 13.0–17.0)
Hemoglobin: 11.6 g/dL — ABNORMAL LOW (ref 13.0–17.0)
Hemoglobin: 9.2 g/dL — ABNORMAL LOW (ref 13.0–17.0)
O2 Saturation: 97 %
O2 Saturation: 97 %
O2 Saturation: 90 %
Patient temperature: 98.6
Patient temperature: 97.5
Patient temperature: 98.6
Potassium: 5.2 mmol/L — ABNORMAL HIGH (ref 3.5–5.1)
Potassium: 7 mmol/L (ref 3.5–5.1)
Potassium: 6 mmol/L — ABNORMAL HIGH (ref 3.5–5.1)
Sodium: 143 mmol/L (ref 135–145)
Sodium: 138 mmol/L (ref 135–145)
Sodium: 137 mmol/L (ref 135–145)
TCO2: 31 mmol/L (ref 22–32)
TCO2: 30 mmol/L (ref 22–32)
TCO2: 28 mmol/L (ref 22–32)
pCO2 arterial: 76.8 mmHg (ref 32.0–48.0)
pCO2 arterial: 71.2 mmHg (ref 32.0–48.0)
pCO2 arterial: 65 mmHg — ABNORMAL HIGH (ref 32.0–48.0)
pH, Arterial: 7.173 — CL (ref 7.350–7.450)
pH, Arterial: 7.2 — ABNORMAL LOW (ref 7.350–7.450)
pH, Arterial: 7.219 — ABNORMAL LOW (ref 7.350–7.450)
pO2, Arterial: 114 mmHg — ABNORMAL HIGH (ref 83.0–108.0)
pO2, Arterial: 106 mmHg (ref 83.0–108.0)
pO2, Arterial: 73 mmHg — ABNORMAL LOW (ref 83.0–108.0)

## 2019-07-18 LAB — CBC WITH DIFFERENTIAL/PLATELET
Abs Immature Granulocytes: 0.56 10*3/uL — ABNORMAL HIGH (ref 0.00–0.07)
Basophils Absolute: 0 10*3/uL (ref 0.0–0.1)
Basophils Relative: 0 %
Eosinophils Absolute: 0 10*3/uL (ref 0.0–0.5)
Eosinophils Relative: 0 %
HCT: 35.3 % — ABNORMAL LOW (ref 39.0–52.0)
Hemoglobin: 10.7 g/dL — ABNORMAL LOW (ref 13.0–17.0)
Immature Granulocytes: 6 %
Lymphocytes Relative: 2 %
Lymphs Abs: 0.2 10*3/uL — ABNORMAL LOW (ref 0.7–4.0)
MCH: 23.4 pg — ABNORMAL LOW (ref 26.0–34.0)
MCHC: 30.3 g/dL (ref 30.0–36.0)
MCV: 77.2 fL — ABNORMAL LOW (ref 80.0–100.0)
Monocytes Absolute: 0.3 10*3/uL (ref 0.1–1.0)
Monocytes Relative: 4 %
Neutro Abs: 8.3 10*3/uL — ABNORMAL HIGH (ref 1.7–7.7)
Neutrophils Relative %: 88 %
Platelets: 276 10*3/uL (ref 150–400)
RBC: 4.57 MIL/uL (ref 4.22–5.81)
RDW: 18 % — ABNORMAL HIGH (ref 11.5–15.5)
WBC: 9.4 10*3/uL (ref 4.0–10.5)
nRBC: 0 % (ref 0.0–0.2)

## 2019-07-18 LAB — RENAL FUNCTION PANEL
Albumin: 1.5 g/dL — ABNORMAL LOW (ref 3.5–5.0)
Anion gap: 10 (ref 5–15)
BUN: 142 mg/dL — ABNORMAL HIGH (ref 8–23)
CO2: 26 mmol/L (ref 22–32)
Calcium: 8 mg/dL — ABNORMAL LOW (ref 8.9–10.3)
Chloride: 107 mmol/L (ref 98–111)
Creatinine, Ser: 2.91 mg/dL — ABNORMAL HIGH (ref 0.61–1.24)
GFR calc Af Amer: 25 mL/min — ABNORMAL LOW (ref >60)
GFR calc non Af Amer: 22 mL/min — ABNORMAL LOW (ref >60)
Glucose, Bld: 334 mg/dL — ABNORMAL HIGH (ref 70–99)
Phosphorus: 7.4 mg/dL — ABNORMAL HIGH (ref 2.5–4.6)
Potassium: 5.5 mmol/L — ABNORMAL HIGH (ref 3.5–5.1)
Sodium: 143 mmol/L (ref 135–145)

## 2019-07-18 LAB — CULTURE, BLOOD (ROUTINE X 2)
Culture: NO GROWTH
Culture: NO GROWTH

## 2019-07-18 LAB — GLUCOSE, CAPILLARY
Glucose-Capillary: 195 mg/dL — ABNORMAL HIGH (ref 70–99)
Glucose-Capillary: 221 mg/dL — ABNORMAL HIGH (ref 70–99)
Glucose-Capillary: 245 mg/dL — ABNORMAL HIGH (ref 70–99)
Glucose-Capillary: 259 mg/dL — ABNORMAL HIGH (ref 70–99)
Glucose-Capillary: 260 mg/dL — ABNORMAL HIGH (ref 70–99)
Glucose-Capillary: 325 mg/dL — ABNORMAL HIGH (ref 70–99)

## 2019-07-18 LAB — BASIC METABOLIC PANEL
Anion gap: 9 (ref 5–15)
BUN: 157 mg/dL — ABNORMAL HIGH (ref 8–23)
CO2: 24 mmol/L (ref 22–32)
Calcium: 7.6 mg/dL — ABNORMAL LOW (ref 8.9–10.3)
Chloride: 107 mmol/L (ref 98–111)
Creatinine, Ser: 3.47 mg/dL — ABNORMAL HIGH (ref 0.61–1.24)
GFR calc Af Amer: 20 mL/min — ABNORMAL LOW (ref >60)
GFR calc non Af Amer: 18 mL/min — ABNORMAL LOW (ref >60)
Glucose, Bld: 308 mg/dL — ABNORMAL HIGH (ref 70–99)
Potassium: 6 mmol/L — ABNORMAL HIGH (ref 3.5–5.1)
Sodium: 140 mmol/L (ref 135–145)

## 2019-07-18 LAB — C-REACTIVE PROTEIN: CRP: 21.8 mg/dL — ABNORMAL HIGH (ref <1.0)

## 2019-07-18 LAB — D-DIMER, QUANTITATIVE: D-Dimer, Quant: 1.34 ug/mL-FEU — ABNORMAL HIGH (ref 0.00–0.50)

## 2019-07-18 LAB — TRIGLYCERIDES
Triglycerides: 432 mg/dL — ABNORMAL HIGH (ref <150)
Triglycerides: 482 mg/dL — ABNORMAL HIGH (ref <150)

## 2019-07-18 MED ORDER — TOCILIZUMAB 400 MG/20ML IV SOLN
8.0000 mg/kg | Freq: Once | INTRAVENOUS | Status: AC
  Administered 2019-07-18: 700 mg via INTRAVENOUS
  Filled 2019-07-18: qty 35

## 2019-07-18 MED ORDER — PRAVASTATIN SODIUM 10 MG PO TABS
10.0000 mg | ORAL_TABLET | Freq: Every day | ORAL | Status: DC
  Administered 2019-07-18 – 2019-07-28 (×11): 10 mg
  Filled 2019-07-18 (×11): qty 1

## 2019-07-18 MED ORDER — INSULIN DETEMIR 100 UNIT/ML ~~LOC~~ SOLN
20.0000 [IU] | Freq: Two times a day (BID) | SUBCUTANEOUS | Status: DC
  Administered 2019-07-18 – 2019-07-19 (×3): 20 [IU] via SUBCUTANEOUS
  Filled 2019-07-18 (×4): qty 0.2

## 2019-07-18 MED ORDER — NOREPINEPHRINE 4 MG/250ML-% IV SOLN
0.0000 ug/min | INTRAVENOUS | Status: DC
  Administered 2019-07-18: 22:00:00 25 ug/min via INTRAVENOUS
  Administered 2019-07-19: 12:00:00 10 ug/min via INTRAVENOUS
  Administered 2019-07-19: 04:00:00 15 ug/min via INTRAVENOUS
  Administered 2019-07-22: 5 ug/min via INTRAVENOUS
  Administered 2019-07-22 – 2019-07-24 (×2): 4 ug/min via INTRAVENOUS
  Filled 2019-07-18 (×5): qty 250

## 2019-07-18 MED ORDER — SODIUM CHLORIDE 0.9 % IV SOLN
INTRAVENOUS | Status: DC | PRN
  Administered 2019-07-21 – 2019-07-22 (×2): via INTRA_ARTERIAL

## 2019-07-18 MED ORDER — NOREPINEPHRINE 4 MG/250ML-% IV SOLN
INTRAVENOUS | Status: AC
  Administered 2019-07-18: 25 ug/min via INTRAVENOUS
  Filled 2019-07-18: qty 250

## 2019-07-18 MED ORDER — SODIUM ZIRCONIUM CYCLOSILICATE 10 G PO PACK
10.0000 g | PACK | Freq: Every day | ORAL | Status: DC
  Administered 2019-07-18 – 2019-07-19 (×2): 10 g via ORAL
  Filled 2019-07-18 (×2): qty 1

## 2019-07-18 NOTE — Progress Notes (Signed)
Had detailed discussion with pt's daughter and then his son over the phone.  Reviewed current status and treatment plan.  Discussed pros/cons of tocilizumab use in COVID pneumonia.  Explained this is not an FDA approved indication, but there is some evidence to suggest possible benefit.  Discussed risk of secondary infections.  They are agreeable to proceed with use of tocilizumab.  Chesley Mires, MD [facility] Pulmonary/Critical Care 07/18/2019, 11:14 AM

## 2019-07-18 NOTE — Progress Notes (Signed)
[place] Progress Note Patient Name: Craig Mccoy DOB: 04-Nov-1955 MRN: [id number]   Date of Service  07/18/2019  HPI/Events of Note  Hypotension on current sedation. Due to Covid-19 pneumonia and respiratory failure fluid restriction is appropriate. Last recorded K+ is 7.0, last PH 7.2.  eICU Interventions  Will start Norepinephrine infusion and target  MAP of 65 mmHg, will check ABG to r/o acidosis as driver of hypotension, will check BMP to f/u hyperkalemia.        [name] 07/18/2019, 8:52 PM

## 2019-07-18 NOTE — Procedures (Signed)
Arterial Catheter Insertion Procedure Note Craig Mccoy [id number] 05-02-1956  Procedure: Insertion of Arterial Catheter  Indications: Blood pressure monitoring and Frequent blood sampling  Procedure Details Consent: Unable to obtain consent because of emergent medical necessity. Time Out: Verified patient identification, verified procedure, site/side was marked, verified correct patient position, special equipment/implants available, medications/allergies/relevent history reviewed, required imaging and test results available.  Performed  Maximum sterile technique was used including antiseptics, cap, gloves, gown, hand hygiene, mask and sheet. Skin prep: Chlorhexidine; local anesthetic administered 20 gauge catheter was inserted into right radial artery using the Seldinger technique.  Evaluation Blood flow good; BP tracing good. Complications: No apparent complications.   Craig Mccoy ACNP Maryanna [facility] PCCM Pager [phone] till 3 pm If no answer page [phone] 07/18/2019, 11:51 AM

## 2019-07-18 NOTE — Progress Notes (Signed)
NAME:  Craig Mccoy, MRN:  [id number], DOB:  [birth year], LOS: 7 ADMISSION DATE:  06/28/2019, CONSULTATION DATE:  11/17 REFERRING MD:  Rogene Houston, CHIEF COMPLAINT:  Acute respiratory failure   Brief History   Craig Mccoy is a 63 yo male, COVID positive, who was transferred from [facility] to [facility] for iHD.   History of present illness   Craig Mccoy a 63 y.o.malewho has a PMH including but not limited to hypertension and prior MI per chart review (see "past medical history" for rest). Hepresented to [name] 11/17 with dyspnea and respiratory distress. He had apparently tested positive for COVID on 11/12 at an outside institution and had been in self isolation. His "lady friend" called EMS 11/17 due to above symptoms.  On EMS arrival, SpO2 was low despite supplemental O2. In ED, it was 77% before being placed on 15L NRB with improvement to 93%. Unfortunately, he deteriorated and required intubation. Repeat COVID testing returned as positive. He was also found to have multiple metabolic derangements including marked renal failure (SCr 14.63 with BUN 219), met acidosis + resp alkalosis (7.28, <19, 72), PCT 25.  Due to his potential to require iHD, he was transferred to [facility] for further evaluation and management.   Past Medical History  MI, HTN  Significant Hospital Events   11/17 - transferred to [facility] from [facility]  11/19 - possible seizure like activity witnessed by RN  Consults:  Nephrology neurology  Procedures:  ETT 11/17.  Echo - 11/18 cEEG 11/20 - 11/22 - diffuse encephalopathy. No seizures.   Significant Diagnostic Tests:  CXR - 11/17 - bilateral opacities HS-trop - 1604>>1504 Ferritin 2490 CRP - 38 PCT - 25.01 D-Dimer 2.52, Fibrinogen > 800.  PT -16.4; INR 1.3. PTT 29.  Echo - 35% LVEF. Mild LVH. basl akinesis. G1DD CT head - no acute abnormality MRI brain 11/21 No acute abnormality  Micro Data:  Blood Cx - 11/17 - NG4D  Respiratory Cx 11/17 - moraxella catarrhalis  Urine Cx 11/17 - no growth MRSA pcr - negative COVID 11/17 - positive  Antimicrobials:  CTX - 11/18 x 5 days Azithro - complete  Interim history/subjective:  Intubated, sedated.  In prone position.   Objective   Blood pressure (!) 84/47, pulse 93, temperature (!) 97 F (36.1 C), resp. rate (!) 24, height _0  (1.676 m), weight 87.5 kg, SpO2 100 %.    Vent Mode: PRVC FiO2 (%):  [100 %] 100 % Set Rate:  [14 bmp-24 bmp] 24 bmp Vt Set:  [380 mL-510 mL] 380 mL PEEP:  [10 cmH20-14 cmH20] 14 cmH20 Plateau Pressure:  [23 cmH20-36 cmH20] 26 cmH20   Intake/Output Summary (Last 24 hours) at 07/18/2019 0747 Last data filed at 07/18/2019 9169 Gross per 24 hour  Intake 3279.92 ml  Output 1340 ml  Net 1939.92 ml   Filed Weights   07/16/19 0449 07/17/19 0431 07/18/19 0230  Weight: 87.4 kg 87.2 kg 87.5 kg    Examination: General: sedated and intubated. In the prone position.  HENT: NCAT.  ETT in place Lungs: inspiratory crackles heard bilaterally. Decreased breath sounds.  Cardiovascular: RRR. 1+ radial pulse b/l.   Abdomen: unable to assess d/t proning Extremities: some mild nonpitting edema of the hands and feet. Hands and feet cool to touch, consistent with previous days.   Neuro: sedated   Resolved Hospital Problem list     Assessment & Plan:   COVID-19 pneumonia Acute respiratory failure with hypoxia requiring mechanical ventilation moroxella catarrhalis PNA - worsening  over past two days. s/p azithro (11/18-11/21) and CTX (11/17 - 11/21). - began proning pt yesterday. Continue as able.   - 230pm ABG.  - Wean vent support as able: currently PRVC. Fi02 90%, PEEP 12, RR 20 - paralytics, sedation with fentanyl and propofol infusion (monitor triglycerides daily) - Dexamethasone x 10 d (11/18-11/28) - Remdesivir x 5d (end 11/21) - SQ heparin for anticoag  - daily covid labs    Acute encephalopathy - one episode of seizure like activity on 11/18. No further episodes seen.  EEG did not show epileptiform activity, but did show diffuse encephalopathy. Neurology following, recommends holding anti-epileptics for now. MRI brain 11/21 No acute abnormality. etiology CNS depressing meds vs hypoactive delirium vs COVID-related encephalopathy  - Wean sedation as able. Difficult d/t pt agitation w/ tachycardia/desaturation -Delirium precautions - neurology signing off  Shock, resolved -septic shock vs component of cardiogenic shock in setting of acute systolic heart failure. Currently off pressors.  - abx course complete  - MAP goal > 65   Acute systolic heart failure w/ elevated troponin - EF 35%, unclear if new. 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  -Strict  I/O  -MAP goal as above   AKI .- had been improving but today worsened: Cr, K, Phos increasing. UOP remains normal.  Will monitor. -Trend renal indices and electrolytes  -nephrology signed off, consider reconsulting if continues to worsen or ptaient becomes oliguric.    -strict I/O - daily lokelma.   Hypernatremia -  Improving. Marland Kitchen  q4h FWF 340m.  -continue FWF  -daily Renal labs  Hyperglycemia  -has been > 300, on steroids, on dextrose gtt. On resistant SSI - increase levemir to 20U BID.   Best practice:  Diet: NPO Pain/Anxiety/Delirium protocol (if indicated): PAD protocol VAP protocol (if indicated): in place DVT prophylaxis: subQ heparin GI prophylaxis: pepcid Glucose control: levemir w/ rSSI Mobility: Bedrest Code Status: full Family Communication: Pending  [facility]   Labs   CBC: Recent Labs  Lab 07/14/19 0404 07/15/19 0420 07/16/19 0237 07/16/19 0430 07/17/19 0416 07/17/19 1854 07/18/19 0455  WBC 7.9 6.7  --  9.1 7.3  --  9.4  NEUTROABS 7.2 6.1  --  7.8* 6.4  --  8.3*  HGB 12.5* 11.6* 11.6* 11.9* 10.3* 11.6* 10.7*  HCT 39.0 37.5* 34.0* 38.0* 32.9* 34.0* 35.3*  MCV 72.8* 76.1*  --  75.4* 74.8*  --  77.2*  PLT 319  294  --  349 281  --  2785   Basic Metabolic Panel: Recent Labs  Lab 07/13/19 2025  07/14/19 0404  07/14/19 1700 07/15/19 0420 07/15/19 2020 07/16/19 0237 07/16/19 0430 07/17/19 0417 07/17/19 1854 07/18/19 0455  NA  --    < >  --    < >  --  149* 144 147* 148*  150* 147* 145 143  K  --    < >  --    < >  --  4.8 4.5 4.5 4.3  4.2 4.1 5.1 5.5*  CL  --   --   --    < >  --  114* 113*  --  112*  112* 113*  --  107  CO2  --   --   --    < >  --  24 23  --  _0 --  26  GLUCOSE  --   --   --    < >  --  396* 397*  --  295*  302* 316*  --  334*  BUN  --   --   --    < >  --  124* 136*  --  138*  137* 132*  --  142*  CREATININE  --   --   --    < >  --  2.43* 2.47*  --  2.39*  2.39* 2.15*  --  2.91*  CALCIUM  --   --   --    < >  --  7.4* 7.2*  --  7.9*  8.0* 7.6*  --  8.0*  MG 3.1*  --  3.1*  --  2.9*  --  2.9*  --  2.9*  --   --   --   PHOS 3.4  --   --    < > 5.5* 5.0*  --   --  4.1  4.0 4.1  --  7.4*   < > = values in this interval not displayed.   GFR: Estimated Creatinine Clearance: 26.6 mL/min (A) (by C-G formula based on SCr of 2.91 mg/dL (H)). Recent Labs  Lab 07/20/2019 1220 07/18/2019 1357 07/21/2019 1600  07/15/19 0420 07/16/19 0430 07/17/19 0416 07/18/19 0455  PROCALCITON 25.01  --   --   --   --   --   --   --   WBC 9.5  --   --    < > 6.7 9.1 7.3 9.4  LATICACIDVEN  --  3.8* 3.7*  --   --   --   --   --    < > = values in this interval not displayed.    Liver Function Tests: Recent Labs  Lab 07/06/2019 1220  07/14/19 0405 07/15/19 0420 07/16/19 0430 07/17/19 0417 07/18/19 0455  AST 76*  --   --   --  27  --   --   ALT 65*  --   --   --  32  --   --   ALKPHOS 73  --   --   --  92  --   --   BILITOT 0.9  --   --   --  <0.1*  --   --   PROT 8.8*  --   --   --  5.9*  --   --   ALBUMIN 3.2*   < > 1.8* 1.6* 1.8*  1.8* 1.6* 1.5*   < > = values in this interval not displayed.   No results for input(s): LIPASE, AMYLASE in the last 168 hours. Recent  Labs  Lab 07/23/2019 1357 07/16/19 1036  AMMONIA 30 23    ABG    Component Value Date/Time   PHART 7.232 (L) 07/17/2019 1854   PCO2ART 66.8 (HH) 07/17/2019 1854   PO2ART 78.0 (L) 07/17/2019 1854   HCO3 27.9 07/17/2019 1854   TCO2 30 07/17/2019 1854   ACIDBASEDEF 1.0 07/17/2019 1854   O2SAT 92.0 07/17/2019 1854     Coagulation Profile: Recent Labs  Lab 07/12/19 0117  INR 1.3*    Cardiac Enzymes: No results for input(s): CKTOTAL, CKMB, CKMBINDEX, TROPONINI in the last 168 hours.  HbA1C: Hgb A1c MFr Bld  Date/Time Value Ref Range Status  07/12/2019 05:00 AM 7.0 (H) 4.8 - 5.6 % Final    Comment:    (NOTE) Pre diabetes:          5.7%-6.4% Diabetes:              >  6.4% Glycemic control for   <7.0% adults with diabetes     CBG: Recent Labs  Lab 07/17/19 1246 07/17/19 1700 07/17/19 2022 07/17/19 2353 07/18/19 [place], PGY2 Family Medicine

## 2019-07-18 NOTE — Progress Notes (Signed)
Inpatient Diabetes Program Recommendations  AACE/ADA: New Consensus Statement on Inpatient Glycemic Control (2015)  Target Ranges:  Prepandial:   less than 140 mg/dL      Peak postprandial:   less than 180 mg/dL (1-2 hours)      Critically ill patients:  140 - 180 mg/dL   Results for VEDANT, MENDIAS (MRN [id number]) as of 07/18/2019 07:22  Ref. Range 07/16/2019 23:44 07/17/2019 04:12 07/17/2019 08:11 07/17/2019 12:46 07/17/2019 17:00 07/17/2019 20:22  Glucose-Capillary Latest Ref Range: 70 - 99 mg/dL 304 (H)  15 units NOVOLOG  277 (H)  11 units NOVOLOG  224 (H)  7 units NOVOLOG +  18 units LEVEMIR given at 10:14am  191 (H)  4 units NOVOLOG +  15 units LEVEMIR given at 12:41pm  268 (H)  11 units NOVOLOG  282 (H)  11 units NOVOLOG +  15 units LEVEMIR given at 9:22pm    Results for GRAYLAND, BANUELOS (MRN [id number]) as of 07/18/2019 07:22  Ref. Range 07/17/2019 23:53 07/18/2019 04:36  Glucose-Capillary Latest Ref Range: 70 - 99 mg/dL 307 (H)  15 units NOVOLOG  325 (H)  15 units NOVOLOG     Current Orders: Levemir 15 units BID                            Novolog Resistant Correction Scale/ SSI (0-20 units) Q4 hours     Getting Decadron 6 mg Daily.  Getting Tube Feeds 40cc/hr.      MD- Note Levemir increased yesterday--Got total of 48 units Levemir yesterday--only scheduled to get total of 30 units today.  CBGs >300 mg/dl  May consider starting the IV Insulin Drip per the COVID 19 Glycemic Control Order set--Chose Transition to IV Insulin--If decision made to start IV Insulin, please remember to d/c all the SQ Insulin orders  If you do not wish to start IV Insulin, recommend the following SQ insulin adjustments:  1. Increase Levemir to 25 units BID  2. Start Novolog Tube Feed Coverage: Novolog 8 units Q4 hours  HOLD if tube feeds HELD for any reason     --Will follow patient during hospitalization--  Wyn Quaker RN, MSN,  CDE Diabetes Coordinator Inpatient Glycemic Control Team Team Pager: [phone] (8a-5p)

## 2019-07-18 NOTE — Procedures (Signed)
Cortrak  Person Inserting Tube:  Jacklynn Barnacle E, RD Tube Type:  Cortrak - 43 inches Tube Location:  Right nare Initial Placement:  Stomach Secured by: Bridle Technique Used to Measure Tube Placement:  Documented cm marking at nare/ corner of mouth Cortrak Secured At:  92 cm    Cortrak Tube Team Note:  Consult received to place a Cortrak feeding tube.   X-ray is required, abdominal x-ray has been ordered by the Cortrak team. Please confirm tube placement before using the Cortrak tube.   If the tube becomes dislodged please keep the tube and contact the Cortrak team at [email or web address] ([email or web address]) for replacement.  If after hours and replacement cannot be delayed, place a NG tube and confirm placement with an abdominal x-ray.    Jacklynn Barnacle, MS, RD, LDN Office: [phone] Pager: [phone] After Hours/Weekend Pager: [phone]

## 2019-07-18 NOTE — Progress Notes (Signed)
Pt placed back in semi fowlers position at 30 degrees from prone position. Tube position checked and pushed back in 1 cm to 25cm. Tube holder replaced. BBS are rhonchus/diminished. PIP 30. RR increased from 24 to 30 due to blood gas results. MD aware. RT will continue to monitor.

## 2019-07-19 ENCOUNTER — Inpatient Hospital Stay (HOSPITAL_COMMUNITY): Payer: BC Managed Care – PPO

## 2019-07-19 DIAGNOSIS — J80 Acute respiratory distress syndrome: Secondary | ICD-10-CM | POA: Diagnosis not present

## 2019-07-19 DIAGNOSIS — U071 COVID-19: Secondary | ICD-10-CM | POA: Diagnosis not present

## 2019-07-19 LAB — RENAL FUNCTION PANEL
Albumin: 1.4 g/dL — ABNORMAL LOW (ref 3.5–5.0)
Albumin: 1.5 g/dL — ABNORMAL LOW (ref 3.5–5.0)
Anion gap: 10 (ref 5–15)
Anion gap: 8 (ref 5–15)
BUN: 158 mg/dL — ABNORMAL HIGH (ref 8–23)
BUN: 158 mg/dL — ABNORMAL HIGH (ref 8–23)
CO2: 24 mmol/L (ref 22–32)
CO2: 24 mmol/L (ref 22–32)
Calcium: 7.8 mg/dL — ABNORMAL LOW (ref 8.9–10.3)
Calcium: 7.7 mg/dL — ABNORMAL LOW (ref 8.9–10.3)
Chloride: 104 mmol/L (ref 98–111)
Chloride: 108 mmol/L (ref 98–111)
Creatinine, Ser: 3.59 mg/dL — ABNORMAL HIGH (ref 0.61–1.24)
Creatinine, Ser: 3.29 mg/dL — ABNORMAL HIGH (ref 0.61–1.24)
GFR calc Af Amer: 20 mL/min — ABNORMAL LOW (ref >60)
GFR calc Af Amer: 22 mL/min — ABNORMAL LOW (ref >60)
GFR calc non Af Amer: 17 mL/min — ABNORMAL LOW (ref >60)
GFR calc non Af Amer: 19 mL/min — ABNORMAL LOW (ref >60)
Glucose, Bld: 312 mg/dL — ABNORMAL HIGH (ref 70–99)
Glucose, Bld: 297 mg/dL — ABNORMAL HIGH (ref 70–99)
Phosphorus: 8.7 mg/dL — ABNORMAL HIGH (ref 2.5–4.6)
Phosphorus: 7.2 mg/dL — ABNORMAL HIGH (ref 2.5–4.6)
Potassium: 5.6 mmol/L — ABNORMAL HIGH (ref 3.5–5.1)
Potassium: 5.5 mmol/L — ABNORMAL HIGH (ref 3.5–5.1)
Sodium: 138 mmol/L (ref 135–145)
Sodium: 140 mmol/L (ref 135–145)

## 2019-07-19 LAB — CBC
HCT: 31.9 % — ABNORMAL LOW (ref 39.0–52.0)
Hemoglobin: 10 g/dL — ABNORMAL LOW (ref 13.0–17.0)
MCH: 23.6 pg — ABNORMAL LOW (ref 26.0–34.0)
MCHC: 31.3 g/dL (ref 30.0–36.0)
MCV: 75.4 fL — ABNORMAL LOW (ref 80.0–100.0)
Platelets: 303 10*3/uL (ref 150–400)
RBC: 4.23 MIL/uL (ref 4.22–5.81)
RDW: 18.2 % — ABNORMAL HIGH (ref 11.5–15.5)
WBC: 10.7 10*3/uL — ABNORMAL HIGH (ref 4.0–10.5)
nRBC: 0 % (ref 0.0–0.2)

## 2019-07-19 LAB — POCT I-STAT 7, (LYTES, BLD GAS, ICA,H+H)
Acid-base deficit: 1 mmol/L (ref 0.0–2.0)
Bicarbonate: 26.2 mmol/L (ref 20.0–28.0)
Calcium, Ion: 1.11 mmol/L — ABNORMAL LOW (ref 1.15–1.40)
HCT: 28 % — ABNORMAL LOW (ref 39.0–52.0)
Hemoglobin: 9.5 g/dL — ABNORMAL LOW (ref 13.0–17.0)
O2 Saturation: 98 %
Patient temperature: 36.5
Potassium: 5.5 mmol/L — ABNORMAL HIGH (ref 3.5–5.1)
Sodium: 139 mmol/L (ref 135–145)
TCO2: 28 mmol/L (ref 22–32)
pCO2 arterial: 58.4 mmHg — ABNORMAL HIGH (ref 32.0–48.0)
pH, Arterial: 7.258 — ABNORMAL LOW (ref 7.350–7.450)
pO2, Arterial: 128 mmHg — ABNORMAL HIGH (ref 83.0–108.0)

## 2019-07-19 LAB — HEPATIC FUNCTION PANEL
ALT: 56 U/L — ABNORMAL HIGH (ref 0–44)
AST: 50 U/L — ABNORMAL HIGH (ref 15–41)
Albumin: 1.4 g/dL — ABNORMAL LOW (ref 3.5–5.0)
Alkaline Phosphatase: 105 U/L (ref 38–126)
Bilirubin, Direct: 0.1 mg/dL (ref 0.0–0.2)
Indirect Bilirubin: 0.5 mg/dL (ref 0.3–0.9)
Total Bilirubin: 0.6 mg/dL (ref 0.3–1.2)
Total Protein: 5.3 g/dL — ABNORMAL LOW (ref 6.5–8.1)

## 2019-07-19 LAB — GLUCOSE, CAPILLARY
Glucose-Capillary: 269 mg/dL — ABNORMAL HIGH (ref 70–99)
Glucose-Capillary: 273 mg/dL — ABNORMAL HIGH (ref 70–99)
Glucose-Capillary: 278 mg/dL — ABNORMAL HIGH (ref 70–99)
Glucose-Capillary: 289 mg/dL — ABNORMAL HIGH (ref 70–99)
Glucose-Capillary: 297 mg/dL — ABNORMAL HIGH (ref 70–99)
Glucose-Capillary: 321 mg/dL — ABNORMAL HIGH (ref 70–99)
Glucose-Capillary: 335 mg/dL — ABNORMAL HIGH (ref 70–99)

## 2019-07-19 LAB — TRIGLYCERIDES: Triglycerides: 292 mg/dL — ABNORMAL HIGH (ref <150)

## 2019-07-19 MED ORDER — PRISMASOL BGK 4/2.5 32-4-2.5 MEQ/L REPLACEMENT SOLN
Status: DC
  Administered 2019-07-19 – 2019-07-20 (×2): via INTRAVENOUS_CENTRAL
  Filled 2019-07-19 (×3): qty 5000

## 2019-07-19 MED ORDER — MIDAZOLAM BOLUS VIA INFUSION
1.0000 mg | INTRAVENOUS | Status: DC | PRN
  Administered 2019-07-19 – 2019-07-26 (×8): 2 mg via INTRAVENOUS
  Filled 2019-07-19: qty 2

## 2019-07-19 MED ORDER — PRISMASOL BGK 4/2.5 32-4-2.5 MEQ/L IV SOLN
INTRAVENOUS | Status: DC
  Administered 2019-07-19 – 2019-07-21 (×17): via INTRAVENOUS_CENTRAL
  Filled 2019-07-19 (×25): qty 5000

## 2019-07-19 MED ORDER — HEPARIN SODIUM (PORCINE) 1000 UNIT/ML DIALYSIS
1000.0000 [IU] | INTRAMUSCULAR | Status: DC | PRN
  Filled 2019-07-19: qty 6
  Filled 2019-07-19: qty 3

## 2019-07-19 MED ORDER — MIDAZOLAM 50MG/50ML (1MG/ML) PREMIX INFUSION
0.0000 mg/h | INTRAVENOUS | Status: DC
  Administered 2019-07-19: 2 mg/h via INTRAVENOUS
  Administered 2019-07-19: 4 mg/h via INTRAVENOUS
  Administered 2019-07-20: 22:00:00 8 mg/h via INTRAVENOUS
  Administered 2019-07-20: 12:00:00 10 mg/h via INTRAVENOUS
  Administered 2019-07-20: 07:00:00 8 mg/h via INTRAVENOUS
  Administered 2019-07-20: 16:00:00 9 mg/h via INTRAVENOUS
  Administered 2019-07-20: 5 mg/h via INTRAVENOUS
  Administered 2019-07-21 (×2): 10 mg/h via INTRAVENOUS
  Administered 2019-07-21: 23:00:00 6 mg/h via INTRAVENOUS
  Administered 2019-07-22: 8 mg/h via INTRAVENOUS
  Administered 2019-07-22: 07:00:00 6 mg/h via INTRAVENOUS
  Administered 2019-07-22: 8 mg/h via INTRAVENOUS
  Administered 2019-07-23 (×2): 4 mg/h via INTRAVENOUS
  Administered 2019-07-24 (×4): 6 mg/h via INTRAVENOUS
  Administered 2019-07-25 – 2019-07-26 (×5): 7 mg/h via INTRAVENOUS
  Administered 2019-07-27: 4 mg/h via INTRAVENOUS
  Administered 2019-07-28: 10 mg/h via INTRAVENOUS
  Administered 2019-07-28: 3 mg/h via INTRAVENOUS
  Filled 2019-07-19 (×31): qty 50

## 2019-07-19 MED ORDER — PRISMASOL BGK 4/2.5 32-4-2.5 MEQ/L REPLACEMENT SOLN
Status: DC
  Administered 2019-07-19 – 2019-07-28 (×17): via INTRAVENOUS_CENTRAL
  Filled 2019-07-19 (×24): qty 5000

## 2019-07-19 MED ORDER — FREE WATER: 200.0000 mL | Freq: Three times a day (TID) | Status: DC

## 2019-07-19 MED ORDER — INSULIN DETEMIR 100 UNIT/ML ~~LOC~~ SOLN
30.0000 [IU] | Freq: Two times a day (BID) | SUBCUTANEOUS | Status: DC
  Administered 2019-07-19 – 2019-07-28 (×18): 30 [IU] via SUBCUTANEOUS
  Filled 2019-07-19 (×19): qty 0.3

## 2019-07-19 NOTE — Progress Notes (Signed)
NAME:  Craig Mccoy, MRN:  [id number], DOB:  03/08/1956, LOS: 8 ADMISSION DATE:  07/01/2019, CONSULTATION DATE:  11/17 REFERRING MD:  Craig Mccoy, CHIEF COMPLAINT:  Acute respiratory failure   Brief History   Craig Mccoy is a 63 yo male, COVID positive, who was transferred from [facility] to [facility] for iHD.   History of present illness   Craig Mccoy a 63 y.o.malewho has a PMH including but not limited to hypertension and prior MI per chart review (see "past medical history" for rest). Hepresented to [name] 11/17 with dyspnea and respiratory distress. He had apparently tested positive for COVID on 11/12 at an outside institution and had been in self isolation. His "lady friend" called EMS 11/17 due to above symptoms.  On EMS arrival, SpO2 was low despite supplemental O2. In ED, it was 77% before being placed on 15L NRB with improvement to 93%. Unfortunately, he deteriorated and required intubation. Repeat COVID testing returned as positive. He was also found to have multiple metabolic derangements including marked renal failure (SCr 14.63 with BUN 219), met acidosis + resp alkalosis (7.28, <19, 72), PCT 25.  Due to his potential to require iHD, he was transferred to [facility] for further evaluation and management.   Past Medical History  MI, HTN  Significant Hospital Events   11/17 - transferred to [facility] from [facility]  11/19 - possible seizure like activity witnessed by RN  Consults:  Nephrology neurology  Procedures:  ETT 11/17.  Echo - 11/18 cEEG 11/20 - 11/22 - diffuse encephalopathy. No seizures.   Significant Diagnostic Tests:  CXR - 11/17 - bilateral opacities CXR - 11/25 - consistent b/l opacities concerning for edema or PNA w/ b/l pleural effusions.  HS-trop - 1604>>1504 Ferritin 2490>>>890 CRP - 38>>9.8>21.8 PCT - 25.01 D-Dimer 2.52>>>1.34; Fibrinogen > 800.  PT -16.4; INR 1.3. PTT 29.  Echo - 35% LVEF. Mild LVH. basl akinesis. G1DD CT head - no acute abnormality MRI brain  11/21 No acute abnormality  Micro Data:  Blood Cx - 11/17 - NG4D  Respiratory Cx 11/17 - moraxella catarrhalis Urine Cx 11/17 - no growth MRSA pcr - negative COVID 11/17 - positive  Antimicrobials:  CTX - 11/18 x 5 days Azithro - complete  Interim history/subjective:  Intubated, sedated. Overnight events: pt became hypotensive and levophed was started.   Objective   Blood pressure 128/67, pulse 81, temperature (!) 97.2 F (36.2 C), resp. rate (!) 32, height '5\' 6"'$  (1.676 m), weight 92.4 kg, SpO2 98 %. CVP:  [13 mmHg] 13 mmHg  Vent Mode: PRVC FiO2 (%):  [80 %-100 %] 80 % Set Rate:  [30 bmp-32 bmp] 32 bmp Vt Set:  [380 mL] 380 mL PEEP:  [12 cmH20] 12 cmH20 Plateau Pressure:  [22 cmH20-27 cmH20] 22 cmH20   Intake/Output Summary (Last 24 hours) at 07/19/2019 0806 Last data filed at 07/19/2019 0600 Gross per 24 hour  Intake 4176.44 ml  Output 1130 ml  Net 3046.44 ml   Filed Weights   07/17/19 0431 07/18/19 0230 07/19/19 0217  Weight: 87.2 kg 87.5 kg 92.4 kg    Examination: General: sedated and intubated and on paralytics. Lying on back.   HENT: NCAT. ETT in place.  Lungs: mechanical breath sounds. No inspiratory crackles appreciated.  Cardiovascular: RRR. Trace b/l radial and DP pulses.  Abdomen: soft, nontender.  Extremities: nonpitting edema of the feet.  Warm to touch.  Neuro: sedated.   Resolved Hospital Problem list     Assessment & Plan:  COVID-19 pneumonia Acute respiratory failure with hypoxia requiring mechanical ventilation moroxella catarrhalis PNA - still not improving.  s/p azithro (11/18-11/21) and CTX (11/17 - 11/21). - began proning pt yesterday. Continue as able.  PH stable in ~7.2 range w/ elevated CO2. PO2 improving.  - 230pm ABG.  - Wean vent support as able: currently PRVC. Fi02 90%, PEEP 12, RR 20 - paralytics, sedation with fentanyl and versed. infusion  - Dexamethasone x 10 d (11/18-11/28) - Remdesivir x 5d (end 11/21) - SQ heparin for  anticoag  - daily covid labs    Acute encephalopathy - one episode of seizure like activity on 11/18. No further episodes seen. EEG did not show epileptiform activity, but did show diffuse encephalopathy. Neurology following, recommends holding anti-epileptics for now. MRI brain 11/21 No acute abnormality. etiology CNS depressing meds vs hypoactive delirium vs COVID-related encephalopathy  - Wean sedation as able. Currently has RASS goal -4 to -5 d/t paralytics.  -Delirium precautions - neurology signing off  Shock, - worsened last night and pt started on levophed gtt again.  septic shock vs component of cardiogenic shock in setting of acute systolic heart failure. Pt currently hypothermic, wbc ~10.7.  - levophed gtt. Wean as able. - abx course complete  - MAP goal > 65   Acute systolic heart failure w/ elevated troponin - EF 35%, unclear if new. 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  -Strict  I/O  -MAP goal as above   AKI .- worsening after initially improving. Pt had been having good UOP but has decreased over past two days. < 1081m on 11/24. . Likely contributing to his worsening respiratory status.  -  re-consult nephrology for possible need for CRRT -Trend renal indices and electrolytes    -strict I/O  Hyperkalemia - > 5.5 over past several days. On daily lokelma.  - consisder increasing to BID lokelma.  - daily renal labs  Hypernatremia -  resolved.  -reduce FWF to 207mq8h.  -daily Renal labs  Hyperglycemia  -has been > 300, on steroids, on dextrose gtt. On resistant SSI and levemir 20U BID - increase levemir  Best practice:  Diet: NPO Pain/Anxiety/Delirium protocol (if indicated): PAD protocol VAP protocol (if indicated): in place DVT prophylaxis: subQ heparin GI prophylaxis: pepcid Glucose control: levemir w/ rSSI Mobility: Bedrest Code Status: full Family Communication: Pending  [facility]    Labs   CBC: Recent Labs  Lab 07/14/19 0404 07/15/19 0420  07/16/19 0430 07/17/19 0416  07/18/19 0455 07/18/19 0927 07/18/19 1432 07/18/19 2102 07/19/19 0324  WBC 7.9 6.7  --  9.1 7.3  --  9.4  --   --   --  10.7*  NEUTROABS 7.2 6.1  --  7.8* 6.4  --  8.3*  --   --   --   --   HGB 12.5* 11.6*   < > 11.9* 10.3*   < > 10.7* 11.2* 11.6* 9.2* 10.0*  HCT 39.0 37.5*   < > 38.0* 32.9*   < > 35.3* 33.0* 34.0* 27.0* 31.9*  MCV 72.8* 76.1*  --  75.4* 74.8*  --  77.2*  --   --   --  75.4*  PLT 319 294  --  349 281  --  276  --   --   --  303   < > = values in this interval not displayed.    Basic Metabolic Panel: Recent Labs  Lab 07/13/19 2025  07/14/19  0404  07/14/19 1700 07/15/19 0420 07/15/19 2020  07/16/19 0430 07/17/19 0417  07/18/19 0455 07/18/19 0927 07/18/19 1432 07/18/19 2058 07/18/19 2102 07/19/19 0324  NA  --    < >  --    < >  --  149* 144   < > 148*  150* 147*   < > 143 143 138 140 137 138  K  --    < >  --    < >  --  4.8 4.5   < > 4.3  4.2 4.1   < > 5.5* 5.2* 7.0* 6.0* 6.0* 5.6*  CL  --   --   --    < >  --  114* 113*  --  112*  112* 113*  --  107  --   --  107  --  104  CO2  --   --   --    < >  --  24 23  --  '25  27 28  '$ --  26  --   --  24  --  24  GLUCOSE  --   --   --    < >  --  396* 397*  --  295*  302* 316*  --  334*  --   --  308*  --  312*  BUN  --   --   --    < >  --  124* 136*  --  138*  137* 132*  --  142*  --   --  157*  --  158*  CREATININE  --   --   --    < >  --  2.43* 2.47*  --  2.39*  2.39* 2.15*  --  2.91*  --   --  3.47*  --  3.59*  CALCIUM  --   --   --    < >  --  7.4* 7.2*  --  7.9*  8.0* 7.6*  --  8.0*  --   --  7.6*  --  7.8*  MG 3.1*  --  3.1*  --  2.9*  --  2.9*  --  2.9*  --   --   --   --   --   --   --   --   PHOS 3.4  --   --    < > 5.5* 5.0*  --   --  4.1  4.0 4.1  --  7.4*  --   --   --   --  8.7*   < > = values in this interval not displayed.   GFR: Estimated Creatinine Clearance: 22.1 mL/min (A) (by C-G formula based  on SCr of 3.59 mg/dL (H)). Recent Labs  Lab 07/16/19 0430 07/17/19 0416 07/18/19 0455 07/19/19 0324  WBC 9.1 7.3 9.4 10.7*    Liver Function Tests: Recent Labs  Lab 07/15/19 0420 07/16/19 0430 07/17/19 0417 07/18/19 0455 07/19/19 0324  AST  --  27  --   --  50*  ALT  --  32  --   --  56*  ALKPHOS  --  92  --   --  105  BILITOT  --  <0.1*  --   --  0.6  PROT  --  5.9*  --   --  5.3*  ALBUMIN 1.6* 1.8*  1.8* 1.6* 1.5* 1.4*  1.4*   No results for input(s): LIPASE, AMYLASE in the last 168 hours. Recent  Labs  Lab 07/16/19 1036  AMMONIA 23    ABG    Component Value Date/Time   PHART 7.219 (L) 07/18/2019 2102   PCO2ART 65.0 (H) 07/18/2019 2102   PO2ART 73.0 (L) 07/18/2019 2102   HCO3 26.5 07/18/2019 2102   TCO2 28 07/18/2019 2102   ACIDBASEDEF 2.0 07/18/2019 2102   O2SAT 90.0 07/18/2019 2102     Coagulation Profile: No results for input(s): INR, PROTIME in the last 168 hours.  Cardiac Enzymes: No results for input(s): CKTOTAL, CKMB, CKMBINDEX, TROPONINI in the last 168 hours.  HbA1C: Hgb A1c MFr Bld  Date/Time Value Ref Range Status  07/12/2019 05:00 AM 7.0 (H) 4.8 - 5.6 % Final    Comment:    (NOTE) Pre diabetes:          5.7%-6.4% Diabetes:              >6.4% Glycemic control for   <7.0% adults with diabetes     CBG: Recent Labs  Lab 07/18/19 1159 07/18/19 1616 07/18/19 1942 07/18/19 2347 07/19/19 [place], PGY2 Family Medicine

## 2019-07-19 NOTE — Progress Notes (Signed)
Inpatient Diabetes Program Recommendations  AACE/ADA: New Consensus Statement on Inpatient Glycemic Control (2015)  Target Ranges:  Prepandial:   less than 140 mg/dL      Peak postprandial:   less than 180 mg/dL (1-2 hours)      Critically ill patients:  140 - 180 mg/dL   Lab Results  Component Value Date   GLUCAP 335 (H) 07/19/2019   HGBA1C 7.0 (H) 07/12/2019    Review of Glycemic Control Results for Craig Mccoy, Craig Mccoy (MRN [id number]) as of 07/19/2019 13:03  Ref. Range 07/18/2019 23:47 07/19/2019 03:02 07/19/2019 08:26  Glucose-Capillary Latest Ref Range: 70 - 99 mg/dL 259 (H) 297 (H) 335 (H)  Current Orders:Levemir 30 units BID Novolog Resistant Correction Scale/ SSI (0-20 units)Q4 hours Getting Decadron 6 mg Daily. Inpatient Diabetes Program Recommendations:    Consider also adding Novolog tube feed coverage 4 units q 4 hours.    Thanks  Adah Perl, RN, BC-ADM Inpatient Diabetes Coordinator Pager [phone] (8a-5p)

## 2019-07-19 NOTE — Progress Notes (Addendum)
[facility] Progress Note    Assessment/ Plan:   1. AKI - secondary to sepsis + COVID now with ATN and hypotension.  Initially required a short run of CRRT 11/17-11/18. Now with worsening renal function.  Spoke with his son, Kasandra Knudsen [phone] who does agree with proceed with CRRT  - Start CRRT; goal UF net even to start then net negative 50 cc/hr as tolerated  - needs nepro when on feeds  2. COVID PNA s/p Remdesivir; decadron per PCCM  3. Acute hypoxic respiratory failure on mechanical ventilation per pulm; optimize volume status with CRRT  4. Hyperkalemia: with AKI - for CRRT.  Discontinue lokelma   5. Septic shock:  Therapies per primary team; on levo    6. Hyperphos - follow off of binders - starting CRRT  6. Hypernatremia: resolved.  Discontinued additional free water for now and monitor     Subjective:    On CRRT earlier this admission - improved and off of 11/18.  Renal signed and off and was reconsulted today with worsening azotemia and hyperkalemia.  He has been on levophed which was started last night.  Had 925 mL UOP over 11/24.  Today has had 350 mL UOP.  Team is concerned that they will have to prone pt if oxygenation does not improve.  Review of systems  Unable to obtain 2/2 intubated    Objective:   BP (!) 164/84   Pulse (!) 101   Temp 97.9 F (36.6 C)   Resp (!) 32   Ht '5\' 6"'$  (1.676 m)   Wt 92.4 kg   SpO2 99%   BMI 32.88 kg/m   Intake/Output Summary (Last 24 hours) at 07/19/2019 1252 Last data filed at 07/19/2019 1200 Gross per 24 hour  Intake 4501.37 ml  Output 840 ml  Net 3661.37 ml   Weight change: 4.9 kg  Physical Exam: General adult male in bed intubated HEENT normocephalic atraumatic; eyes closed Neck trachea midline Lungs rhonchi bilaterally - PEEP 12 and FIO2 80  Heart tachycardic no rub  Abdomen softly distended Extremities 1-2+ edema  Neuro - sedated and paralyzed Access: RIJ nontunneled dialysis  catheter  Imaging: Dg Chest Port 1 View  Result Date: 07/19/2019 CLINICAL DATA:  Respiratory failure. EXAM: PORTABLE CHEST 1 VIEW COMPARISON:  July 18, 2019. FINDINGS: Stable cardiomediastinal silhouette. Endotracheal and feeding tubes are in good position. Right internal jugular catheter is noted. No pneumothorax is noted. Stable bilateral lung opacities are noted concerning for edema or pneumonia with bilateral pleural effusions. Bony thorax is unremarkable. IMPRESSION: Stable support apparatus. Stable bilateral lung opacities are noted concerning for edema or pneumonia with bilateral pleural effusions. Electronically Signed   By: Marijo Conception M.D.   On: 07/19/2019 07:47   Dg Chest Port 1 View  Result Date: 07/18/2019 CLINICAL DATA:  Respiratory failure, history hypertension, MI EXAM: PORTABLE CHEST 1 VIEW COMPARISON:  Portable exam 0544 hours compared to 07/17/2019 FINDINGS: Tip of endotracheal tube projects 6.7 cm above carina. Nasogastric tube coiled in stomach with tip projecting over fundus. RIGHT jugular line tip projecting over SVC. Stable heart size and mediastinal contours. BILATERAL airspace infiltrates, slightly increased in upper lobes consistent with multifocal pneumonia, increased. No pleural effusion or pneumothorax. Bones unremarkable. IMPRESSION: BILATERAL airspace infiltrates consistent with multifocal pneumonia, increased since 07/17/2019 Electronically Signed   By: Lavonia Dana M.D.   On: 07/18/2019 08:34   Dg Chest Port 1 View  Result Date: 07/17/2019 CLINICAL DATA:  Check endotracheal tube  placement EXAM: PORTABLE CHEST 1 VIEW COMPARISON:  Film from earlier in the same day. FINDINGS: Endotracheal tube and gastric catheter are noted in satisfactory position. Right jugular temporary dialysis catheter is seen. Patchy infiltrates are seen throughout both lungs predominately within the bases increased in the interval from the prior exam. These are consistent with the  patient's given clinical history of COVID-19 positivity. IMPRESSION: Increasing bibasilar infiltrates consistent with the given clinical history. Tubes and lines as described in satisfactory position. Electronically Signed   By: Inez Catalina M.D.   On: 07/17/2019 20:41   Dg Abd Portable 1v  Result Date: 07/18/2019 CLINICAL DATA:  Gastric tube position EXAM: PORTABLE ABDOMEN - 1 VIEW COMPARISON:  07/12/2019 FINDINGS: Feeding tube coiled in the stomach with the tip near the gastric antrum. Normal bowel gas pattern. Right femoral catheter overlies the right sacrum. Left lower lobe consolidation. IMPRESSION: Feeding tube in the gastric antrum.  Normal bowel gas pattern. Electronically Signed   By: Franchot Gallo M.D.   On: 07/18/2019 17:41    Labs: [employer] Recent Labs  Lab 07/14/19 0405 07/14/19 1700 07/15/19 0420 07/15/19 2020  07/16/19 0430 07/17/19 0417 07/17/19 1854 07/18/19 0455 07/18/19 0927 07/18/19 1432 07/18/19 2058 07/18/19 2102 07/19/19 0324  NA 152*  --  149* 144   < > 148*  150* 147* 145 143 143 138 140 137 138  K 5.0  --  4.8 4.5   < > 4.3  4.2 4.1 5.1 5.5* 5.2* 7.0* 6.0* 6.0* 5.6*  CL 119*  --  114* 113*  --  112*  112* 113*  --  107  --   --  107  --  104  CO2 22  --  24 23  --  '25  27 28  '$ --  26  --   --  24  --  24  GLUCOSE 235*  --  396* 397*  --  295*  302* 316*  --  334*  --   --  308*  --  312*  BUN 104*  --  124* 136*  --  138*  137* 132*  --  142*  --   --  157*  --  158*  CREATININE 2.47*  --  2.43* 2.47*  --  2.39*  2.39* 2.15*  --  2.91*  --   --  3.47*  --  3.59*  CALCIUM 7.7*  --  7.4* 7.2*  --  7.9*  8.0* 7.6*  --  8.0*  --   --  7.6*  --  7.8*  PHOS 4.3 5.5* 5.0*  --   --  4.1  4.0 4.1  --  7.4*  --   --   --   --  8.7*   < > = values in this interval not displayed.   CBC Recent Labs  Lab 07/15/19 0420  07/16/19 0430 07/17/19 0416  07/18/19 0455 07/18/19 0927 07/18/19 1432 07/18/19 2102 07/19/19 0324  WBC 6.7  --  9.1 7.3  --  9.4  --    --   --  10.7*  NEUTROABS 6.1  --  7.8* 6.4  --  8.3*  --   --   --   --   HGB 11.6*   < > 11.9* 10.3*   < > 10.7* 11.2* 11.6* 9.2* 10.0*  HCT 37.5*   < > 38.0* 32.9*   < > 35.3* 33.0* 34.0* 27.0* 31.9*  MCV 76.1*  --  75.4* 74.8*  --  77.2*  --   --   --  75.4*  PLT 294  --  349 281  --  276  --   --   --  303   < > = values in this interval not displayed.    Medications:    . artificial tears  1 application Both Eyes G7M  . ascorbic acid  250 mg Per Tube Daily  . aspirin  81 mg Per Tube Daily  . chlorhexidine gluconate (MEDLINE KIT)  15 mL Mouth Rinse BID  . Chlorhexidine Gluconate Cloth  6 each Topical Daily  . dexamethasone (DECADRON) injection  6 mg Intravenous Daily  . famotidine  20 mg Per Tube Q24H  . feeding supplement (PRO-STAT SUGAR FREE 64)  60 mL Per Tube TID  . feeding supplement (VITAL 1.5 CAL)  1,000 mL Per Tube Q24H  . free water  200 mL Per Tube Q8H  . heparin  7,500 Units Subcutaneous Q8H  . insulin aspart  0-20 Units Subcutaneous Q4H  . insulin detemir  30 Units Subcutaneous BID  . mouth rinse  15 mL Mouth Rinse 10 times per day  . pravastatin  10 mg Per Tube Daily  . sodium zirconium cyclosilicate  10 g Oral Daily  . zinc sulfate  220 mg Per Tube Daily   Claudia Desanctis 07/19/2019, 12:52 PM

## 2019-07-20 ENCOUNTER — Inpatient Hospital Stay (HOSPITAL_COMMUNITY): Payer: BC Managed Care – PPO

## 2019-07-20 DIAGNOSIS — J9601 Acute respiratory failure with hypoxia: Secondary | ICD-10-CM | POA: Diagnosis not present

## 2019-07-20 LAB — POCT I-STAT 7, (LYTES, BLD GAS, ICA,H+H)
Acid-base deficit: 1 mmol/L (ref 0.0–2.0)
Bicarbonate: 26.7 mmol/L (ref 20.0–28.0)
Calcium, Ion: 1.15 mmol/L (ref 1.15–1.40)
HCT: 30 % — ABNORMAL LOW (ref 39.0–52.0)
Hemoglobin: 10.2 g/dL — ABNORMAL LOW (ref 13.0–17.0)
O2 Saturation: 98 %
Patient temperature: 35.6
Potassium: 4.8 mmol/L (ref 3.5–5.1)
Sodium: 140 mmol/L (ref 135–145)
TCO2: 28 mmol/L (ref 22–32)
pCO2 arterial: 53.1 mmHg — ABNORMAL HIGH (ref 32.0–48.0)
pH, Arterial: 7.303 — ABNORMAL LOW (ref 7.350–7.450)
pO2, Arterial: 109 mmHg — ABNORMAL HIGH (ref 83.0–108.0)

## 2019-07-20 LAB — CBC
HCT: 28 % — ABNORMAL LOW (ref 39.0–52.0)
Hemoglobin: 8.9 g/dL — ABNORMAL LOW (ref 13.0–17.0)
MCH: 23.7 pg — ABNORMAL LOW (ref 26.0–34.0)
MCHC: 31.8 g/dL (ref 30.0–36.0)
MCV: 74.7 fL — ABNORMAL LOW (ref 80.0–100.0)
Platelets: 229 10*3/uL (ref 150–400)
RBC: 3.75 MIL/uL — ABNORMAL LOW (ref 4.22–5.81)
RDW: 17.8 % — ABNORMAL HIGH (ref 11.5–15.5)
WBC: 4.7 10*3/uL (ref 4.0–10.5)
nRBC: 0 % (ref 0.0–0.2)

## 2019-07-20 LAB — MAGNESIUM: Magnesium: 2.7 mg/dL — ABNORMAL HIGH (ref 1.7–2.4)

## 2019-07-20 LAB — IRON AND TIBC
Iron: 130 ug/dL (ref 45–182)
Saturation Ratios: 76 % — ABNORMAL HIGH (ref 17.9–39.5)
TIBC: 171 ug/dL — ABNORMAL LOW (ref 250–450)
UIBC: 41 ug/dL

## 2019-07-20 LAB — RENAL FUNCTION PANEL
Albumin: 1.4 g/dL — ABNORMAL LOW (ref 3.5–5.0)
Albumin: 1.5 g/dL — ABNORMAL LOW (ref 3.5–5.0)
Anion gap: 8 (ref 5–15)
Anion gap: 6 (ref 5–15)
BUN: 111 mg/dL — ABNORMAL HIGH (ref 8–23)
BUN: 74 mg/dL — ABNORMAL HIGH (ref 8–23)
CO2: 26 mmol/L (ref 22–32)
CO2: 26 mmol/L (ref 22–32)
Calcium: 7.7 mg/dL — ABNORMAL LOW (ref 8.9–10.3)
Calcium: 7.6 mg/dL — ABNORMAL LOW (ref 8.9–10.3)
Chloride: 106 mmol/L (ref 98–111)
Chloride: 104 mmol/L (ref 98–111)
Creatinine, Ser: 2.28 mg/dL — ABNORMAL HIGH (ref 0.61–1.24)
Creatinine, Ser: 1.68 mg/dL — ABNORMAL HIGH (ref 0.61–1.24)
GFR calc Af Amer: 34 mL/min — ABNORMAL LOW (ref >60)
GFR calc Af Amer: 49 mL/min — ABNORMAL LOW (ref >60)
GFR calc non Af Amer: 29 mL/min — ABNORMAL LOW (ref >60)
GFR calc non Af Amer: 42 mL/min — ABNORMAL LOW (ref >60)
Glucose, Bld: 200 mg/dL — ABNORMAL HIGH (ref 70–99)
Glucose, Bld: 202 mg/dL — ABNORMAL HIGH (ref 70–99)
Phosphorus: 4.3 mg/dL (ref 2.5–4.6)
Phosphorus: 4.2 mg/dL (ref 2.5–4.6)
Potassium: 4.7 mmol/L (ref 3.5–5.1)
Potassium: 5.5 mmol/L — ABNORMAL HIGH (ref 3.5–5.1)
Sodium: 140 mmol/L (ref 135–145)
Sodium: 136 mmol/L (ref 135–145)

## 2019-07-20 LAB — GLUCOSE, CAPILLARY
Glucose-Capillary: 165 mg/dL — ABNORMAL HIGH (ref 70–99)
Glucose-Capillary: 168 mg/dL — ABNORMAL HIGH (ref 70–99)
Glucose-Capillary: 171 mg/dL — ABNORMAL HIGH (ref 70–99)
Glucose-Capillary: 179 mg/dL — ABNORMAL HIGH (ref 70–99)
Glucose-Capillary: 184 mg/dL — ABNORMAL HIGH (ref 70–99)
Glucose-Capillary: 191 mg/dL — ABNORMAL HIGH (ref 70–99)

## 2019-07-20 LAB — FERRITIN: Ferritin: 1544 ng/mL — ABNORMAL HIGH (ref 24–336)

## 2019-07-20 MED ORDER — PRISMASOL BGK 0/2.5 32-2.5 MEQ/L REPLACEMENT SOLN
Status: DC
  Administered 2019-07-20 – 2019-07-21 (×2): via INTRAVENOUS_CENTRAL
  Filled 2019-07-20 (×9): qty 5000

## 2019-07-20 NOTE — Progress Notes (Signed)
[facility] Progress Note Patient Name: Craig Mccoy DOB: [birth year] MRN: [id number]   Date of Service  07/20/2019  HPI/Events of Note  Pt has watery stools.  eICU Interventions  Flexiseal ordered.        Kerry Kass Shondell Fabel 07/20/2019, 12:17 AM

## 2019-07-20 NOTE — Progress Notes (Addendum)
Failed paralytic wean, dyssynchronous and desaturating despite multiple modality changes.  FiO2 increased to 0.8.  Called daughter and updated as well as got permission for pigtail catheters if needed. Santorelli' mother just passed yesterday at [facility].  Erskine Emery MD PCCM

## 2019-07-20 NOTE — Progress Notes (Signed)
NAME:  Stefano Trulson, MRN:  [id number], DOB:  [birth year], LOS: 9 ADMISSION DATE:  07/16/2019, CONSULTATION DATE:  11/17 REFERRING MD:  Rogene Houston, CHIEF COMPLAINT:  Acute respiratory failure   Brief History   dekota shenk is a 63 yo male, COVID positive, who was transferred [facility]  for iHD.   History of present illness   Henson Harrisis a 63 y.o.malewho has a PMH including but not limited to hypertension and prior MI per chart review (see "past medical history" for rest). Hepresented to [name] 11/17 with dyspnea and respiratory distress. He had apparently tested positive for COVID on 11/12 at an outside institution and had been in self isolation. His "lady friend" called EMS 11/17 due to above symptoms.  On EMS arrival, SpO2 was low despite supplemental O2. In ED, it was 77% before being placed on 15L NRB with improvement to 93%. Unfortunately, he deteriorated and required intubation. Repeat COVID testing returned as positive. He was also found to have multiple metabolic derangements including marked renal failure (SCr 14.63 with BUN 219), met acidosis + resp alkalosis (7.28, <19, 72), PCT 25.  Due to his potential to require iHD, he was transferred to [facility] for further evaluation and management.   Past Medical History  MI, HTN  Significant Hospital Events   11/17 - transferred to [facility] from [facility]  11/19 - possible seizure like activity witnessed by RN 11/25 CRRT, proning  Consults:  Nephrology neurology  Procedures:  ETT 11/17.  Echo - 11/18 cEEG 11/20 - 11/22 - diffuse encephalopathy. No seizures.   Significant Diagnostic Tests:  CXR - 11/17 - bilateral opacities CXR - 11/25 - consistent b/l opacities concerning for edema or PNA w/ b/l pleural effusions.  HS-trop - 1604>>1504 Ferritin 2490>>>890 CRP - 38>>9.8>21.8 PCT - 25.01 D-Dimer 2.52>>>1.34; Fibrinogen > 800.  PT -16.4; INR 1.3. PTT 29.  Echo - 35% LVEF. Mild LVH. basl akinesis. G1DD CT head - no acute  abnormality MRI brain 11/21 No acute abnormality  Micro Data:  Blood Cx - 11/17 - NG4D  Respiratory Cx 11/17 - moraxella catarrhalis Urine Cx 11/17 - no growth MRSA pcr - negative COVID 11/17 - positive  Antimicrobials:  CTX - 11/18 x 5 days Azithro - complete  Interim history/subjective:  Sedated, paralyzed on vent. CXR today personally reviewed, stable bilateral ARDS vs. Pulmonary edema pattern  Objective   Blood pressure 103/61, pulse 66, temperature (!) 96.6 F (35.9 C), resp. rate (!) 32, height _0  (1.676 m), weight 92.7 kg, SpO2 100 %.    Vent Mode: PRVC FiO2 (%):  [80 %] 80 % Set Rate:  [32 bmp] 32 bmp Vt Set:  [380 mL] 380 mL PEEP:  [12 cmH20] 12 cmH20 Plateau Pressure:  [29 cmH20-33 cmH20] 32 cmH20   Intake/Output Summary (Last 24 hours) at 07/20/2019 9373 Last data filed at 07/20/2019 0500 Gross per 24 hour  Intake 2744.91 ml  Output 1694 ml  Net 1050.91 ml   Filed Weights   07/18/19 0230 07/19/19 0217 07/20/19 0408  Weight: 87.5 kg 92.4 kg 92.7 kg    Examination: GEN: intubated sedated man on vent HEENT: ETT in place, thick purulent secretions CV: RRR, ext warm PULM: Poor air movement, passive on vent GI: Soft, +BS EXT: 1-2+ pedal edema NEURO: Paralyzed PSYCH: RASS -1 SKIN: No rashes   Resolved Hospital Problem list    BMET looks okay Low albumin CBC stable Assessment & Plan:    # Acute respiratory failure with hypoxia requiring  mechanical ventilation due to COVID-19, m catarrhalis PNA -  s/p azithro (11/18-11/21) and CTX (11/17 - 11/21).   Also getting remdesivir, actemra x 1, steroids. # Acute encephalopathy - one episode of seizure like activity on 11/18. No further episodes seen. EEG did not show epileptiform activity, but did show diffuse encephalopathy. Neurology following, recommends holding anti-epileptics for now. MRI brain 11/21 No acute abnormality. etiology CNS depressing meds vs hypoactive delirium vs COVID-related  encephalopathy  # Shock- timing correlates with sedation # Acute heart failure- some combination COVID myocarditis, shock cardiomyopathy. # Acute renal failure- related to acute illness, question of cardiorenal syndrome # Hyperglycemia- levemir 30 units BID, SSI  - Wean FiO2 down to 60% - Try to pull fluid as able - May take a look with [place] to see if enough fluid for therapeutic tap - Steroids, remdesivir fine to complete courses as ordered - Check AM D dimer, low threshold for full AC - Suspect pressor need will resolve after we wean sedation - Wean off benzodiazepines and paralytics today if he tolerates lower FiO2  Best practice:  Diet: NPO Pain/Anxiety/Delirium protocol (if indicated): PAD protocol VAP protocol (if indicated): in place DVT prophylaxis: subQ heparin GI prophylaxis: pepcid Glucose control: levemir w/ rSSI Mobility: Bedrest Code Status: full Family Communication: Will reach out Disposition: ICU, unclear what GVC bed situation is  35 minutes CC time Erskine Emery MD PCCM

## 2019-07-20 NOTE — Plan of Care (Signed)
  Problem: Clinical Measurements: Goal: Respiratory complications will improve Outcome: Progressing Note: Patients FiO2 was decreased to 60% this morning. Pt is currently tolerating well with oxygen saturations in the mid 90s.   Problem: Nutrition: Goal: Adequate nutrition will be maintained Outcome: Progressing Note: Pt is tolerating TF well at goal.   Problem: Elimination: Goal: Will not experience complications related to bowel motility Outcome: Progressing Note: Last BM 11/26    Problem: Pain Managment: Goal: General experience of comfort will improve Outcome: Progressing   Problem: Respiratory: Goal: Will maintain a patent airway Outcome: Progressing   Problem: Activity: Goal: Risk for activity intolerance will decrease Outcome: Not Progressing Note: Pt paralyzed and sedated.

## 2019-07-20 NOTE — Procedures (Signed)
[place] Chest real time Good lung sliding bilaterally Trivial effusions not large enough to drain  Erskine Emery MD PCCM

## 2019-07-20 NOTE — Progress Notes (Signed)
[facility] Progress Note    Assessment/ Plan:   1. AKI - secondary to sepsis + COVID now with ATN and hypotension.  Initially required a short run of CRRT 11/17-11/18. Now with worsening renal function.  Spoke with his son, Kasandra Knudsen [phone] who does agree with proceed with CRRT. Restarted CRRT on 11/25 (note nontunned line was still in place) - Continue CRRT; increase goal UF net negative 100 cc/hr as tolerated   - would transition to nepro for feeds   2. COVID PNA s/p Remdesivir; decadron per PCCM  3. Acute hypoxic respiratory failure on mechanical ventilation per pulm; optimize volume status with CRRT  4. Hyperkalemia: with AKI - resolved on CRRT   5. Septic shock:  Therapies per primary team; on levo on and off    6. Hyperphos - resolving with CRRT   7. Anemia - multifactorial - hx AKI.  No acute indication for PRBC's.  Trending down. Check iron stores   8. Hypernatremia: resolved.  Discontinued additional free water for now and monitor for need to restart    Subjective:    On CRRT - procedure supervised.  700 mL UOP over 11/25 as charted.  He had 1.1 liters UF with CRRT over 11/25.  On and off of levo overnight.  Was proned yesterday at one point.   Review of systems  Unable to obtain 2/2 intubated    Objective:   BP 103/61   Pulse 66   Temp (!) 96.6 F (35.9 C)   Resp (!) 32   Ht 5' 6" (1.676 m)   Wt 92.7 kg   SpO2 90%   BMI 32.99 kg/m   Intake/Output Summary (Last 24 hours) at 07/20/2019 0650 Last data filed at 07/20/2019 0600 Gross per 24 hour  Intake 2744.91 ml  Output 1787 ml  Net 957.91 ml   Weight change: 0.3 kg  Physical Exam: last examined in person on 11/25 with appropriate PPE.  On 11/26 discussed with nursing Due to the nature of this patient's suspected COVID-19 with isolation and in keeping with efforts to prevent the spread of infection and to conserve personal protective equipment, a physical exam was not personally  performed.  Patient's symptoms and exam were discussed in detail with the RN.  A chart review of other providers notes and the patient's lab work as well as review of other pertinent studies was performed.  Exam details from prior documentation were reviewed specifically and confirmed with the bedside nurse.  Location of service: [place] adult male in bed intubated  HEENT normocephalic atraumatic; eyes closed Neck trachea midline Lungs rhonchi bilaterally per nursing - PEEP 12 and FIO2 60 Heart RRR - HR 77 on exam  Abdomen softly distended GU scrotal edema Extremities 2+ edema  Neuro - sedated and paralyzed Access: RIJ nontunneled dialysis catheter  Imaging: Dg Chest Port 1 View  Result Date: 07/19/2019 CLINICAL DATA:  Respiratory failure. EXAM: PORTABLE CHEST 1 VIEW COMPARISON:  July 18, 2019. FINDINGS: Stable cardiomediastinal silhouette. Endotracheal and feeding tubes are in good position. Right internal jugular catheter is noted. No pneumothorax is noted. Stable bilateral lung opacities are noted concerning for edema or pneumonia with bilateral pleural effusions. Bony thorax is unremarkable. IMPRESSION: Stable support apparatus. Stable bilateral lung opacities are noted concerning for edema or pneumonia with bilateral pleural effusions. Electronically Signed   By: Marijo Conception M.D.   On: 07/19/2019 07:47   Dg Abd Portable 1v  Result Date:  07/18/2019 CLINICAL DATA:  Gastric tube position EXAM: PORTABLE ABDOMEN - 1 VIEW COMPARISON:  07/12/2019 FINDINGS: Feeding tube coiled in the stomach with the tip near the gastric antrum. Normal bowel gas pattern. Right femoral catheter overlies the right sacrum. Left lower lobe consolidation. IMPRESSION: Feeding tube in the gastric antrum.  Normal bowel gas pattern. Electronically Signed   By: Franchot Gallo M.D.   On: 07/18/2019 17:41    Labs: BMET Recent Labs  Lab 07/15/19 0420  07/16/19 0430 07/17/19 0417   07/18/19 0455  07/18/19 2058 07/18/19 2102 07/19/19 0324 07/19/19 1545 07/19/19 1710 07/20/19 0311 07/20/19 0401  NA 149*   < > 148*  150* 147*   < > 143   < > 140 137 138 139 140 140 140  K 4.8   < > 4.3  4.2 4.1   < > 5.5*   < > 6.0* 6.0* 5.6* 5.5* 5.5* 4.8 4.7  CL 114*   < > 112*  112* 113*  --  107  --  107  --  104  --  108  --  106  CO2 24   < > _0 --  26  --  24  --  24  --  24  --  26  GLUCOSE 396*   < > 295*  302* 316*  --  334*  --  308*  --  312*  --  297*  --  200*  BUN 124*   < > 138*  137* 132*  --  142*  --  157*  --  158*  --  158*  --  111*  CREATININE 2.43*   < > 2.39*  2.39* 2.15*  --  2.91*  --  3.47*  --  3.59*  --  3.29*  --  2.28*  CALCIUM 7.4*   < > 7.9*  8.0* 7.6*  --  8.0*  --  7.6*  --  7.8*  --  7.7*  --  7.7*  PHOS 5.0*  --  4.1  4.0 4.1  --  7.4*  --   --   --  8.7*  --  7.2*  --  4.3   < > = values in this interval not displayed.   CBC Recent Labs  Lab 07/15/19 0420  07/16/19 0430 07/17/19 0416  07/18/19 0455  07/19/19 0324 07/19/19 1545 07/20/19 0311 07/20/19 0401  WBC 6.7  --  9.1 7.3  --  9.4  --  10.7*  --   --  4.7  NEUTROABS 6.1  --  7.8* 6.4  --  8.3*  --   --   --   --   --   HGB 11.6*   < > 11.9* 10.3*   < > 10.7*   < > 10.0* 9.5* 10.2* 8.9*  HCT 37.5*   < > 38.0* 32.9*   < > 35.3*   < > 31.9* 28.0* 30.0* 28.0*  MCV 76.1*  --  75.4* 74.8*  --  77.2*  --  75.4*  --   --  74.7*  PLT 294  --  349 281  --  276  --  303  --   --  229   < > = values in this interval not displayed.    Medications:    . artificial tears  1 application Both Eyes Z6X  . ascorbic acid  250 mg Per Tube Daily  . aspirin  81 mg Per Tube  Daily  . chlorhexidine gluconate (MEDLINE KIT)  15 mL Mouth Rinse BID  . Chlorhexidine Gluconate Cloth  6 each Topical Daily  . dexamethasone (DECADRON) injection  6 mg Intravenous Daily  . famotidine  20 mg Per Tube Q24H  . feeding supplement (PRO-STAT SUGAR FREE 64)  60 mL Per Tube TID  . feeding supplement  (VITAL 1.5 CAL)  1,000 mL Per Tube Q24H  . heparin  7,500 Units Subcutaneous Q8H  . insulin aspart  0-20 Units Subcutaneous Q4H  . insulin detemir  30 Units Subcutaneous BID  . mouth rinse  15 mL Mouth Rinse 10 times per day  . pravastatin  10 mg Per Tube Daily  . zinc sulfate  220 mg Per Tube Daily   Claudia Desanctis 07/20/2019, 6:50 AM

## 2019-07-20 NOTE — Progress Notes (Addendum)
Pt had 23 beat run off V Tach. Pt oxygen saturations dropped to 87%. Dr. Tamala Julian made aware.

## 2019-07-21 DIAGNOSIS — N179 Acute kidney failure, unspecified: Secondary | ICD-10-CM | POA: Diagnosis not present

## 2019-07-21 DIAGNOSIS — J9811 Atelectasis: Secondary | ICD-10-CM

## 2019-07-21 DIAGNOSIS — J1289 Other viral pneumonia: Secondary | ICD-10-CM | POA: Diagnosis not present

## 2019-07-21 DIAGNOSIS — U071 COVID-19: Secondary | ICD-10-CM | POA: Diagnosis not present

## 2019-07-21 LAB — RENAL FUNCTION PANEL
Albumin: 1.6 g/dL — ABNORMAL LOW (ref 3.5–5.0)
Albumin: 1.8 g/dL — ABNORMAL LOW (ref 3.5–5.0)
Anion gap: 7 (ref 5–15)
Anion gap: 8 (ref 5–15)
BUN: 60 mg/dL — ABNORMAL HIGH (ref 8–23)
BUN: 53 mg/dL — ABNORMAL HIGH (ref 8–23)
CO2: 27 mmol/L (ref 22–32)
CO2: 25 mmol/L (ref 22–32)
Calcium: 7.9 mg/dL — ABNORMAL LOW (ref 8.9–10.3)
Calcium: 7.9 mg/dL — ABNORMAL LOW (ref 8.9–10.3)
Chloride: 105 mmol/L (ref 98–111)
Chloride: 104 mmol/L (ref 98–111)
Creatinine, Ser: 1.59 mg/dL — ABNORMAL HIGH (ref 0.61–1.24)
Creatinine, Ser: 1.47 mg/dL — ABNORMAL HIGH (ref 0.61–1.24)
GFR calc Af Amer: 52 mL/min — ABNORMAL LOW (ref >60)
GFR calc Af Amer: 58 mL/min — ABNORMAL LOW (ref >60)
GFR calc non Af Amer: 45 mL/min — ABNORMAL LOW (ref >60)
GFR calc non Af Amer: 50 mL/min — ABNORMAL LOW (ref >60)
Glucose, Bld: 163 mg/dL — ABNORMAL HIGH (ref 70–99)
Glucose, Bld: 273 mg/dL — ABNORMAL HIGH (ref 70–99)
Phosphorus: 3.6 mg/dL (ref 2.5–4.6)
Phosphorus: 3.1 mg/dL (ref 2.5–4.6)
Potassium: 5.2 mmol/L — ABNORMAL HIGH (ref 3.5–5.1)
Potassium: 5.4 mmol/L — ABNORMAL HIGH (ref 3.5–5.1)
Sodium: 139 mmol/L (ref 135–145)
Sodium: 137 mmol/L (ref 135–145)

## 2019-07-21 LAB — POCT I-STAT 7, (LYTES, BLD GAS, ICA,H+H)
Acid-base deficit: 1 mmol/L (ref 0.0–2.0)
Bicarbonate: 27.3 mmol/L (ref 20.0–28.0)
Calcium, Ion: 1.17 mmol/L (ref 1.15–1.40)
HCT: 30 % — ABNORMAL LOW (ref 39.0–52.0)
Hemoglobin: 10.2 g/dL — ABNORMAL LOW (ref 13.0–17.0)
O2 Saturation: 96 %
Patient temperature: 32
Potassium: 4.6 mmol/L (ref 3.5–5.1)
Sodium: 137 mmol/L (ref 135–145)
TCO2: 29 mmol/L (ref 22–32)
pCO2 arterial: 49.5 mmHg — ABNORMAL HIGH (ref 32.0–48.0)
pH, Arterial: 7.322 — ABNORMAL LOW (ref 7.350–7.450)
pO2, Arterial: 75 mmHg — ABNORMAL LOW (ref 83.0–108.0)

## 2019-07-21 LAB — CBC
HCT: 31.7 % — ABNORMAL LOW (ref 39.0–52.0)
Hemoglobin: 9.7 g/dL — ABNORMAL LOW (ref 13.0–17.0)
MCH: 23.3 pg — ABNORMAL LOW (ref 26.0–34.0)
MCHC: 30.6 g/dL (ref 30.0–36.0)
MCV: 76 fL — ABNORMAL LOW (ref 80.0–100.0)
Platelets: 203 10*3/uL (ref 150–400)
RBC: 4.17 MIL/uL — ABNORMAL LOW (ref 4.22–5.81)
RDW: 18.1 % — ABNORMAL HIGH (ref 11.5–15.5)
WBC: 4.4 10*3/uL (ref 4.0–10.5)
nRBC: 0 % (ref 0.0–0.2)

## 2019-07-21 LAB — HEPATIC FUNCTION PANEL
ALT: 97 U/L — ABNORMAL HIGH (ref 0–44)
AST: 73 U/L — ABNORMAL HIGH (ref 15–41)
Albumin: 1.6 g/dL — ABNORMAL LOW (ref 3.5–5.0)
Alkaline Phosphatase: 116 U/L (ref 38–126)
Bilirubin, Direct: <0.1 mg/dL (ref 0.0–0.2)
Total Bilirubin: 0.3 mg/dL (ref 0.3–1.2)
Total Protein: 5.3 g/dL — ABNORMAL LOW (ref 6.5–8.1)

## 2019-07-21 LAB — MAGNESIUM: Magnesium: 2.6 mg/dL — ABNORMAL HIGH (ref 1.7–2.4)

## 2019-07-21 LAB — GLUCOSE, CAPILLARY
Glucose-Capillary: 136 mg/dL — ABNORMAL HIGH (ref 70–99)
Glucose-Capillary: 144 mg/dL — ABNORMAL HIGH (ref 70–99)
Glucose-Capillary: 162 mg/dL — ABNORMAL HIGH (ref 70–99)
Glucose-Capillary: 219 mg/dL — ABNORMAL HIGH (ref 70–99)
Glucose-Capillary: 243 mg/dL — ABNORMAL HIGH (ref 70–99)
Glucose-Capillary: 256 mg/dL — ABNORMAL HIGH (ref 70–99)

## 2019-07-21 LAB — D-DIMER, QUANTITATIVE: D-Dimer, Quant: 1.8 ug/mL-FEU — ABNORMAL HIGH (ref 0.00–0.50)

## 2019-07-21 MED ORDER — SODIUM CHLORIDE 0.9 % IV SOLN
0.5000 mg/h | INTRAVENOUS | Status: DC
  Administered 2019-07-21: 3 mg/h via INTRAVENOUS
  Administered 2019-07-21: 4 mg/h via INTRAVENOUS
  Administered 2019-07-22 – 2019-07-24 (×2): 3 mg/h via INTRAVENOUS
  Administered 2019-07-26 – 2019-07-28 (×2): 4 mg/h via INTRAVENOUS
  Filled 2019-07-21 (×11): qty 5

## 2019-07-21 MED ORDER — PRISMASOL BGK 4/2.5 32-4-2.5 MEQ/L REPLACEMENT SOLN
Status: DC
  Administered 2019-07-21 – 2019-07-27 (×8): via INTRAVENOUS_CENTRAL
  Filled 2019-07-21 (×11): qty 5000

## 2019-07-21 MED ORDER — HYDROMORPHONE HCL 1 MG/ML IJ SOLN: 1.0000 mg | Freq: Once | INTRAMUSCULAR | Status: DC

## 2019-07-21 MED ORDER — SODIUM CHLORIDE 0.9% FLUSH
10.0000 mL | Freq: Two times a day (BID) | INTRAVENOUS | Status: DC
  Administered 2019-07-21: 20 mL
  Administered 2019-07-22 – 2019-07-28 (×9): 10 mL

## 2019-07-21 MED ORDER — LABETALOL HCL 5 MG/ML IV SOLN
10.0000 mg | INTRAVENOUS | Status: DC | PRN
  Administered 2019-07-21 – 2019-07-22 (×2): 10 mg via INTRAVENOUS
  Filled 2019-07-21 (×3): qty 4

## 2019-07-21 MED ORDER — ALBUTEROL SULFATE (2.5 MG/3ML) 0.083% IN NEBU
INHALATION_SOLUTION | RESPIRATORY_TRACT | Status: AC
  Administered 2019-07-21: 2.5 mg
  Filled 2019-07-21: qty 3

## 2019-07-21 MED ORDER — ALBUTEROL SULFATE (2.5 MG/3ML) 0.083% IN NEBU
2.5000 mg | INHALATION_SOLUTION | RESPIRATORY_TRACT | Status: DC | PRN
  Administered 2019-07-22: 2.5 mg via RESPIRATORY_TRACT
  Filled 2019-07-21 (×2): qty 3

## 2019-07-21 MED ORDER — ACETYLCYSTEINE 20 % IN SOLN
4.0000 mL | Freq: Three times a day (TID) | RESPIRATORY_TRACT | Status: AC
  Administered 2019-07-21 – 2019-07-22 (×6): 4 mL via RESPIRATORY_TRACT
  Filled 2019-07-21 (×6): qty 4

## 2019-07-21 MED ORDER — CHLORHEXIDINE GLUCONATE CLOTH 2 % EX PADS
6.0000 | MEDICATED_PAD | Freq: Every day | CUTANEOUS | Status: DC
  Administered 2019-07-22 – 2019-07-28 (×7): 6 via TOPICAL

## 2019-07-21 MED ORDER — ACETYLCYSTEINE 20 % IN SOLN
4.0000 mL | Freq: Once | RESPIRATORY_TRACT | Status: DC
  Filled 2019-07-21 (×2): qty 4

## 2019-07-21 MED ORDER — HYDROMORPHONE BOLUS VIA INFUSION
0.5000 mg | INTRAVENOUS | Status: DC | PRN
  Administered 2019-07-26: 0.5 mg via INTRAVENOUS
  Filled 2019-07-21: qty 1

## 2019-07-21 MED ORDER — PRISMASOL BGK 0/2.5 32-2.5 MEQ/L IV SOLN
INTRAVENOUS | Status: DC
  Administered 2019-07-21 – 2019-07-22 (×5): via INTRAVENOUS_CENTRAL
  Filled 2019-07-21 (×12): qty 5000

## 2019-07-21 MED ORDER — CHLORHEXIDINE GLUCONATE 0.12 % MT SOLN
OROMUCOSAL | Status: AC
  Administered 2019-07-21: 21:00:00 15 mL
  Filled 2019-07-21: qty 15

## 2019-07-21 MED ORDER — SODIUM CHLORIDE 0.9 % IV SOLN
0.5000 mg/kg/h | INTRAVENOUS | Status: DC
  Administered 2019-07-21 – 2019-07-24 (×9): 0.5 mg/kg/h via INTRAVENOUS
  Administered 2019-07-25: 0.1 mg/kg/h via INTRAVENOUS
  Filled 2019-07-21 (×13): qty 5

## 2019-07-21 MED ORDER — SODIUM CHLORIDE 0.9% FLUSH: 10.0000 mL | INTRAVENOUS | Status: DC | PRN

## 2019-07-21 NOTE — Progress Notes (Signed)
Called and updated family.  

## 2019-07-21 NOTE — Procedures (Signed)
Bronchoscopy  Indication: Absent breath sounds on left, desaturations  Consent: Emergent  Anesthesia: Already in place  Procedure - Timeout performed - Bronchoscope advanced through ETT - Airways examined down to subsegmental level - Following airway examination, therapeutic suctioning of left bronchial tree performed  Findings - Mucus plug occluding left mainstem - Extensive mucus plugs blocking left upper and lower lobes suctioned - Mild diffuse airway friability, worse on L  Post procedure, improved breath sounds on left and TV on vent Left in PC with FiO2 1.0  Specimen(s): None  Complications: None immediate

## 2019-07-21 NOTE — Progress Notes (Signed)
RT note: RT assisted DR Tamala Julian bedside bronchoscopy do to desats and decreased VT on ventilator.

## 2019-07-21 NOTE — Progress Notes (Signed)
NAME:  Laurier Jasperson, MRN:  [id number], DOB:  05-May-1956, LOS: 10 ADMISSION DATE:  07/12/2019, CONSULTATION DATE:  11/17 REFERRING MD:  Rogene Houston, CHIEF COMPLAINT:  Acute respiratory failure   Brief History   ayham word is a 63 yo male, COVID positive, who was transferred from [facility] to [facility] for iHD.   History of present illness   Paco Harrisis a 63 y.o.malewho has a PMH including but not limited to hypertension and prior MI per chart review (see "past medical history" for rest). Hepresented to [name] 11/17 with dyspnea and respiratory distress. He had apparently tested positive for COVID on 11/12 at an outside institution and had been in self isolation. His "lady friend" called EMS 11/17 due to above symptoms.  On EMS arrival, SpO2 was low despite supplemental O2. In ED, it was 77% before being placed on 15L NRB with improvement to 93%. Unfortunately, he deteriorated and required intubation. Repeat COVID testing returned as positive. He was also found to have multiple metabolic derangements including marked renal failure (SCr 14.63 with BUN 219), met acidosis + resp alkalosis (7.28, <19, 72), PCT 25.  Due to his potential to require iHD, he was transferred to [facility] for further evaluation and management.   Past Medical History  MI, HTN  Significant Hospital Events   11/17 - transferred to [facility] from [facility]  11/19 - possible seizure like activity witnessed by RN 11/25 CRRT, proning 11/26 >> O2 wean and fluid pull  Consults:  Nephrology neurology  Procedures:  ETT 11/17.  Echo - 11/18 cEEG 11/20 - 11/22 - diffuse encephalopathy. No seizures.   Significant Diagnostic Tests:  CXR - 11/17 - bilateral opacities CXR - 11/25 - consistent b/l opacities concerning for edema or PNA w/ b/l pleural effusions.  HS-trop - 1604>>1504 Ferritin 2490>>>890 CRP - 38>>9.8>21.8 PCT - 25.01 D-Dimer 2.52>>>1.34; Fibrinogen > 800.  PT -16.4; INR 1.3. PTT 29.  Echo - 35% LVEF. Mild LVH. basl  akinesis. G1DD CT head - no acute abnormality MRI brain 11/21 No acute abnormality  Micro Data:  Blood Cx - 11/17 - NG4D  Respiratory Cx 11/17 - moraxella catarrhalis Urine Cx 11/17 - no growth MRSA pcr - negative COVID 11/17 - positive  Antimicrobials:  CTX - 11/18 x 5 days Azithro - complete  Interim history/subjective:  Sedated, paralyzed on vent. Had short run of VT this AM.  Objective   Blood pressure 107/63, pulse 74, temperature (!) 97.2 F (36.2 C), resp. rate (!) 32, height _0  (1.676 m), weight 93.1 kg, SpO2 100 %.    Vent Mode: PRVC FiO2 (%):  [60 %-80 %] 60 % Set Rate:  [32 bmp] 32 bmp Vt Set:  [380 mL] 380 mL PEEP:  [12 cmH20] 12 cmH20 Plateau Pressure:  [32 cmH20-38 cmH20] 38 cmH20   Intake/Output Summary (Last 24 hours) at 07/21/2019 0751 Last data filed at 07/21/2019 0700 Gross per 24 hour  Intake 2795.69 ml  Output 4728 ml  Net -1932.31 ml   Filed Weights   07/19/19 0217 07/20/19 0408 07/21/19 0400  Weight: 92.4 kg 92.7 kg 93.1 kg    Examination: GEN: intubated sedated man on vent HEENT: ETT in place, whitish secretions CV: RRR, ext warm PULM: Poor air movement, passive on vent GI: Soft, +BS EXT: diffuse anasarca persists NEURO: Paralyzed PSYCH: Paralyzed SKIN: No rashes  BMET looks okay Low albumin persists CBC stable D dimer essentially unchanged Sugars look good   Resolved Hospital Problem list     Assessment &  Plan:    # Acute respiratory failure with hypoxia requiring mechanical ventilation due to COVID-19, m catarrhalis PNA -  s/p azithro (11/18-11/21) and CTX (11/17 - 11/21).   Also getting remdesivir, actemra x 1, steroids. # Acute encephalopathy - one episode of seizure like activity on 11/18. No further episodes seen. EEG did not show epileptiform activity, but did show diffuse encephalopathy. Neurology following, recommends holding anti-epileptics for now. MRI brain 11/21 No acute abnormality. etiology CNS depressing meds  vs hypoactive delirium vs COVID-related encephalopathy  # Acute heart failure- some combination COVID myocarditis, shock cardiomyopathy. # Acute renal failure- related to acute illness, question of cardiorenal syndrome # Hyperglycemia- currently under control  - Continue to try to wean FiO2, down to 0.5 today but sats are 89-90% range - Check ABG - Continue to try to pull fluid as able, appreciate nephrology assstance - Steroids, remdesivir complete courses as ordered - Follow D dimer, low threshold for full AC - Guarded prognosis  Best practice:  Diet: TF Pain/Anxiety/Delirium protocol (if indicated): PAD protocol VAP protocol (if indicated): in place DVT prophylaxis: subQ heparin GI prophylaxis: pepcid Glucose control: levemir w/ rSSI Mobility: Bedrest Code Status: full Family Communication: Will reach out Disposition: ICU  33 minutes CC time  Erskine Emery MD PCCM

## 2019-07-21 NOTE — Progress Notes (Signed)
[facility] Progress Note    Assessment/ Plan:   1. AKI - secondary to sepsis + COVID now with ATN and hypotension.  Initially required a short run of CRRT 11/17-11/18. Now with worsening renal function.  Spoke with his son, Kasandra Knudsen [phone] who does agree with proceed with CRRT. Restarted CRRT on 11/25 (note nontunned line was still in place) - Continue CRRT; increase goal UF net negative 125 to 150 cc/hr as tolerated.  Follow PM labs to determine if ok for 4K fluids post-filter again - would transition to nepro for feeds    2. COVID PNA s/p Remdesivir; decadron per PCCM  3. Acute hypoxic respiratory failure on mechanical ventilation per pulm; optimize volume status with CRRT   4. Hyperkalemia: with AKI - on CRRT   5. Septic shock:  Therapies per primary team; on levo on and off     6. Hyperphos - resolved with CRRT   7. Anemia - multifactorial - hx AKI.  No acute indication for PRBC's.  Trending down. iron stores are replete   8. Hypernatremia: resolved.  Discontinued additional free water for now and monitor for need to restart    Subjective:    Seen on CRRT - procedure supervised.  700 mL UOP over 11/26 as charted thus far.  He had 3.9 liters UF with CRRT over 11/26.  Post-filter fluids were changed to 0K on 11/26 PM.  No issues with clotting.  Has been on [place] heparin.  Failed paralytic wean yesterday.  On and off of levo  Review of systems  Unable to obtain 2/2 intubated    Objective:   BP 129/71   Pulse 82   Temp (!) 96.8 F (36 C)   Resp (!) 32   Ht '5\' 6"'$  (1.676 m)   Wt 93.1 kg   SpO2 98%   BMI 33.13 kg/m   Intake/Output Summary (Last 24 hours) at 07/21/2019 4665 Last data filed at 07/21/2019 0600 Gross per 24 hour  Intake 2797.19 ml  Output 4656 ml  Net -1858.81 ml   Weight change: 0.4 kg  Physical Exam: seen in person on 11/27 with appropriate PPE  [place] adult male in bed intubated  HEENT normocephalic atraumatic; eyes  closed Neck trachea midline Lungs rhonchi bilaterally - PEEP 12 and FIO2 70 Heart RRR  Abdomen tighter and distended GU scrotal edema Extremities 1-2+ edema  Neuro - sedated and paralyzed Access: RIJ nontunneled dialysis catheter  Imaging: Dg Chest Port 1 View  Result Date: 07/20/2019 CLINICAL DATA:  Respiratory failure EXAM: PORTABLE CHEST 1 VIEW COMPARISON:  Radiograph 07/19/2019 FINDINGS: Endotracheal tube terminates in the mid trachea, 3.8 cm from the carina. Transesophageal feeding tube curls in the upper stomach terminating below the level of imaging. Small amount of retained contrast media is noted in the gastric fundus. Right IJ catheter sheath terminates in the mid SVC. There are persistent bilateral areas of airspace disease with some increasing lung inflation compared to prior study. Bilateral effusions are again noted. No visible pneumothorax. Cardiomediastinal contours are stable. No acute osseous or soft tissue abnormality. IMPRESSION: 1. No significant interval change in bilateral airspace disease and bilateral pleural effusions accounting for improved inflation. 2. Stable satisfactory positioning of the support lines and tubes. Electronically Signed   By: Lovena Le M.D.   On: 07/20/2019 06:53    Labs: BMET Recent Labs  Lab 07/17/19 0417  07/18/19 0455  07/18/19 2058  07/19/19 0324 07/19/19 1545 07/19/19 1710 07/20/19 0311 07/20/19 0401 07/20/19  1602 07/21/19 0337  NA 147*   < > 143   < > 140   < > 138 139 140 140 140 136 139  K 4.1   < > 5.5*   < > 6.0*   < > 5.6* 5.5* 5.5* 4.8 4.7 5.5* 5.2*  CL 113*  --  107  --  107  --  104  --  108  --  106 104 105  CO2 28  --  26  --  24  --  24  --  24  --  '26 26 27  '$ GLUCOSE 316*  --  334*  --  308*  --  312*  --  297*  --  200* 202* 163*  BUN 132*  --  142*  --  157*  --  158*  --  158*  --  111* 74* 60*  CREATININE 2.15*  --  2.91*  --  3.47*  --  3.59*  --  3.29*  --  2.28* 1.68* 1.59*  CALCIUM 7.6*  --  8.0*  --   7.6*  --  7.8*  --  7.7*  --  7.7* 7.6* 7.9*  PHOS 4.1  --  7.4*  --   --   --  8.7*  --  7.2*  --  4.3 4.2 3.6   < > = values in this interval not displayed.   CBC Recent Labs  Lab 07/15/19 0420  07/16/19 0430 07/17/19 0416  07/18/19 0455  07/19/19 0324 07/19/19 1545 07/20/19 0311 07/20/19 0401 07/21/19 0337  WBC 6.7  --  9.1 7.3  --  9.4  --  10.7*  --   --  4.7 4.4  NEUTROABS 6.1  --  7.8* 6.4  --  8.3*  --   --   --   --   --   --   HGB 11.6*   < > 11.9* 10.3*   < > 10.7*   < > 10.0* 9.5* 10.2* 8.9* 9.7*  HCT 37.5*   < > 38.0* 32.9*   < > 35.3*   < > 31.9* 28.0* 30.0* 28.0* 31.7*  MCV 76.1*  --  75.4* 74.8*  --  77.2*  --  75.4*  --   --  74.7* 76.0*  PLT 294  --  349 281  --  276  --  303  --   --  229 203   < > = values in this interval not displayed.    Medications:    . artificial tears  1 application Both Eyes W2X  . ascorbic acid  250 mg Per Tube Daily  . aspirin  81 mg Per Tube Daily  . chlorhexidine gluconate (MEDLINE KIT)  15 mL Mouth Rinse BID  . Chlorhexidine Gluconate Cloth  6 each Topical Daily  . dexamethasone (DECADRON) injection  6 mg Intravenous Daily  . famotidine  20 mg Per Tube Q24H  . feeding supplement (PRO-STAT SUGAR FREE 64)  60 mL Per Tube TID  . feeding supplement (VITAL 1.5 CAL)  1,000 mL Per Tube Q24H  . heparin  7,500 Units Subcutaneous Q8H  . insulin aspart  0-20 Units Subcutaneous Q4H  . insulin detemir  30 Units Subcutaneous BID  . mouth rinse  15 mL Mouth Rinse 10 times per day  . pravastatin  10 mg Per Tube Daily  . zinc sulfate  220 mg Per Tube Daily   Claudia Desanctis 07/21/2019, 6:22 AM

## 2019-07-22 ENCOUNTER — Inpatient Hospital Stay (HOSPITAL_COMMUNITY): Payer: BC Managed Care – PPO

## 2019-07-22 DIAGNOSIS — N170 Acute kidney failure with tubular necrosis: Secondary | ICD-10-CM

## 2019-07-22 DIAGNOSIS — J9601 Acute respiratory failure with hypoxia: Secondary | ICD-10-CM | POA: Diagnosis not present

## 2019-07-22 DIAGNOSIS — U071 COVID-19: Secondary | ICD-10-CM | POA: Diagnosis not present

## 2019-07-22 DIAGNOSIS — J1289 Other viral pneumonia: Secondary | ICD-10-CM | POA: Diagnosis not present

## 2019-07-22 LAB — CBC
HCT: 34.4 % — ABNORMAL LOW (ref 39.0–52.0)
Hemoglobin: 10.5 g/dL — ABNORMAL LOW (ref 13.0–17.0)
MCH: 23.4 pg — ABNORMAL LOW (ref 26.0–34.0)
MCHC: 30.5 g/dL (ref 30.0–36.0)
MCV: 76.8 fL — ABNORMAL LOW (ref 80.0–100.0)
Platelets: 196 10*3/uL (ref 150–400)
RBC: 4.48 MIL/uL (ref 4.22–5.81)
RDW: 18.2 % — ABNORMAL HIGH (ref 11.5–15.5)
WBC: 5.2 10*3/uL (ref 4.0–10.5)
nRBC: 0 % (ref 0.0–0.2)

## 2019-07-22 LAB — RENAL FUNCTION PANEL
Albumin: 1.8 g/dL — ABNORMAL LOW (ref 3.5–5.0)
Albumin: 2 g/dL — ABNORMAL LOW (ref 3.5–5.0)
Anion gap: 7 (ref 5–15)
Anion gap: 6 (ref 5–15)
BUN: 50 mg/dL — ABNORMAL HIGH (ref 8–23)
BUN: 46 mg/dL — ABNORMAL HIGH (ref 8–23)
CO2: 25 mmol/L (ref 22–32)
CO2: 27 mmol/L (ref 22–32)
Calcium: 7.8 mg/dL — ABNORMAL LOW (ref 8.9–10.3)
Calcium: 8 mg/dL — ABNORMAL LOW (ref 8.9–10.3)
Chloride: 104 mmol/L (ref 98–111)
Chloride: 105 mmol/L (ref 98–111)
Creatinine, Ser: 1.24 mg/dL (ref 0.61–1.24)
Creatinine, Ser: 1.37 mg/dL — ABNORMAL HIGH (ref 0.61–1.24)
GFR calc Af Amer: >60 mL/min (ref >60)
GFR calc Af Amer: >60 mL/min (ref >60)
GFR calc non Af Amer: >60 mL/min (ref >60)
GFR calc non Af Amer: 54 mL/min — ABNORMAL LOW (ref >60)
Glucose, Bld: 183 mg/dL — ABNORMAL HIGH (ref 70–99)
Glucose, Bld: 208 mg/dL — ABNORMAL HIGH (ref 70–99)
Phosphorus: 4 mg/dL (ref 2.5–4.6)
Phosphorus: 5.7 mg/dL — ABNORMAL HIGH (ref 2.5–4.6)
Potassium: 4.9 mmol/L (ref 3.5–5.1)
Potassium: 5 mmol/L (ref 3.5–5.1)
Sodium: 136 mmol/L (ref 135–145)
Sodium: 138 mmol/L (ref 135–145)

## 2019-07-22 LAB — POCT I-STAT 7, (LYTES, BLD GAS, ICA,H+H)
Acid-base deficit: 1 mmol/L (ref 0.0–2.0)
Bicarbonate: 27.8 mmol/L (ref 20.0–28.0)
Calcium, Ion: 1.2 mmol/L (ref 1.15–1.40)
HCT: 33 % — ABNORMAL LOW (ref 39.0–52.0)
Hemoglobin: 11.2 g/dL — ABNORMAL LOW (ref 13.0–17.0)
O2 Saturation: 89 %
Patient temperature: 36
Potassium: 4.9 mmol/L (ref 3.5–5.1)
Sodium: 137 mmol/L (ref 135–145)
TCO2: 30 mmol/L (ref 22–32)
pCO2 arterial: 62.2 mmHg — ABNORMAL HIGH (ref 32.0–48.0)
pH, Arterial: 7.253 — ABNORMAL LOW (ref 7.350–7.450)
pO2, Arterial: 65 mmHg — ABNORMAL LOW (ref 83.0–108.0)

## 2019-07-22 LAB — HEPATIC FUNCTION PANEL
ALT: 180 U/L — ABNORMAL HIGH (ref 0–44)
AST: 118 U/L — ABNORMAL HIGH (ref 15–41)
Albumin: 1.9 g/dL — ABNORMAL LOW (ref 3.5–5.0)
Alkaline Phosphatase: 178 U/L — ABNORMAL HIGH (ref 38–126)
Bilirubin, Direct: 0.1 mg/dL (ref 0.0–0.2)
Indirect Bilirubin: 0.4 mg/dL (ref 0.3–0.9)
Total Bilirubin: 0.5 mg/dL (ref 0.3–1.2)
Total Protein: 5.9 g/dL — ABNORMAL LOW (ref 6.5–8.1)

## 2019-07-22 LAB — GLUCOSE, CAPILLARY
Glucose-Capillary: 148 mg/dL — ABNORMAL HIGH (ref 70–99)
Glucose-Capillary: 158 mg/dL — ABNORMAL HIGH (ref 70–99)
Glucose-Capillary: 159 mg/dL — ABNORMAL HIGH (ref 70–99)
Glucose-Capillary: 177 mg/dL — ABNORMAL HIGH (ref 70–99)
Glucose-Capillary: 214 mg/dL — ABNORMAL HIGH (ref 70–99)
Glucose-Capillary: 234 mg/dL — ABNORMAL HIGH (ref 70–99)

## 2019-07-22 LAB — D-DIMER, QUANTITATIVE: D-Dimer, Quant: 2.8 ug/mL-FEU — ABNORMAL HIGH (ref 0.00–0.50)

## 2019-07-22 LAB — MAGNESIUM: Magnesium: 2.7 mg/dL — ABNORMAL HIGH (ref 1.7–2.4)

## 2019-07-22 LAB — PROCALCITONIN: Procalcitonin: 0.52 ng/mL

## 2019-07-22 MED ORDER — VANCOMYCIN HCL 10 G IV SOLR
1750.0000 mg | Freq: Once | INTRAVENOUS | Status: AC
  Administered 2019-07-22: 1750 mg via INTRAVENOUS
  Filled 2019-07-22: qty 1750

## 2019-07-22 MED ORDER — ROCURONIUM BROMIDE 10 MG/ML (PF) SYRINGE
PREFILLED_SYRINGE | INTRAVENOUS | Status: AC
  Administered 2019-07-22: 50 mg
  Filled 2019-07-22: qty 10

## 2019-07-22 MED ORDER — NEPRO/CARBSTEADY PO LIQD
1000.0000 mL | ORAL | Status: DC
  Administered 2019-07-22 – 2019-07-27 (×5): 1000 mL
  Filled 2019-07-22 (×8): qty 1000

## 2019-07-22 MED ORDER — SODIUM CHLORIDE 0.9 % IV SOLN
2.0000 g | Freq: Once | INTRAVENOUS | Status: AC
  Administered 2019-07-22: 2 g via INTRAVENOUS
  Filled 2019-07-22: qty 2

## 2019-07-22 MED ORDER — SODIUM CHLORIDE 0.9 % IV SOLN
2.0000 g | Freq: Two times a day (BID) | INTRAVENOUS | Status: DC
  Administered 2019-07-22 – 2019-07-28 (×12): 2 g via INTRAVENOUS
  Filled 2019-07-22 (×12): qty 2

## 2019-07-22 MED ORDER — VANCOMYCIN HCL IN DEXTROSE 1-5 GM/200ML-% IV SOLN
1000.0000 mg | INTRAVENOUS | Status: DC
  Administered 2019-07-23: 15:00:00 1000 mg via INTRAVENOUS
  Filled 2019-07-22 (×2): qty 200

## 2019-07-22 MED ORDER — ENOXAPARIN SODIUM 80 MG/0.8ML ~~LOC~~ SOLN
0.8000 mg/kg | SUBCUTANEOUS | Status: DC
  Administered 2019-07-22 – 2019-07-28 (×7): 70 mg via SUBCUTANEOUS
  Filled 2019-07-22 (×7): qty 0.7

## 2019-07-22 MED ORDER — PRISMASOL BGK 4/2.5 32-4-2.5 MEQ/L IV SOLN
INTRAVENOUS | Status: DC
  Administered 2019-07-22 – 2019-07-23 (×12): via INTRAVENOUS_CENTRAL
  Filled 2019-07-22 (×19): qty 5000

## 2019-07-22 MED ORDER — ROCURONIUM BROMIDE 50 MG/5ML IV SOLN
50.0000 mg | INTRAVENOUS | Status: AC
  Administered 2019-07-22: 50 mg via INTRAVENOUS
  Filled 2019-07-22: qty 5

## 2019-07-22 NOTE — Progress Notes (Signed)
NAME:  Craig Mccoy, MRN:  [id number], DOB:  July 01, 1956, LOS: 11 ADMISSION DATE:  07/04/2019, CONSULTATION DATE:  11/17 REFERRING MD:  Craig Mccoy, CHIEF COMPLAINT:  Acute respiratory failure   Brief History   Craig Mccoy is a 63 yo male, COVID positive, who was transferred from [facility] to [name] for iHD.   History of present illness   Craig Mccoy a 63 y.o.malewho has a PMH including but not limited to hypertension and prior MI per chart review (see "past medical history" for rest). Hepresented to [name] 11/17 with dyspnea and respiratory distress. He had apparently tested positive for COVID on 11/12 at an outside institution and had been in self isolation. His "lady friend" called EMS 11/17 due to above symptoms.  On EMS arrival, SpO2 was low despite supplemental O2. In ED, it was 77% before being placed on 15L NRB with improvement to 93%. Unfortunately, he deteriorated and required intubation. Repeat COVID testing returned as positive. He was also found to have multiple metabolic derangements including marked renal failure (SCr 14.63 with BUN 219), met acidosis + resp alkalosis (7.28, <19, 72), PCT 25.  Due to his potential to require iHD, he was transferred to [facility] for further evaluation and management.   Past Medical History  MI, HTN  Significant Hospital Events   11/17 - transferred to [name] from [facility]  11/19 - possible seizure like activity witnessed by RN 11/25 CRRT, proning 11/26 no proning 11/27 plugged off left mainstem requiring urgent bronch, switch off paralytics, switch fentanyl to dilaudid + ketamine 11/28 proning, re-paralyzed  Consults:  Nephrology neurology  Procedures:  ETT 11/17.  Echo - 11/18 cEEG 11/20 - 11/22 - diffuse encephalopathy. No seizures.   Significant Diagnostic Tests:  CXR - 11/17 - bilateral opacities CXR - 11/25 - consistent b/l opacities concerning for edema or PNA w/ b/l pleural effusions.  HS-trop - 1604>>1504 Ferritin 2490>>>890 CRP  - 38>>9.8>21.8 PCT - 25.01 D-Dimer 2.52>>>1.34; Fibrinogen > 800.  PT -16.4; INR 1.3. PTT 29.  Echo - 35% LVEF. Mild LVH. basl akinesis. G1DD CT head - no acute abnormality MRI brain 11/21 No acute abnormality  Micro Data:  Blood Cx - 11/17 - NG4D  Respiratory Cx 11/17 - moraxella catarrhalis Urine Cx 11/17 - no growth MRSA pcr - negative COVID 11/17 - positive  Antimicrobials:  CTX - 11/18 x 5 days Azithro - complete Remdesivir 11/17 >> 11/21 Actemra 11/24 Vancomycin 11/28>> Cefepime 11/28>>  Interim history/subjective:  Had bad mucus plugging yesterday requiring urgent bronch. Then able to wean paralytics but O2 needs remain extremely high, ventilation now worse. Remains heavily sedated to maintain any amount of vent synchrony.  Objective   Blood pressure 117/69, pulse 93, temperature (!) 97.5 F (36.4 C), resp. rate (!) 24, height '5\' 6"'$  (1.676 m), weight 85.8 kg, SpO2 94 %.    Vent Mode: PRVC FiO2 (%):  [90 %-100 %] 100 % Set Rate:  [32 bmp] 32 bmp Vt Set:  [350 mL] 350 mL PEEP:  [12 cmH20-18 cmH20] 18 cmH20 Plateau Pressure:  [26 cmH20-31 cmH20] 31 cmH20   Intake/Output Summary (Last 24 hours) at 07/22/2019 0910 Last data filed at 07/22/2019 0900 Gross per 24 hour  Intake 2276.15 ml  Output 5422 ml  Net -3145.85 ml   Filed Weights   07/20/19 0408 07/21/19 0400 07/22/19 0500  Weight: 92.7 kg 93.1 kg 85.8 kg    Examination: GEN: intubated sedated man on vent HEENT: ETT in place, thick tan secretions CV: RRR, ext  warm PULM: Severe rhonci bilaterally, triggering vent occasionally GI: Soft, +BS EXT: diffuse anasarca improved NEURO: moves ext occasionally to pain PSYCH: cannot assess SKIN: No rashes  BMET looks okay Low albumin persists, developing transaminitis, mild at this point CBC stable D dimer steadily trending up Sugars a little high  Resolved Hospital Problem list     Assessment & Plan:    # Acute respiratory failure with hypoxia  requiring mechanical ventilation due to COVID-19, m catarrhalis PNA -  s/p azithro (11/18-11/21) and CTX (11/17 - 11/21).   Also s/p remdesivir, actemra x 1, steroids x 10 days.  Secretions/O2 needs are worsening raising concern for VAP.  No improvement with fluid offloading unfortunately. # Rising D dimer- warrants switching to full [facility] # Acute encephalopathy - one episode of seizure like activity on 11/18. No further episodes seen. EEG did not show epileptiform activity, but did show diffuse encephalopathy. Neurology following, recommends holding anti-epileptics for now. MRI brain 11/21 No acute abnormality. etiology CNS depressing meds vs hypoactive delirium vs COVID-related encephalopathy  # Acute heart failure- some combination COVID myocarditis, shock cardiomyopathy. # Acute renal failure- related to acute illness, question of cardiorenal syndrome # Hyperglycemia- currently under control  - Continue to try to pull fluid as able, appreciate nephrology assstance - Steroids, remdesivir completed - Given worsening over past 36 hours, will re-prone, start empiric VAP coverage, narrowing depending on resp culture and Pct data, and switch heparin to full dose lovenox with goal anti-Xa 0.5-1, will ask pharmacy for assistance with this - Continue current sedation regimen, probably will re-paralyze - Poor prognosis  Best practice:  Diet: TF Pain/Anxiety/Delirium protocol (if indicated): PAD protocol VAP protocol (if indicated): in place DVT prophylaxis: switch to lovenox, f/u anti-Xa level monday GI prophylaxis: pepcid Glucose control: levemir w/ rSSI Mobility: Bedrest Code Status: full Family Communication: Called yesterday, will call again today, patient's mother passed at [facility] on 07/19/19 Disposition: ICU  35 minutes CC time  Erskine Emery MD PCCM

## 2019-07-22 NOTE — Progress Notes (Signed)
Continue to be unable to ventilate patient due to high dead space.  Cannot get MV beyond 11 L/min because of lung compliance and air trapping issues.  CXR showing continued severe ARDS.  Really nothing I can do to help him through this, any further deterioration and he will pass.  He is a DNR and will have one of my partners confirm this.  Erskine Emery MD PCCM

## 2019-07-22 NOTE — Progress Notes (Signed)
RT note: Assisted DR Tamala Julian with emergent bedside bronch obtaining sample and instilling Mucomyst.

## 2019-07-22 NOTE — Progress Notes (Signed)
Asked to evaluate patient by Dr. Erskine Emery  Patient currently on maximal support Unable to ventilate patient secondary to high dead space Lungs are noncompliant secondary to ARDS from pneumonia He has multiple comorbidities and continues to deteriorate clinically-encephalopathic, acute heart failure, acute renal failure  It will be futile to resuscitate this gentleman, likelihood of a successful resuscitation is unrealistic given his continuously deteriorating condition and inability to ventilate him  Patient should be DO NOT RESUSCITATE status

## 2019-07-22 NOTE — Progress Notes (Signed)
.  Assisted tele visit to patient with family. Patient name, room number and text number verified with the unit secretary.

## 2019-07-22 NOTE — Progress Notes (Signed)
Pharmacy Antibiotic & Anticoagulation Note  Craig Mccoy is a 63 y.o. male admitted on 07/20/2019 with COVID and now with increased O2 demands and secretions concerning for VAP.  Pharmacy has been consulted for Vancomycin + Cefepime dosing.  The patient is in AKI on CRRT and appears to be tolerating with minimal off time.   Dr. Tamala Julian also requested full dose Lovenox at a reduced dose of 0.8 mg/kg given AKI requiring CRRT with pharmacy assistance in adjusting the dose to maintain an anti-Xa level of 0.5-1. Will check a level at steady state and make adjustments as needed.  Plan: - Vancomycin 1750 mg IV x 1 followed by 1g IV every 24 hours - Cefepime 2g IV every 12 hours - Will continue to follow CRRT tolerance/duration, culture results, LOT, and antibiotic de-escalation plans   Height: 5\' 6"  (167.6 cm) Weight: 189 lb 2.5 oz (85.8 kg) IBW/kg (Calculated) : 63.8  Temp (24hrs), Avg:97.7 F (36.5 C), Min:96.3 F (35.7 C), Max:99.1 F (37.3 C)  Recent Labs  Lab 07/18/19 0455  07/19/19 0324  07/20/19 0401 07/20/19 1602 07/21/19 0337 07/21/19 1535 07/22/19 0419 07/22/19 0420  WBC 9.4  --  10.7*  --  4.7  --  4.4  --   --  5.2  CREATININE 2.91*   < > 3.59*   < > 2.28* 1.68* 1.59* 1.47* 1.24  --    < > = values in this interval not displayed.    Estimated Creatinine Clearance: 61.8 mL/min (by C-G formula based on SCr of 1.24 mg/dL).    No Known Allergies  Antimicrobials this admission: 11/17 Remdesivir >>11/21 11/18 Azith>> 11/21 11/17 Ceftriaxone>> 11/21 11/18 Dexamethasone>>11/28 11/28 Vanc >> 11/28 Cefepime >>  Dose adjustments this admission: n/a  Microbiology results: 11/17 MRSA PCR: neg 11/17 BCx x2: NGTD x3d 11/17 Ucx: Negative 11/17 COVID + 11/18 Trach asp: GPC in pairs stain - moraxella catarrhalis beta lactamase positive  11/19 BCx >> ngtd 11/28 RCx >>  Thank you for allowing pharmacy to be a part of this patient's care.  Alycia Rossetti, PharmD,  BCPS Clinical Pharmacist Clinical phone for 07/22/2019: [phone] 07/22/2019 10:44 AM   **Pharmacist phone directory can now be found on [email or web address] ([email or web address]).  Listed under [place].

## 2019-07-22 NOTE — Progress Notes (Addendum)
Pt turned to prone position with Dr. Tamala Julian, 2 RTs and 2 RNs  Paralytic push given to keep pt compliant with vent  Will continue to closely monitor patient

## 2019-07-22 NOTE — Progress Notes (Signed)
Craig Mccoy Progress Note    Assessment/ Plan:   1. AKI - secondary to sepsis + COVID now with ATN and hypotension.  Initially required a short run of CRRT 11/17-11/18. Now with worsening renal function.  Spoke with his son, Kasandra Knudsen [phone] who does agree with proceed with CRRT. Restarted CRRT on 11/25 (note nontunned line was still in place) - Continue CRRT for now through the weekend to optimize resp stats; goal UF net negative 125 to 150 cc/hr as tolerated.  On 2K dialysate - switch back to 4K and have changed tube feeds to nepro as below - transitioned to nepro for feeds    2. COVID PNA s/p Remdesivir; decadron per PCCM  3. Acute hypoxic respiratory failure on mechanical ventilation per pulm; optimize volume status with CRRT   4. Hyperkalemia: with AKI - on CRRT   5. Septic shock:  Therapies per primary team; off of levo     6. Hyperphos - resolved with CRRT   7. Anemia - multifactorial - hx AKI.  No acute indication for PRBC's.  Iron stores are replete    8. Hypernatremia: resolved.  Discontinued additional free water for now and monitor for need to restart    Subjective:    Spoke with nursing.  Seen on CRRT - procedure supervised.  Had  700 mL UOP over 11/27 as charted thus far.  He had 4.4 liters UF with CRRT over 11/27 as charted.  He was changed to 2K dialysate last night given K.  He had a bronch yesterday and mucous plugs removed.  Left on 100% fio2 after bronch.  Off of levo - pressures higher after sedation weaned   Review of systems  Unable to obtain 2/2 intubated    Objective:   BP 124/77   Pulse 93   Temp 98.6 F (37 C)   Resp (!) 32   Ht '5\' 6"'$  (1.676 m)   Wt 85.8 kg   SpO2 98%   BMI 30.53 kg/m   Intake/Output Summary (Last 24 hours) at 07/22/2019 0647 Last data filed at 07/22/2019 0600 Gross per 24 hour  Intake 2333.58 ml  Output 5309 ml  Net -2975.42 ml   Weight change: -7.3 kg  Physical Exam: seen in person on 11/27  with appropriate PPE. Discussed with nursing and viewed through window on 11/28.  Due to the nature of this patient's suspected COVID-19 with isolation and in keeping with efforts to prevent the spread of infection and to conserve personal protective equipment, a physical exam was not personally performed.  Patient's symptoms and exam were discussed in detail with the RN.  A chart review of other providers notes and the patient's lab work as well as review of other pertinent studies was performed.  Exam details from prior documentation were reviewed specifically and confirmed with the bedside nurse.  Location of service: [place] adult male in bed intubated  HEENT normocephalic atraumatic; eyes closed Neck trachea midline Lungs lungs are cleared today per nursing - PEEP 12 and FIO2 90 Heart RRR  Abdomen tight and distended GU scrotal edema Extremities 2+ edema  Per nursing Neuro - sedation being weaned Access: RIJ nontunneled dialysis catheter  Imaging: No results found.  Labs: BMET Recent Labs  Lab 07/19/19 0324  07/19/19 1710  07/20/19 0401 07/20/19 1602 07/21/19 0337 07/21/19 1006 07/21/19 1535 07/22/19 0351 07/22/19 0419  NA 138   < > 140   < > 140 136 139 137  137 137 136  K 5.6*   < > 5.5*   < > 4.7 5.5* 5.2* 4.6 5.4* 4.9 4.9  CL 104  --  108  --  106 104 105  --  104  --  104  CO2 24  --  24  --  '26 26 27  '$ --  25  --  25  GLUCOSE 312*  --  297*  --  200* 202* 163*  --  273*  --  183*  BUN 158*  --  158*  --  111* 74* 60*  --  53*  --  50*  CREATININE 3.59*  --  3.29*  --  2.28* 1.68* 1.59*  --  1.47*  --  1.24  CALCIUM 7.8*  --  7.7*  --  7.7* 7.6* 7.9*  --  7.9*  --  7.8*  PHOS 8.7*  --  7.2*  --  4.3 4.2 3.6  --  3.1  --  4.0   < > = values in this interval not displayed.   CBC Recent Labs  Lab 07/16/19 0430 07/17/19 0416  07/18/19 0455  07/19/19 0324  07/20/19 0401 07/21/19 0337 07/21/19 1006 07/22/19 0351 07/22/19 0420  WBC 9.1 7.3   --  9.4  --  10.7*  --  4.7 4.4  --   --  5.2  NEUTROABS 7.8* 6.4  --  8.3*  --   --   --   --   --   --   --   --   HGB 11.9* 10.3*   < > 10.7*   < > 10.0*   < > 8.9* 9.7* 10.2* 11.2* 10.5*  HCT 38.0* 32.9*   < > 35.3*   < > 31.9*   < > 28.0* 31.7* 30.0* 33.0* 34.4*  MCV 75.4* 74.8*  --  77.2*  --  75.4*  --  74.7* 76.0*  --   --  76.8*  PLT 349 281  --  276  --  303  --  229 203  --   --  196   < > = values in this interval not displayed.    Medications:    . acetylcysteine  4 mL Nebulization Once  . acetylcysteine  4 mL Nebulization TID  . artificial tears  1 application Both Eyes B2I  . ascorbic acid  250 mg Per Tube Daily  . aspirin  81 mg Per Tube Daily  . chlorhexidine gluconate (MEDLINE KIT)  15 mL Mouth Rinse BID  . Chlorhexidine Gluconate Cloth  6 each Topical Daily  . famotidine  20 mg Per Tube Q24H  . feeding supplement (PRO-STAT SUGAR FREE 64)  60 mL Per Tube TID  . feeding supplement (VITAL 1.5 CAL)  1,000 mL Per Tube Q24H  . heparin  7,500 Units Subcutaneous Q8H  .  HYDROmorphone (DILAUDID) injection  1 mg Intravenous Once  . insulin aspart  0-20 Units Subcutaneous Q4H  . insulin detemir  30 Units Subcutaneous BID  . mouth rinse  15 mL Mouth Rinse 10 times per day  . pravastatin  10 mg Per Tube Daily  . sodium chloride flush  10-40 mL Intracatheter Q12H  . zinc sulfate  220 mg Per Tube Daily   Claudia Desanctis 07/22/2019, 6:47 AM

## 2019-07-22 NOTE — Progress Notes (Signed)
Not tolerating proning, sats dropping, marked hypercarbia on abg. Switched back to supine, changed vent to VC and able to get MV to 11 L/min, check ABG in 30 mins, sats in 70s-80s. Quick look with bronch showed no mucus plugging and good ETT position Stat CXR ordered I will call family, I don't think Mr. Dejuan is going to live through this.

## 2019-07-23 DIAGNOSIS — U071 COVID-19: Secondary | ICD-10-CM | POA: Diagnosis not present

## 2019-07-23 DIAGNOSIS — N170 Acute kidney failure with tubular necrosis: Secondary | ICD-10-CM | POA: Diagnosis not present

## 2019-07-23 DIAGNOSIS — J9601 Acute respiratory failure with hypoxia: Secondary | ICD-10-CM | POA: Diagnosis not present

## 2019-07-23 DIAGNOSIS — J1289 Other viral pneumonia: Secondary | ICD-10-CM | POA: Diagnosis not present

## 2019-07-23 LAB — CBC
HCT: 34.7 % — ABNORMAL LOW (ref 39.0–52.0)
Hemoglobin: 10.4 g/dL — ABNORMAL LOW (ref 13.0–17.0)
MCH: 23.5 pg — ABNORMAL LOW (ref 26.0–34.0)
MCHC: 30 g/dL (ref 30.0–36.0)
MCV: 78.3 fL — ABNORMAL LOW (ref 80.0–100.0)
Platelets: 197 10*3/uL (ref 150–400)
RBC: 4.43 MIL/uL (ref 4.22–5.81)
RDW: 19.1 % — ABNORMAL HIGH (ref 11.5–15.5)
WBC: 7.7 10*3/uL (ref 4.0–10.5)
nRBC: 0 % (ref 0.0–0.2)

## 2019-07-23 LAB — RENAL FUNCTION PANEL
Albumin: 1.9 g/dL — ABNORMAL LOW (ref 3.5–5.0)
Albumin: 2 g/dL — ABNORMAL LOW (ref 3.5–5.0)
Anion gap: 7 (ref 5–15)
Anion gap: 8 (ref 5–15)
BUN: 45 mg/dL — ABNORMAL HIGH (ref 8–23)
BUN: 42 mg/dL — ABNORMAL HIGH (ref 8–23)
CO2: 24 mmol/L (ref 22–32)
CO2: 23 mmol/L (ref 22–32)
Calcium: 7.8 mg/dL — ABNORMAL LOW (ref 8.9–10.3)
Calcium: 7.7 mg/dL — ABNORMAL LOW (ref 8.9–10.3)
Chloride: 106 mmol/L (ref 98–111)
Chloride: 105 mmol/L (ref 98–111)
Creatinine, Ser: 1.43 mg/dL — ABNORMAL HIGH (ref 0.61–1.24)
Creatinine, Ser: 1.48 mg/dL — ABNORMAL HIGH (ref 0.61–1.24)
GFR calc Af Amer: 60 mL/min — ABNORMAL LOW (ref >60)
GFR calc Af Amer: 57 mL/min — ABNORMAL LOW (ref >60)
GFR calc non Af Amer: 51 mL/min — ABNORMAL LOW (ref >60)
GFR calc non Af Amer: 49 mL/min — ABNORMAL LOW (ref >60)
Glucose, Bld: 113 mg/dL — ABNORMAL HIGH (ref 70–99)
Glucose, Bld: 150 mg/dL — ABNORMAL HIGH (ref 70–99)
Phosphorus: 4.2 mg/dL (ref 2.5–4.6)
Phosphorus: 4.3 mg/dL (ref 2.5–4.6)
Potassium: 5 mmol/L (ref 3.5–5.1)
Potassium: 5.2 mmol/L — ABNORMAL HIGH (ref 3.5–5.1)
Sodium: 137 mmol/L (ref 135–145)
Sodium: 136 mmol/L (ref 135–145)

## 2019-07-23 LAB — POCT I-STAT 7, (LYTES, BLD GAS, ICA,H+H)
Acid-base deficit: 1 mmol/L (ref 0.0–2.0)
Bicarbonate: 27.8 mmol/L (ref 20.0–28.0)
Calcium, Ion: 1.2 mmol/L (ref 1.15–1.40)
HCT: 33 % — ABNORMAL LOW (ref 39.0–52.0)
Hemoglobin: 11.2 g/dL — ABNORMAL LOW (ref 13.0–17.0)
O2 Saturation: 100 %
Patient temperature: 36.7
Potassium: 5 mmol/L (ref 3.5–5.1)
Sodium: 137 mmol/L (ref 135–145)
TCO2: 30 mmol/L (ref 22–32)
pCO2 arterial: 69.6 mmHg (ref 32.0–48.0)
pH, Arterial: 7.207 — ABNORMAL LOW (ref 7.350–7.450)
pO2, Arterial: 246 mmHg — ABNORMAL HIGH (ref 83.0–108.0)

## 2019-07-23 LAB — MAGNESIUM: Magnesium: 2.6 mg/dL — ABNORMAL HIGH (ref 1.7–2.4)

## 2019-07-23 LAB — HEPATIC FUNCTION PANEL
ALT: 142 U/L — ABNORMAL HIGH (ref 0–44)
AST: 62 U/L — ABNORMAL HIGH (ref 15–41)
Albumin: 1.9 g/dL — ABNORMAL LOW (ref 3.5–5.0)
Alkaline Phosphatase: 181 U/L — ABNORMAL HIGH (ref 38–126)
Bilirubin, Direct: <0.1 mg/dL (ref 0.0–0.2)
Total Bilirubin: 0.5 mg/dL (ref 0.3–1.2)
Total Protein: 5.6 g/dL — ABNORMAL LOW (ref 6.5–8.1)

## 2019-07-23 LAB — GLUCOSE, CAPILLARY
Glucose-Capillary: 115 mg/dL — ABNORMAL HIGH (ref 70–99)
Glucose-Capillary: 130 mg/dL — ABNORMAL HIGH (ref 70–99)
Glucose-Capillary: 156 mg/dL — ABNORMAL HIGH (ref 70–99)
Glucose-Capillary: 172 mg/dL — ABNORMAL HIGH (ref 70–99)
Glucose-Capillary: 125 mg/dL — ABNORMAL HIGH (ref 70–99)

## 2019-07-23 LAB — D-DIMER, QUANTITATIVE: D-Dimer, Quant: 3.78 ug/mL-FEU — ABNORMAL HIGH (ref 0.00–0.50)

## 2019-07-23 MED ORDER — CHLORHEXIDINE GLUCONATE 0.12 % MT SOLN
OROMUCOSAL | Status: AC
  Administered 2019-07-23: 15 mL via OROMUCOSAL
  Filled 2019-07-23: qty 15

## 2019-07-23 MED ORDER — PRISMASOL BGK 0/2.5 32-2.5 MEQ/L IV SOLN
INTRAVENOUS | Status: DC
  Administered 2019-07-23 – 2019-07-25 (×14): via INTRAVENOUS_CENTRAL
  Filled 2019-07-23 (×19): qty 5000

## 2019-07-23 NOTE — Progress Notes (Signed)
NAME:  Craig Mccoy, MRN:  [id number], DOB:  [birth year], LOS: 12 ADMISSION DATE:  07/14/2019, CONSULTATION DATE:  11/17 REFERRING MD:  Rogene Houston, CHIEF COMPLAINT:  Acute respiratory failure   Brief History   Craig Mccoy is a 63 yo male, COVID positive, who was transferred from [facility] to [facility] for iHD.   History of present illness   Craig Mccoy a 63 y.o.malewho has a PMH including but not limited to hypertension and prior MI per chart review (see "past medical history" for rest). Hepresented to [name] 11/17 with dyspnea and respiratory distress. He had apparently tested positive for COVID on 11/12 at an outside institution and had been in self isolation. His "lady friend" called EMS 11/17 due to above symptoms.  On EMS arrival, SpO2 was low despite supplemental O2. In ED, it was 77% before being placed on 15L NRB with improvement to 93%. Unfortunately, he deteriorated and required intubation. Repeat COVID testing returned as positive. He was also found to have multiple metabolic derangements including marked renal failure (SCr 14.63 with BUN 219), met acidosis + resp alkalosis (7.28, <19, 72), PCT 25.  Due to his potential to require iHD, he was transferred to [facility] for further evaluation and management.   Past Medical History  MI, HTN  Significant Hospital Events   11/17 - transferred to [facility] from [facility]  11/19 - possible seizure like activity witnessed by RN 11/25 CRRT, proning 11/26 no proning 11/27 plugged off left mainstem requiring urgent bronch, switch off paralytics, switch fentanyl to dilaudid + ketamine 11/28 proning, re-paralyzed, inability to ventilate despite all aggressive measures, 2 physician DNR 11/29 survived night, mildly improved ventilation  Consults:  Nephrology neurology  Procedures/Tests/Lines:  ETT 11/17.  R femoral CVC 07/10/2019 R IJ HD catheter 07/10/2019 Rectal tube 07/20/19 Cortrak 07/19/19 R radial arterial line 07/18/19  cEEG 11/20 - 11/22 -  diffuse encephalopathy. No seizures.  CXR 11/28 ARDS Echo 11/18 EF 35% LE duplex 11/20 no DVT CT head - no acute abnormality MRI brain 11/21 No acute abnormality   Micro Data:  Resp culture 11/28>> pending Blood Cx - 11/17 - NG4D  Respiratory Cx 11/17 - moraxella catarrhalis Urine Cx 11/17 - no growth MRSA pcr - negative COVID 11/17 - positive  Antimicrobials:  CTX - 11/18 x 5 days Azithro - complete Remdesivir 11/17 >> 11/21 Actemra 11/24 Vancomycin 11/28>> Cefepime 11/28>>  Interim history/subjective:  Did not tolerate proning yesterday, see notes Thought would pass overnight but seems to have settled out Continued difficulty with ventilation, not improved with paralytics so held off on nimbex drip. Remains heavily sedated, on vent, on low dose levophed.  Objective   Blood pressure 100/61, pulse 100, temperature 98.4 F (36.9 C), temperature source Bladder, resp. rate (!) 25, height '5\' 6"'$  (1.676 m), weight 86.4 kg, SpO2 97 %.    Vent Mode: Volume support FiO2 (%):  [80 %-100 %] 80 % Set Rate:  [30 bmp-32 bmp] 30 bmp Vt Set:  [350 mL-400 mL] 400 mL PEEP:  [14 cmH20-18 cmH20] 14 cmH20 Plateau Pressure:  [36 cmH20-42 cmH20] 36 cmH20   Intake/Output Summary (Last 24 hours) at 07/23/2019 0752 Last data filed at 07/23/2019 0700 Gross per 24 hour  Intake 3298.24 ml  Output 3963 ml  Net -664.76 ml   Filed Weights   07/21/19 0400 07/22/19 0500 07/23/19 0345  Weight: 93.1 kg 85.8 kg 86.4 kg    Examination: GEN: intubated sedated man on vent HEENT: ETT in place, less ETT secretions today,  copious oropharyngeal secretions CV: RRR, ext warm PULM: Less rhonci bilaterally, very occasional vent triggering GI: Soft, +BS, rectal tube in place EXT: minimal anasarca NEURO: moves ext occasionally to pain PSYCH: cannot assess SKIN: No rashes, R femoral CVC, R Radial A line, R IJ HD cath appear CDI  BMET looks okay Low albumin persists, transaminitis improved CBC stable  D dimer up some more Sugars better  Resolved Hospital Problem list     Assessment & Plan:    # Acute respiratory failure with hypoxia requiring mechanical ventilation due to COVID-19, m catarrhalis PNA -  s/p azithro (11/18-11/21) and CTX (11/17 - 11/21).   Also s/p remdesivir, actemra x 1, steroids x 10 days.  Secretions/O2 needs are worsening raising concern for VAP.  No improvement with fluid offloading unfortunately. # Rising D dimer- warrants switching to full [facility] # Acute encephalopathy - one episode of seizure like activity on 11/18. No further episodes seen. EEG did not show epileptiform activity, but did show diffuse encephalopathy. Neurology following, recommends holding anti-epileptics for now. MRI brain 11/21 No acute abnormality. etiology CNS depressing meds vs hypoactive delirium vs COVID-related encephalopathy  # Acute heart failure- some combination COVID myocarditis, shock cardiomyopathy. # Acute renal failure- related to acute illness, question of cardiorenal syndrome # Hyperglycemia- currently under control  - Keep even with CRRT, appreciate nephro help - Steroids, remdesivir completed - Continue lovenox full dose CRRT dosing with anti-Xa monitoring after 3rd dose, appreciate pharm help with this - Will not reprone, almost killed him yesterday with this - Hold off further paralytics, seems to have settled out with benzo/ketamine/dilaudid drips - Continue vanc/cefepime, f/u sputum culture data, Pct reassuring - Poor prognosis, DNR but will continue everything else for now  Best practice:  Diet: TF Pain/Anxiety/Delirium protocol (if indicated): PAD protocol VAP protocol (if indicated): in place DVT prophylaxis: lovenox, f/u anti-Xa level monday GI prophylaxis: pepcid Glucose control: levemir w/ rSSI Mobility: Bedrest Code Status: full Family Communication:  will call again today, patient's mother passed at [facility] on 07/19/19 Disposition: ICU  33 minutes CC time  Erskine Emery MD PCCM

## 2019-07-23 NOTE — Progress Notes (Signed)
Called and updated daughter that we are doing everything possible but he still may not make it.  She understands and appreciates all efforts to this point.  Erskine Emery MD

## 2019-07-23 NOTE — Progress Notes (Signed)
Patient SpO2 70-80%'s. He was tachypneic and using accessory muscles, so sedation increased as per orders. Patient receiving appropriate volumes, peak pressures <30. Minimal secretions obtain by endotracheal suctioning. RT was notified and FiO2 increased to 1.0 with corresponding SpO2 increase to >90%. Dr. Tamala Julian was paged and notified and no further orders were obtained.  Myrle Sheng, BSN, RN 18M/107M MICU

## 2019-07-23 NOTE — Progress Notes (Signed)
[facility] Progress Note    Assessment/ Plan:   1. AKI - secondary to sepsis + COVID now with ATN and hypotension.  Initially required a short run of CRRT 11/17-11/18. Now with worsening renal function.  Spoke with his son, Kasandra Knudsen [phone] who does agree with proceed with CRRT. Restarted CRRT on 11/25 (note nontunned line was still in place) - Continue CRRT for now - continue keep even for now as deterioration yesterday and goals of care are being re-evaluated per nursing report.  No improvement with UF thus far unfortunately. - transitioned to nepro for feeds    2. COVID PNA s/p Remdesivir; decadron per PCCM  3. Acute hypoxic respiratory failure on mechanical ventilation per pulm; optimize volume status with CRRT   4. Hyperkalemia: with AKI - on CRRT   5. Septic shock:  Therapies per primary team; on levo   6. Hyperphos - resolved with CRRT   7. Anemia - multifactorial - hx AKI.  No acute indication for PRBC's.  Iron stores are replete     8. Hypernatremia: resolved.  Off of additional free water for now and monitor for need to restart    Subjective:    Spoke with nursing.  Seen on CRRT - procedure supervised.  Patient had hypotension yesterday and was initiated on levo - on 2 mcg/min.  CRRT uf goal set to net even.  He was made DNR.  Bronch yesterday but still on high FIO2 and PEEP.  Not oxygenating well.  Had 100 mL UOP over 11/28 as charted thus far.  He had 2.9 liters UF with CRRT over 11/28. (net neg 125 to 150 then changed to keep even with hypotension). proned yesterday.  Large amount of stool yesterday (900 mL)  Review of systems  Unable to obtain 2/2 intubated    Objective:   BP 102/68   Pulse 100   Temp 98.4 F (36.9 C)   Resp 17   Ht '5\' 6"'$  (1.676 m)   Wt 86.4 kg   SpO2 98%   BMI 30.74 kg/m   Intake/Output Summary (Last 24 hours) at 07/23/2019 0630 Last data filed at 07/23/2019 0600 Gross per 24 hour  Intake 3293.5 ml  Output 4088  ml  Net -794.5 ml   Weight change: 0.6 kg  Physical Exam: seen in person on 11/29 with appropriate PPE  General adult male in bed intubated  HEENT normocephalic atraumatic; eyes closed Neck trachea midline Lungs coarse mechanical breath sounds and reduced - PEEP 14 and FIO2 80 Heart RRR  Abdomen soft but distended bowel sounds  GU scrotal edema improved Extremities no pitting edema legs or thighs appreciated  Neuro - sedation running; no continuous paralytic  Access: RIJ nontunneled dialysis catheter  Imaging: Dg Chest 1 View  Result Date: 07/22/2019 CLINICAL DATA:  ARDS. COVID-19 positive. EXAM: CHEST  1 VIEW COMPARISON:  Chest x-rays dated 07/20/2019 and 07/19/2019. FINDINGS: Support apparatus appears stable in position. Heart size and mediastinal contours appear stable. Again noted are bilateral diffuse airspace opacities. Probable small bilateral pleural effusions. No pneumothorax is seen. Osseous structures about the chest are unremarkable. IMPRESSION: 1. Stable diffuse bilateral airspace opacities, compatible with the given history of multifocal pneumonia and/or ARDS. 2. Probable small bilateral pleural effusions. 3. Stable appearance of the support apparatus. Electronically Signed   By: Franki Cabot M.D.   On: 07/22/2019 12:27    Labs: BMET Recent Labs  Lab 07/20/19 0401 07/20/19 1602 07/21/19 0337 07/21/19 1006 07/21/19 1535 07/22/19  2023 07/22/19 0419 07/22/19 1556 07/23/19 0330 07/23/19 0340  NA '140 136 139 137 137 137 136 138 137 137 '$  K 4.7 5.5* 5.2* 4.6 5.4* 4.9 4.9 5.0 5.0 5.0  CL 106 104 105  --  104  --  104 105  --  106  CO2 '26 26 27  '$ --  25  --  25 27  --  24  GLUCOSE 200* 202* 163*  --  273*  --  183* 208*  --  113*  BUN 111* 74* 60*  --  53*  --  50* 46*  --  45*  CREATININE 2.28* 1.68* 1.59*  --  1.47*  --  1.24 1.37*  --  1.43*  CALCIUM 7.7* 7.6* 7.9*  --  7.9*  --  7.8* 8.0*  --  7.8*  PHOS 4.3 4.2 3.6  --  3.1  --  4.0 5.7*  --  4.2    CBC Recent Labs  Lab 07/17/19 0416  07/18/19 0455  07/20/19 0401 07/21/19 0337  07/22/19 0351 07/22/19 0420 07/23/19 0330 07/23/19 0341  WBC 7.3  --  9.4   < > 4.7 4.4  --   --  5.2  --  7.7  NEUTROABS 6.4  --  8.3*  --   --   --   --   --   --   --   --   HGB 10.3*   < > 10.7*   < > 8.9* 9.7*   < > 11.2* 10.5* 11.2* 10.4*  HCT 32.9*   < > 35.3*   < > 28.0* 31.7*   < > 33.0* 34.4* 33.0* 34.7*  MCV 74.8*  --  77.2*   < > 74.7* 76.0*  --   --  76.8*  --  78.3*  PLT 281  --  276   < > 229 203  --   --  196  --  197   < > = values in this interval not displayed.    Medications:    . acetylcysteine  4 mL Nebulization Once  . artificial tears  1 application Both Eyes X4D  . ascorbic acid  250 mg Per Tube Daily  . aspirin  81 mg Per Tube Daily  . chlorhexidine gluconate (MEDLINE KIT)  15 mL Mouth Rinse BID  . Chlorhexidine Gluconate Cloth  6 each Topical Daily  . enoxaparin (LOVENOX) injection  0.8 mg/kg Subcutaneous Q24H  . famotidine  20 mg Per Tube Q24H  . feeding supplement (PRO-STAT SUGAR FREE 64)  60 mL Per Tube TID  .  HYDROmorphone (DILAUDID) injection  1 mg Intravenous Once  . insulin aspart  0-20 Units Subcutaneous Q4H  . insulin detemir  30 Units Subcutaneous BID  . mouth rinse  15 mL Mouth Rinse 10 times per day  . pravastatin  10 mg Per Tube Daily  . sodium chloride flush  10-40 mL Intracatheter Q12H  . zinc sulfate  220 mg Per Tube Daily   Claudia Desanctis 07/23/2019, 6:30 AM

## 2019-07-24 DIAGNOSIS — G9340 Encephalopathy, unspecified: Secondary | ICD-10-CM | POA: Diagnosis not present

## 2019-07-24 DIAGNOSIS — J80 Acute respiratory distress syndrome: Secondary | ICD-10-CM | POA: Diagnosis not present

## 2019-07-24 DIAGNOSIS — N179 Acute kidney failure, unspecified: Secondary | ICD-10-CM | POA: Diagnosis not present

## 2019-07-24 DIAGNOSIS — U071 COVID-19: Secondary | ICD-10-CM | POA: Diagnosis not present

## 2019-07-24 LAB — CBC
HCT: 34 % — ABNORMAL LOW (ref 39.0–52.0)
Hemoglobin: 9.9 g/dL — ABNORMAL LOW (ref 13.0–17.0)
MCH: 23.6 pg — ABNORMAL LOW (ref 26.0–34.0)
MCHC: 29.1 g/dL — ABNORMAL LOW (ref 30.0–36.0)
MCV: 81 fL (ref 80.0–100.0)
Platelets: 175 10*3/uL (ref 150–400)
RBC: 4.2 MIL/uL — ABNORMAL LOW (ref 4.22–5.81)
RDW: 19.7 % — ABNORMAL HIGH (ref 11.5–15.5)
WBC: 10.1 10*3/uL (ref 4.0–10.5)
nRBC: 0.2 % (ref 0.0–0.2)

## 2019-07-24 LAB — RENAL FUNCTION PANEL
Albumin: 2.1 g/dL — ABNORMAL LOW (ref 3.5–5.0)
Albumin: 2.2 g/dL — ABNORMAL LOW (ref 3.5–5.0)
Anion gap: 8 (ref 5–15)
Anion gap: 9 (ref 5–15)
BUN: 38 mg/dL — ABNORMAL HIGH (ref 8–23)
BUN: 33 mg/dL — ABNORMAL HIGH (ref 8–23)
CO2: 27 mmol/L (ref 22–32)
CO2: 25 mmol/L (ref 22–32)
Calcium: 8.1 mg/dL — ABNORMAL LOW (ref 8.9–10.3)
Calcium: 8.1 mg/dL — ABNORMAL LOW (ref 8.9–10.3)
Chloride: 103 mmol/L (ref 98–111)
Chloride: 102 mmol/L (ref 98–111)
Creatinine, Ser: 1.5 mg/dL — ABNORMAL HIGH (ref 0.61–1.24)
Creatinine, Ser: 1.49 mg/dL — ABNORMAL HIGH (ref 0.61–1.24)
GFR calc Af Amer: 56 mL/min — ABNORMAL LOW (ref >60)
GFR calc Af Amer: 57 mL/min — ABNORMAL LOW (ref >60)
GFR calc non Af Amer: 49 mL/min — ABNORMAL LOW (ref >60)
GFR calc non Af Amer: 49 mL/min — ABNORMAL LOW (ref >60)
Glucose, Bld: 125 mg/dL — ABNORMAL HIGH (ref 70–99)
Glucose, Bld: 150 mg/dL — ABNORMAL HIGH (ref 70–99)
Phosphorus: 4.6 mg/dL (ref 2.5–4.6)
Phosphorus: 4 mg/dL (ref 2.5–4.6)
Potassium: 4.5 mmol/L (ref 3.5–5.1)
Potassium: 4 mmol/L (ref 3.5–5.1)
Sodium: 138 mmol/L (ref 135–145)
Sodium: 136 mmol/L (ref 135–145)

## 2019-07-24 LAB — CULTURE, RESPIRATORY W GRAM STAIN: Culture: NORMAL

## 2019-07-24 LAB — HEPATIC FUNCTION PANEL
ALT: 98 U/L — ABNORMAL HIGH (ref 0–44)
AST: 40 U/L (ref 15–41)
Albumin: 2.1 g/dL — ABNORMAL LOW (ref 3.5–5.0)
Alkaline Phosphatase: 196 U/L — ABNORMAL HIGH (ref 38–126)
Bilirubin, Direct: <0.1 mg/dL (ref 0.0–0.2)
Total Bilirubin: 0.5 mg/dL (ref 0.3–1.2)
Total Protein: 5.8 g/dL — ABNORMAL LOW (ref 6.5–8.1)

## 2019-07-24 LAB — POCT I-STAT 7, (LYTES, BLD GAS, ICA,H+H)
Acid-base deficit: 3 mmol/L — ABNORMAL HIGH (ref 0.0–2.0)
Bicarbonate: 26 mmol/L (ref 20.0–28.0)
Calcium, Ion: 1.21 mmol/L (ref 1.15–1.40)
HCT: 32 % — ABNORMAL LOW (ref 39.0–52.0)
Hemoglobin: 10.9 g/dL — ABNORMAL LOW (ref 13.0–17.0)
O2 Saturation: 86 %
Patient temperature: 36.5
Potassium: 3.9 mmol/L (ref 3.5–5.1)
Sodium: 134 mmol/L — ABNORMAL LOW (ref 135–145)
TCO2: 28 mmol/L (ref 22–32)
pCO2 arterial: 66.3 mmHg (ref 32.0–48.0)
pH, Arterial: 7.198 — CL (ref 7.350–7.450)
pO2, Arterial: 63 mmHg — ABNORMAL LOW (ref 83.0–108.0)

## 2019-07-24 LAB — D-DIMER, QUANTITATIVE: D-Dimer, Quant: 5.54 ug/mL-FEU — ABNORMAL HIGH (ref 0.00–0.50)

## 2019-07-24 LAB — GLUCOSE, CAPILLARY
Glucose-Capillary: 104 mg/dL — ABNORMAL HIGH (ref 70–99)
Glucose-Capillary: 104 mg/dL — ABNORMAL HIGH (ref 70–99)
Glucose-Capillary: 136 mg/dL — ABNORMAL HIGH (ref 70–99)
Glucose-Capillary: 176 mg/dL — ABNORMAL HIGH (ref 70–99)
Glucose-Capillary: 148 mg/dL — ABNORMAL HIGH (ref 70–99)
Glucose-Capillary: 125 mg/dL — ABNORMAL HIGH (ref 70–99)

## 2019-07-24 LAB — MAGNESIUM: Magnesium: 2.7 mg/dL — ABNORMAL HIGH (ref 1.7–2.4)

## 2019-07-24 LAB — HEPARIN ANTI-XA: Heparin LMW: 1.05 IU/mL

## 2019-07-24 MED ORDER — CLONAZEPAM 0.5 MG PO TBDP
1.0000 mg | ORAL_TABLET | Freq: Two times a day (BID) | ORAL | Status: DC
  Administered 2019-07-24 – 2019-07-28 (×9): 1 mg
  Filled 2019-07-24 (×9): qty 2

## 2019-07-24 MED ORDER — VECURONIUM BROMIDE 10 MG IV SOLR
10.0000 mg | Freq: Three times a day (TID) | INTRAVENOUS | Status: DC | PRN
  Administered 2019-07-24 – 2019-07-26 (×5): 10 mg via INTRAVENOUS
  Filled 2019-07-24 (×4): qty 10

## 2019-07-24 MED ORDER — OXYCODONE HCL 5 MG/5ML PO SOLN: 5.0000 mg | ORAL | Status: DC

## 2019-07-24 MED ORDER — VECURONIUM BROMIDE 10 MG IV SOLR
INTRAVENOUS | Status: AC
  Filled 2019-07-24: qty 10

## 2019-07-24 MED ORDER — CLONAZEPAM 0.1 MG/ML ORAL SUSPENSION
1.0000 mg | Freq: Two times a day (BID) | ORAL | Status: DC
  Filled 2019-07-24 (×2): qty 10

## 2019-07-24 MED ORDER — OXYCODONE HCL 5 MG/5ML PO SOLN
10.0000 mg | Freq: Three times a day (TID) | ORAL | Status: DC
  Administered 2019-07-24 – 2019-07-28 (×12): 10 mg
  Filled 2019-07-24 (×12): qty 10

## 2019-07-24 MED ORDER — QUETIAPINE FUMARATE 50 MG PO TABS
100.0000 mg | ORAL_TABLET | Freq: Two times a day (BID) | ORAL | Status: DC
  Administered 2019-07-24 – 2019-07-28 (×6): 100 mg
  Filled 2019-07-24 (×7): qty 2

## 2019-07-24 MED ORDER — NOREPINEPHRINE 16 MG/250ML-% IV SOLN
0.0000 ug/min | INTRAVENOUS | Status: DC
  Administered 2019-07-24: 4 ug/min via INTRAVENOUS
  Administered 2019-07-25: 5 ug/min via INTRAVENOUS
  Administered 2019-07-26: 15 ug/min via INTRAVENOUS
  Administered 2019-07-27: 20 ug/min via INTRAVENOUS
  Administered 2019-07-28: 15 ug/min via INTRAVENOUS
  Filled 2019-07-24 (×6): qty 250

## 2019-07-24 NOTE — Progress Notes (Signed)
Patient ID: Craig Mccoy, male   DOB: 07/19/1956, 63 y.o.   MRN: [id number] S: Still requiring high flow oxygen  O:BP 115/63   Pulse 97   Temp 97.7 F (36.5 C)   Resp (!) 27   Ht '5\' 6"'$  (1.676 m)   Wt 84.7 kg   SpO2 99%   BMI 30.14 kg/m   Intake/Output Summary (Last 24 hours) at 07/24/2019 1348 Last data filed at 07/24/2019 1300 Gross per 24 hour  Intake 2395.66 ml  Output 3871 ml  Net -1475.34 ml   Intake/Output: I/O last 3 completed shifts: In: 3912.9 [I.V.:1482.8; Other:10; NG/GT:1920; IV Piggyback:500.1] Out: 1497 [Urine:170; Emesis/NG output:279; WYOVZ:8588; FOYDX:4128]  Intake/Output this shift:  Total I/O In: 569 [I.V.:229.1; NG/GT:240; IV Piggyback:99.9] Out: 784 [Other:784] Weight change: -1.7 kg Physical exam: unable to complete due to COVID + status.  In order to preserve PPE equipment and to minimize exposure to providers.  Notes from other caregivers reviewed   Recent Labs  Lab 07/19/19 0324  07/21/19 0337  07/21/19 1535 07/22/19 0351 07/22/19 0419 07/22/19 0420 07/22/19 1556 07/23/19 0330 07/23/19 0340 07/23/19 0341 07/23/19 1631 07/24/19 0427  NA 138   < > 139   < > 137 137 136  --  138 137 137  --  136 138  K 5.6*   < > 5.2*   < > 5.4* 4.9 4.9  --  5.0 5.0 5.0  --  5.2* 4.5  CL 104   < > 105  --  104  --  104  --  105  --  106  --  105 103  CO2 24   < > 27  --  25  --  25  --  27  --  24  --  23 27  GLUCOSE 312*   < > 163*  --  273*  --  183*  --  208*  --  113*  --  150* 125*  BUN 158*   < > 60*  --  53*  --  50*  --  46*  --  45*  --  42* 38*  CREATININE 3.59*   < > 1.59*  --  1.47*  --  1.24  --  1.37*  --  1.43*  --  1.48* 1.50*  ALBUMIN 1.4*  1.4*   < > 1.6*  1.6*  --  1.8*  --  1.8* 1.9* 2.0*  --  1.9* 1.9* 2.0* 2.1*  2.1*  CALCIUM 7.8*   < > 7.9*  --  7.9*  --  7.8*  --  8.0*  --  7.8*  --  7.7* 8.1*  PHOS 8.7*   < > 3.6  --  3.1  --  4.0  --  5.7*  --  4.2  --  4.3 4.6  AST 50*  --  73*  --   --   --   --  118*  --   --   --  62*  --   40  ALT 56*  --  97*  --   --   --   --  180*  --   --   --  142*  --  98*   < > = values in this interval not displayed.   Liver Function Tests: Recent Labs  Lab 07/22/19 0420  07/23/19 0341 07/23/19 1631 07/24/19 0427  AST 118*  --  62*  --  40  ALT 180*  --  142*  --  98*  ALKPHOS 178*  --  181*  --  196*  BILITOT 0.5  --  0.5  --  0.5  PROT 5.9*  --  5.6*  --  5.8*  ALBUMIN 1.9*   < > 1.9* 2.0* 2.1*  2.1*   < > = values in this interval not displayed.   No results for input(s): LIPASE, AMYLASE in the last 168 hours. No results for input(s): AMMONIA in the last 168 hours. CBC: Recent Labs  Lab 07/18/19 0455  07/20/19 0401 07/21/19 0337  07/22/19 0420 07/23/19 0330 07/23/19 0341 07/24/19 0427  WBC 9.4   < > 4.7 4.4  --  5.2  --  7.7 10.1  NEUTROABS 8.3*  --   --   --   --   --   --   --   --   HGB 10.7*   < > 8.9* 9.7*   < > 10.5* 11.2* 10.4* 9.9*  HCT 35.3*   < > 28.0* 31.7*   < > 34.4* 33.0* 34.7* 34.0*  MCV 77.2*   < > 74.7* 76.0*  --  76.8*  --  78.3* 81.0  PLT 276   < > 229 203  --  196  --  197 175   < > = values in this interval not displayed.   Cardiac Enzymes: No results for input(s): CKTOTAL, CKMB, CKMBINDEX, TROPONINI in the last 168 hours. CBG: Recent Labs  Lab 07/23/19 2101 07/23/19 2327 07/24/19 0313 07/24/19 0817 07/24/19 1119  GLUCAP 125* 104* 104* 136* 176*    Iron Studies: No results for input(s): IRON, TIBC, TRANSFERRIN, FERRITIN in the last 72 hours. Studies/Results: No results found. Marland Kitchen acetylcysteine  4 mL Nebulization Once  . artificial tears  1 application Both Eyes I9S  . ascorbic acid  250 mg Per Tube Daily  . aspirin  81 mg Per Tube Daily  . chlorhexidine gluconate (MEDLINE KIT)  15 mL Mouth Rinse BID  . Chlorhexidine Gluconate Cloth  6 each Topical Daily  . clonazepam  1 mg Per Tube BID  . enoxaparin (LOVENOX) injection  0.8 mg/kg Subcutaneous Q24H  . famotidine  20 mg Per Tube Q24H  . feeding supplement (PRO-STAT SUGAR  FREE 64)  60 mL Per Tube TID  .  HYDROmorphone (DILAUDID) injection  1 mg Intravenous Once  . insulin aspart  0-20 Units Subcutaneous Q4H  . insulin detemir  30 Units Subcutaneous BID  . mouth rinse  15 mL Mouth Rinse 10 times per day  . oxyCODONE  10 mg Per Tube Q8H  . pravastatin  10 mg Per Tube Daily  . QUEtiapine  100 mg Per Tube BID  . sodium chloride flush  10-40 mL Intracatheter Q12H  . zinc sulfate  220 mg Per Tube Daily    BMET    Component Value Date/Time   NA 138 07/24/2019 0427   K 4.5 07/24/2019 0427   CL 103 07/24/2019 0427   CO2 27 07/24/2019 0427   GLUCOSE 125 (H) 07/24/2019 0427   BUN 38 (H) 07/24/2019 0427   CREATININE 1.50 (H) 07/24/2019 0427   CALCIUM 8.1 (L) 07/24/2019 0427   GFRNONAA 49 (L) 07/24/2019 0427   GFRAA 56 (L) 07/24/2019 0427   CBC    Component Value Date/Time   WBC 10.1 07/24/2019 0427   RBC 4.20 (L) 07/24/2019 0427   HGB 9.9 (L) 07/24/2019 0427   HCT 34.0 (L) 07/24/2019 0427   PLT 175 07/24/2019 0427   MCV 81.0  07/24/2019 0427   MCH 23.6 (L) 07/24/2019 0427   MCHC 29.1 (L) 07/24/2019 0427   RDW 19.7 (H) 07/24/2019 0427   LYMPHSABS 0.2 (L) 07/18/2019 0455   MONOABS 0.3 07/18/2019 0455   EOSABS 0.0 07/18/2019 0455   BASOSABS 0.0 07/18/2019 0455     Assessment/Plan:  1. AKI- unclear baseline presented with covid PNA and sepsis with BUN/Cr of 219/14.63. Started CRRT 11/17-11/18 with initial improvement of renal function but worsened and restarted CRRT 07/19/19. 2. Covid-19 PNA with sepsis and ARDS- s/p remdesivir, decadron per PCCM 3. VDRF with ARDS- attempt to lower FiO2 per PCCM 4. HCAP- treated for moraxella, off vanc and now on zosyn 5. 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 On levophed 6. Anemia of critical illness- transfuse prn 7. Vascular access- untunneled HD catheter in place for 12 days.  Will need new access if we are going to continue with aggressive measures.  Will discuss with  PCCM 8. Disposition- poor overall prognosis.  Currently DNR and will need ongoing discussions with family as he has not significantly improved despite maximal efforts.  Donetta Potts, MD [employer] [phone]

## 2019-07-24 NOTE — Progress Notes (Addendum)
NAME:  Craig Mccoy, MRN:  [id number], DOB:  [birth year], LOS: 60 ADMISSION DATE:  07/18/2019, CONSULTATION DATE:  11/17 REFERRING MD:  Craig Mccoy, CHIEF COMPLAINT:  Acute respiratory failure   Brief History   Craig Mccoy is a 63 yo male, COVID 19 positive, who was transferred from [facility] to [facility] for iHD.   History of present illness   Craig Mccoy including but not limited to hypertension and prior MI per chart review (see "past medical history" for rest). Hepresented to [name] 11/17 with dyspnea and respiratory distress. He had apparently tested positive for COVID 19 on 11/12 at an outside institution and had been in self isolation. His "lady friend" called EMS 11/17 due to above symptoms.  On EMS arrival, SpO2 was low despite supplemental O2. In ED, it was 77% before being placed on 15L NRB with improvement to 93%. Unfortunately, he deteriorated and required intubation. Repeat COVID testing returned as positive. He was also found to have multiple metabolic derangements including marked renal failure (SCr 14.63 with BUN 219), met acidosis + resp alkalosis (7.28, <19, 72), PCT 25.  Due to his potential to require iHD, he was transferred to [facility] for further evaluation and management.   Past Medical History  MI, HTN  Significant Hospital Events   11/17 - transferred to [facility] from [facility]  11/19 - possible seizure like activity witnessed by RN 11/25 CRRT, proning 11/26 no proning 11/27 plugged off left mainstem requiring urgent bronch, switch off paralytics, switch fentanyl to dilaudid + ketamine 11/28 proning, re-paralyzed, inability to ventilate despite all aggressive measures, 2 physician DNR 11/29 survived night, mildly improved ventilation 11/30 increasing pressor requirements, 80% FiO2, High PP  Consults:  Nephrology Neurology  Procedures/Tests/Lines:  ETT 11/17.  R femoral CVC 07/08/2019 R IJ HD catheter 07/09/2019 Rectal tube 07/20/19 Cortrak  07/19/19 R radial arterial line 07/18/19  cEEG 11/20 - 11/22 - diffuse encephalopathy. No seizures.  CXR 11/28 ARDS Echo 11/18 EF 35% LE duplex 11/20 no DVT CT head - no acute abnormality MRI brain 11/21 No acute abnormality   Micro Data:  Resp culture 11/28>> rare GPC, reincubated for better growth Blood Cx - 11/17 - NG4D  Respiratory Cx 11/17 - moraxella catarrhalis Urine Cx 11/17 - no growth MRSA pcr - negative COVID 11/17 - positive  Antimicrobials:  CTX - 11/18 x 5 days Azithro - complete Remdesivir 11/17 >> 11/21 Actemra 11/24 Vancomycin 11/28>> Cefepime 11/28>>  Interim history/subjective:  High peak and PP. 80% FiO2.  Remains on pressors.   Objective   Blood pressure (!) 107/57, pulse (!) 102, temperature 98.6 F (37 C), resp. rate (!) 25, height '5\' 6"'$  (1.676 m), weight 84.7 kg, SpO2 97 %.    Vent Mode: Other (Comment) FiO2 (%):  [80 %-100 %] 80 % Set Rate:  [30 bmp] 30 bmp Vt Set:  [400 mL] 400 mL PEEP:  [14 cmH20] 14 cmH20 Plateau Pressure:  [35 cmH20] 35 cmH20   Intake/Output Summary (Last 24 hours) at 07/24/2019 0823 Last data filed at 07/24/2019 0800 Gross per 24 hour  Intake 2535.23 ml  Output 3841 ml  Net -1305.77 ml   Filed Weights   07/22/19 0500 07/23/19 0345 07/24/19 0335  Weight: 85.8 kg 86.4 kg 84.7 kg    Examination: General: Well-developed, critically ill appearing male. NAD HENT: Normocephalic, PERRL-sluggish. Moist mucus membranes Neck: No JVD. Trachea midline.  CV: RRR. S1S2. No MRG. +2 distal pulses Lungs: BBS faint throughout with poor  excursion. No rhonchi or wheezing. Symmetrical, vent supported  ABD: +BS x4. SNT/ND. No masses, guarding or rigidity GU: Foley. FMS with brown liquid stool  EXT: Trace edema to feet  Skin: PWD. In tact. No rashes or lesions Neuro: RASS -5    Resolved Hospital Problem list     Assessment & Plan:  # Acute respiratory failure with hypoxia requiring mechanical ventilation due to COVID-19, m  catarrhalis PNA -  s/p azithro (11/18-11/21) and CTX (11/17 - 11/21).   Also s/p remdesivir, actemra x 1, steroids x 10 days.  Continued Secretions/O2 needs concerning for VAP.  Now hypotensive with increasing pressor requirements likely d/t refractory hypoxemia and possible sepsis d/t VAP. WBC normal but increasing.  # Rising D dimer- no on full AC # Acute encephalopathy - one episode of seizure like activity on 11/18. No further episodes seen. EEG did not show epileptiform activity, but did show diffuse encephalopathy. Neurology following, recommends holding anti-epileptics for now. MRI brain 11/21 No acute abnormality. etiology CNS depressing meds vs hypoactive delirium vs COVID-related encephalopathy  # Acute heart failure- some combination COVID myocarditis, shock cardiomyopathy. # Acute renal failure- related to acute illness, question of cardiorenal syndrome # Hyperglycemia- currently under control # Anemia-no Sn/Sx bleeding. Likely of critical illness  -Continue ventilator support to prevent eminent deterioration and further organ dysfunction from hypoxemia and hypercarbia utilizing ARDS guidelines.   Patient is at risk for sudden hypoxia, barotrauma and hemodynamic compromise.   Maintain SpO2 greater than or equal to 85%. Head of bed elevated 30 degrees. Goal Plateau pressures less than 30 cm H20.  Follow chest x-ray, ABG.   SAT/SBT as tolerated. Bronchial hygiene. RT/bronchodilator protocol.  - Keep even with CRRT, appreciate nephro help - Steroids, remdesivir completed - Continue lovenox full dose CRRT dosing with anti-Xa monitoring after 3rd dose, appreciate pharm help with this - per review he has not tolerated prone positioning.  - If he requires further paralytics likely out of options. He seems to have settled out with benzo/ketamine/dilaudid drips - Continue vanc/cefepime for now, f/u sputum culture data -titrate pressors for MAP goal 65  -follow CBC, SnSx bleeding - Poor  prognosis, DNR but will continue everything else for now  Best practice:  Diet: TF Pain/Anxiety/Delirium protocol (if indicated): PAD protocol VAP protocol (if indicated): in place DVT prophylaxis: lovenox, f/u anti-Xa level  GI prophylaxis: pepcid Glucose control: levemir w/ rSSI Mobility: Bedrest Code Status: DNR Family Communication:updated daughter Craig Mccoy by phone.  Disposition: ICU   Labs/Imaging reviewed in EMR.      The patient is critically ill with ARDS/respiratory failure, renal failure, hypotension. He requires ongoing ICU for high complexity decision making, titration of high alert medications, ventilator management, titration of oxygen and interpretation of advanced monitoring.    I personally spent 45 minutes providing critical care services including personally reviewing test results, discussing care with nursing staff/other physicians and completing orders pertaining to this patient.  Time was exclusive to the patient and does not include time spent teaching or in procedures.  Voice recognition software was used in the production of this record.  Errors in interpretation may have been inadvertently missed during review.  Francine Graven, MSN, AGACNP  [facility]

## 2019-07-24 NOTE — Progress Notes (Signed)
RT note- based on last ABG, fio2 attempted to decrease, now at 80%, continue to monitor

## 2019-07-24 NOTE — Progress Notes (Signed)
Spoke with patients children and updated them on status.  Scheduled a video conference for tomorrow with Dr. Elsworth Soho at 1500.

## 2019-07-24 NOTE — Progress Notes (Signed)
Pharmacy Anticoagulation Note  Craig Mccoy is a 63 y.o. male admitted on 07/18/2019 with COVID and now with increased O2 demands and secretions concerning for VAP.    The patient is in AKI on CRRT and appears to be tolerating with minimal off time.   Dr. Tamala Julian also requested full dose Lovenox at a reduced dose of 0.8 mg/kg given AKI requiring CRRT with pharmacy assistance in adjusting the dose to maintain an anti-Xa level of 0.5-1. Will check a level at steady state and make adjustments as needed.  lvl this afternoon was 1.05 - slightly above the goal of 0.5 -1  Plan: Continue current enoxaparin dose  Height: 5\' 6"  (167.6 cm) Weight: 186 lb 11.7 oz (84.7 kg) IBW/kg (Calculated) : 63.8  Temp (24hrs), Avg:97.5 F (36.4 C), Min:96.1 F (35.6 C), Max:98.8 F (37.1 C)  Recent Labs  Lab 07/20/19 0401  07/21/19 0337  07/22/19 0419 07/22/19 0420 07/22/19 1556 07/23/19 0340 07/23/19 0341 07/23/19 1631 07/24/19 0427  WBC 4.7  --  4.4  --   --  5.2  --   --  7.7  --  10.1  CREATININE 2.28*   < > 1.59*   < > 1.24  --  1.37* 1.43*  --  1.48* 1.50*   < > = values in this interval not displayed.    Estimated Creatinine Clearance: 50.8 mL/min (A) (by C-G formula based on SCr of 1.5 mg/dL (H)).    No Known Allergies  Barth Kirks, PharmD, BCPS, BCCCP Clinical Pharmacist [phone]  Please check AMION for all [place] numbers  07/24/2019 3:44 PM

## 2019-07-24 NOTE — Progress Notes (Signed)
Nutrition Follow-up  DOCUMENTATION CODES:   Not applicable  INTERVENTION:   Continue TF via Cortrak tube:   Nepro at 40 ml/h.  Pro-stat 60 ml TID.  Provides 2328 kcal, 168 gm protein, 698 ml free water daily.  NUTRITION DIAGNOSIS:   Increased nutrient needs related to acute illness(COVID PNA) as evidenced by estimated needs.  Ongoing  GOAL:   Patient will meet greater than or equal to 90% of their needs  Met with TF  MONITOR:   Vent status, Labs, I & O's  ASSESSMENT:   63 yo male admitted with COVID PNA, AKI. PMH includes HTN, MI.   Cortrak tube placed 11/23, tip in the stomach. Patient did not tolerate proning last week. No longer proning. Currently on CRRT.  TF was changed to Nepro at 40 ml/h on 11/28 with Pro-stat 60 ml TID, tolerating well. Providing 2328 kcal, 168 gm protein, 698 ml free water flushes. Free water flushes d/c'ed.  Patient remains intubated on ventilator support. Patient is now DNR.  MV: 12.1 L/min Temp (24hrs), Avg:97.5 F (36.4 C), Min:96.1 F (35.6 C), Max:98.8 F (37.1 C)  Propofol: off  Labs reviewed. BUN 38 (H), creatinine 1.5 (H), phosphorus 4.6 WDL, potassium 4.5 WDL, magnesium 2.7 (H) CBG's: 104-136-176  Medications reviewed and include vitamin C, novolog, levemir, zinc sulfate, ketamine, levophed.   Weight up by 7.2 kg since admission I/O +11.8 L since admission  Diet Order:   Diet Order            Diet NPO time specified  Diet effective now              EDUCATION NEEDS:   Not appropriate for education at this time  Skin:  Skin Assessment: Reviewed RN Assessment  Last BM:  11/30 (type 7)  Height:   Ht Readings from Last 1 Encounters:  07/19/19 5' 6" (1.676 m)    Weight:   Wt Readings from Last 1 Encounters:  07/24/19 84.7 kg   Admission weight 80 kg (11/17) BMI=28.5  Ideal Body Weight:  64.5 kg  BMI:  Body mass index is 30.14 kg/m.  Estimated Nutritional Needs:   Kcal:  2400  Protein:   130-160 gm  Fluid:  >/= 2 L     [place], RD, LDN, CNSC Pager [phone] After Hours Pager [phone]  

## 2019-07-25 ENCOUNTER — Inpatient Hospital Stay (HOSPITAL_COMMUNITY): Payer: BC Managed Care – PPO

## 2019-07-25 DIAGNOSIS — R6521 Severe sepsis with septic shock: Secondary | ICD-10-CM

## 2019-07-25 DIAGNOSIS — J80 Acute respiratory distress syndrome: Secondary | ICD-10-CM | POA: Diagnosis not present

## 2019-07-25 DIAGNOSIS — A419 Sepsis, unspecified organism: Secondary | ICD-10-CM | POA: Diagnosis not present

## 2019-07-25 DIAGNOSIS — N179 Acute kidney failure, unspecified: Secondary | ICD-10-CM | POA: Diagnosis not present

## 2019-07-25 DIAGNOSIS — U071 COVID-19: Secondary | ICD-10-CM | POA: Diagnosis not present

## 2019-07-25 LAB — BASIC METABOLIC PANEL
Anion gap: 10 (ref 5–15)
BUN: 36 mg/dL — ABNORMAL HIGH (ref 8–23)
CO2: 24 mmol/L (ref 22–32)
Calcium: 8.2 mg/dL — ABNORMAL LOW (ref 8.9–10.3)
Chloride: 102 mmol/L (ref 98–111)
Creatinine, Ser: 1.52 mg/dL — ABNORMAL HIGH (ref 0.61–1.24)
GFR calc Af Amer: 55 mL/min — ABNORMAL LOW (ref >60)
GFR calc non Af Amer: 48 mL/min — ABNORMAL LOW (ref >60)
Glucose, Bld: 175 mg/dL — ABNORMAL HIGH (ref 70–99)
Potassium: 3.5 mmol/L (ref 3.5–5.1)
Sodium: 136 mmol/L (ref 135–145)

## 2019-07-25 LAB — MAGNESIUM: Magnesium: 2.7 mg/dL — ABNORMAL HIGH (ref 1.7–2.4)

## 2019-07-25 LAB — HEPATIC FUNCTION PANEL
ALT: 74 U/L — ABNORMAL HIGH (ref 0–44)
AST: 31 U/L (ref 15–41)
Albumin: 2.3 g/dL — ABNORMAL LOW (ref 3.5–5.0)
Alkaline Phosphatase: 203 U/L — ABNORMAL HIGH (ref 38–126)
Bilirubin, Direct: <0.1 mg/dL (ref 0.0–0.2)
Total Bilirubin: 0.4 mg/dL (ref 0.3–1.2)
Total Protein: 6 g/dL — ABNORMAL LOW (ref 6.5–8.1)

## 2019-07-25 LAB — RENAL FUNCTION PANEL
Albumin: 2.3 g/dL — ABNORMAL LOW (ref 3.5–5.0)
Albumin: 2.2 g/dL — ABNORMAL LOW (ref 3.5–5.0)
Anion gap: 11 (ref 5–15)
Anion gap: 10 (ref 5–15)
BUN: 33 mg/dL — ABNORMAL HIGH (ref 8–23)
BUN: 38 mg/dL — ABNORMAL HIGH (ref 8–23)
CO2: 24 mmol/L (ref 22–32)
CO2: 24 mmol/L (ref 22–32)
Calcium: 8.4 mg/dL — ABNORMAL LOW (ref 8.9–10.3)
Calcium: 8.2 mg/dL — ABNORMAL LOW (ref 8.9–10.3)
Chloride: 102 mmol/L (ref 98–111)
Chloride: 102 mmol/L (ref 98–111)
Creatinine, Ser: 1.61 mg/dL — ABNORMAL HIGH (ref 0.61–1.24)
Creatinine, Ser: 1.62 mg/dL — ABNORMAL HIGH (ref 0.61–1.24)
GFR calc Af Amer: 52 mL/min — ABNORMAL LOW (ref >60)
GFR calc Af Amer: 51 mL/min — ABNORMAL LOW (ref >60)
GFR calc non Af Amer: 45 mL/min — ABNORMAL LOW (ref >60)
GFR calc non Af Amer: 44 mL/min — ABNORMAL LOW (ref >60)
Glucose, Bld: 126 mg/dL — ABNORMAL HIGH (ref 70–99)
Glucose, Bld: 148 mg/dL — ABNORMAL HIGH (ref 70–99)
Phosphorus: 3.4 mg/dL (ref 2.5–4.6)
Phosphorus: 3.5 mg/dL (ref 2.5–4.6)
Potassium: 3.6 mmol/L (ref 3.5–5.1)
Potassium: 3.4 mmol/L — ABNORMAL LOW (ref 3.5–5.1)
Sodium: 137 mmol/L (ref 135–145)
Sodium: 136 mmol/L (ref 135–145)

## 2019-07-25 LAB — POCT I-STAT 7, (LYTES, BLD GAS, ICA,H+H)
Acid-base deficit: 1 mmol/L (ref 0.0–2.0)
Bicarbonate: 28.3 mmol/L — ABNORMAL HIGH (ref 20.0–28.0)
Calcium, Ion: 1.28 mmol/L (ref 1.15–1.40)
HCT: 31 % — ABNORMAL LOW (ref 39.0–52.0)
Hemoglobin: 10.5 g/dL — ABNORMAL LOW (ref 13.0–17.0)
O2 Saturation: 93 %
Patient temperature: 37.1
Potassium: 3.6 mmol/L (ref 3.5–5.1)
Sodium: 135 mmol/L (ref 135–145)
TCO2: 31 mmol/L (ref 22–32)
pCO2 arterial: 76.7 mmHg (ref 32.0–48.0)
pH, Arterial: 7.176 — CL (ref 7.350–7.450)
pO2, Arterial: 86 mmHg (ref 83.0–108.0)

## 2019-07-25 LAB — CBC
HCT: 32.2 % — ABNORMAL LOW (ref 39.0–52.0)
Hemoglobin: 9.6 g/dL — ABNORMAL LOW (ref 13.0–17.0)
MCH: 23.7 pg — ABNORMAL LOW (ref 26.0–34.0)
MCHC: 29.8 g/dL — ABNORMAL LOW (ref 30.0–36.0)
MCV: 79.5 fL — ABNORMAL LOW (ref 80.0–100.0)
Platelets: 173 10*3/uL (ref 150–400)
RBC: 4.05 MIL/uL — ABNORMAL LOW (ref 4.22–5.81)
RDW: 19.1 % — ABNORMAL HIGH (ref 11.5–15.5)
WBC: 13.7 10*3/uL — ABNORMAL HIGH (ref 4.0–10.5)
nRBC: 0.3 % — ABNORMAL HIGH (ref 0.0–0.2)

## 2019-07-25 LAB — GLUCOSE, CAPILLARY
Glucose-Capillary: 108 mg/dL — ABNORMAL HIGH (ref 70–99)
Glucose-Capillary: 123 mg/dL — ABNORMAL HIGH (ref 70–99)
Glucose-Capillary: 140 mg/dL — ABNORMAL HIGH (ref 70–99)
Glucose-Capillary: 150 mg/dL — ABNORMAL HIGH (ref 70–99)
Glucose-Capillary: 164 mg/dL — ABNORMAL HIGH (ref 70–99)
Glucose-Capillary: 184 mg/dL — ABNORMAL HIGH (ref 70–99)

## 2019-07-25 LAB — D-DIMER, QUANTITATIVE: D-Dimer, Quant: 4.67 ug/mL-FEU — ABNORMAL HIGH (ref 0.00–0.50)

## 2019-07-25 MED ORDER — PRISMASOL BGK 4/2.5 32-4-2.5 MEQ/L IV SOLN
INTRAVENOUS | Status: DC
  Administered 2019-07-25 – 2019-07-28 (×13): via INTRAVENOUS_CENTRAL
  Filled 2019-07-25 (×29): qty 5000

## 2019-07-25 NOTE — Progress Notes (Signed)
RT note- Patient asynchronous with vent, vecuronium being given at this time.

## 2019-07-25 NOTE — Progress Notes (Signed)
Patient ID: Craig Mccoy, male   DOB: [birth year], 63 y.o.   MRN: [id number] S: Dr. Elsworth Soho to have video conference with family today at 3pm O:BP (!) 120/59   Pulse (!) 108   Temp 97.7 F (36.5 C)   Resp (!) 32   Ht _0  (1.676 m)   Wt 83.1 kg   SpO2 93%   BMI 29.57 kg/m   Intake/Output Summary (Last 24 hours) at 07/25/2019 1214 Last data filed at 07/25/2019 1100 Gross per 24 hour  Intake 1813.83 ml  Output 4309 ml  Net -2495.17 ml   Intake/Output: I/O last 3 completed shifts: In: 3114.8 [I.V.:1144.8; NG/GT:1670; IV Piggyback:299.9] Out: 5102 [Urine:135; Emesis/NG output:134; HENID:7824; Stool:1000]  Intake/Output this shift:  Total I/O In: 262.8 [I.V.:102.8; NG/GT:160] Out: 532 [Urine:15; Other:517] Weight change: -1.6 kg Physical exam: unable to complete due to COVID + status.  In order to preserve PPE equipment and to minimize exposure to providers.  Notes from other caregivers reviewed   Recent Labs  Lab 07/19/19 0324  07/21/19 0337  07/22/19 0419 07/22/19 0420 07/22/19 1556  07/23/19 0340 07/23/19 0341 07/23/19 1631 07/24/19 0427 07/24/19 1359 07/24/19 1600 07/24/19 2341 07/25/19 0438 07/25/19 0859  NA 138   < > 139   < > 136  --  138   < > 137  --  136 138 134* 136 136 137 135  K 5.6*   < > 5.2*   < > 4.9  --  5.0   < > 5.0  --  5.2* 4.5 3.9 4.0 3.5 3.6 3.6  CL 104   < > 105   < > 104  --  105  --  106  --  105 103  --  102 102 102  --   CO2 24   < > 27   < > 25  --  27  --  24  --  23 27  --  _1 --   GLUCOSE 312*   < > 163*   < > 183*  --  208*  --  113*  --  150* 125*  --  150* 175* 126*  --   BUN 158*   < > 60*   < > 50*  --  46*  --  45*  --  42* 38*  --  33* 36* 33*  --   CREATININE 3.59*   < > 1.59*   < > 1.24  --  1.37*  --  1.43*  --  1.48* 1.50*  --  1.49* 1.52* 1.61*  --   ALBUMIN 1.4*  1.4*   < > 1.6*  1.6*   < > 1.8* 1.9* 2.0*  --  1.9* 1.9* 2.0* 2.1*  2.1*  --  2.2*  --  2.3*  2.3*  --   CALCIUM 7.8*   < > 7.9*   < > 7.8*  --  8.0*  --   7.8*  --  7.7* 8.1*  --  8.1* 8.2* 8.4*  --   PHOS 8.7*   < > 3.6   < > 4.0  --  5.7*  --  4.2  --  4.3 4.6  --  4.0  --  3.4  --   AST 50*  --  73*  --   --  118*  --   --   --  62*  --  40  --   --   --  31  --  ALT 56*  --  97*  --   --  180*  --   --   --  142*  --  98*  --   --   --  74*  --    < > = values in this interval not displayed.   Liver Function Tests: Recent Labs  Lab 07/23/19 0341  07/24/19 0427 07/24/19 1600 07/25/19 0438  AST 62*  --  40  --  31  ALT 142*  --  98*  --  74*  ALKPHOS 181*  --  196*  --  203*  BILITOT 0.5  --  0.5  --  0.4  PROT 5.6*  --  5.8*  --  6.0*  ALBUMIN 1.9*   < > 2.1*  2.1* 2.2* 2.3*  2.3*   < > = values in this interval not displayed.   No results for input(s): LIPASE, AMYLASE in the last 168 hours. No results for input(s): AMMONIA in the last 168 hours. CBC: Recent Labs  Lab 07/21/19 0337  07/22/19 0420  07/23/19 0341 07/24/19 0427 07/24/19 1359 07/25/19 0438 07/25/19 0859  WBC 4.4  --  5.2  --  7.7 10.1  --  13.7*  --   HGB 9.7*   < > 10.5*   < > 10.4* 9.9* 10.9* 9.6* 10.5*  HCT 31.7*   < > 34.4*   < > 34.7* 34.0* 32.0* 32.2* 31.0*  MCV 76.0*  --  76.8*  --  78.3* 81.0  --  79.5*  --   PLT 203  --  196  --  197 175  --  173  --    < > = values in this interval not displayed.   Cardiac Enzymes: No results for input(s): CKTOTAL, CKMB, CKMBINDEX, TROPONINI in the last 168 hours. CBG: Recent Labs  Lab 07/24/19 2001 07/24/19 2338 07/25/19 0314 07/25/19 0811 07/25/19 1118  GLUCAP 125* 164* 150* 108* 184*    Iron Studies: No results for input(s): IRON, TIBC, TRANSFERRIN, FERRITIN in the last 72 hours. Studies/Results: Dg Chest Port 1 View  Result Date: 07/25/2019 CLINICAL DATA:  COVID-19. Readjustment of ET tube. EXAM: PORTABLE CHEST 1 VIEW COMPARISON:  07/22/2019 FINDINGS: There is a right IJ catheter with tip in the projection of the SVC. The ET tube tip is above the carina by 4 cm. There is a feeding tube with tip  below the level of the GE junction. Normal heart size. Asymmetric elevation of left hemidiaphragm as before. Small bilateral pleural effusions, unchanged. Bilateral interstitial and airspace opacities persist. Not significantly changed in the interval. IMPRESSION: 1. No change in bilateral interstitial and airspace opacities compatible with multifocal pneumonia. 2. Stable support apparatus. Electronically Signed   By: Kerby Moors M.D.   On: 07/25/2019 09:37   . acetylcysteine  4 mL Nebulization Once  . artificial tears  1 application Both Eyes F5D  . ascorbic acid  250 mg Per Tube Daily  . aspirin  81 mg Per Tube Daily  . chlorhexidine gluconate (MEDLINE KIT)  15 mL Mouth Rinse BID  . Chlorhexidine Gluconate Cloth  6 each Topical Daily  . clonazepam  1 mg Per Tube BID  . enoxaparin (LOVENOX) injection  0.8 mg/kg Subcutaneous Q24H  . famotidine  20 mg Per Tube Q24H  . feeding supplement (PRO-STAT SUGAR FREE 64)  60 mL Per Tube TID  .  HYDROmorphone (DILAUDID) injection  1 mg Intravenous Once  . insulin aspart  0-20 Units Subcutaneous Q4H  .  insulin detemir  30 Units Subcutaneous BID  . mouth rinse  15 mL Mouth Rinse 10 times per day  . oxyCODONE  10 mg Per Tube Q8H  . pravastatin  10 mg Per Tube Daily  . QUEtiapine  100 mg Per Tube BID  . sodium chloride flush  10-40 mL Intracatheter Q12H  . zinc sulfate  220 mg Per Tube Daily    BMET    Component Value Date/Time   NA 135 07/25/2019 0859   K 3.6 07/25/2019 0859   CL 102 07/25/2019 0438   CO2 24 07/25/2019 0438   GLUCOSE 126 (H) 07/25/2019 0438   BUN 33 (H) 07/25/2019 0438   CREATININE 1.61 (H) 07/25/2019 0438   CALCIUM 8.4 (L) 07/25/2019 0438   GFRNONAA 45 (L) 07/25/2019 0438   GFRAA 52 (L) 07/25/2019 0438   CBC    Component Value Date/Time   WBC 13.7 (H) 07/25/2019 0438   RBC 4.05 (L) 07/25/2019 0438   HGB 10.5 (L) 07/25/2019 0859   HCT 31.0 (L) 07/25/2019 0859   PLT 173 07/25/2019 0438   MCV 79.5 (L) 07/25/2019 0438    MCH 23.7 (L) 07/25/2019 0438   MCHC 29.8 (L) 07/25/2019 0438   RDW 19.1 (H) 07/25/2019 0438   LYMPHSABS 0.2 (L) 07/18/2019 0455   MONOABS 0.3 07/18/2019 0455   EOSABS 0.0 07/18/2019 0455   BASOSABS 0.0 07/18/2019 0455     Assessment/Plan:  1. AKI- unclear baseline presented with covid PNA and sepsis with BUN/Cr of 219/14.63. Started CRRT 11/17-11/18 with initial improvement of renal function but worsened and restarted CRRT 07/19/19. 1. Will change dialysate solution to 4K/2.5Ca given hypokalemia 2. Continue with CRRT for now and await family decision regarding goals of care. 2. Covid-19 PNA with sepsis and ARDS- s/p remdesivir, decadron per PCCM 3. VDRF with ARDS- attempt to lower FiO2 per PCCM 4. HCAP- treated for moraxella, off vanc and now on zosyn 5. 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 On levophed 6. Anemia of critical illness- transfuse prn 7. Vascular access- untunneled HD catheter in place for 13 days.  Will need new access if we are going to continue with aggressive measures.  Will discuss with PCCM pending family meeting today. 8. Disposition- poor overall prognosis.  Currently DNR and will need ongoing discussions with family as he has not significantly improved despite maximal efforts. Donetta Potts, MD [employer] [phone]

## 2019-07-25 NOTE — Progress Notes (Signed)
Assisted tele visit to patient with family member.  Isley Zinni M, RN  

## 2019-07-25 NOTE — Progress Notes (Addendum)
NAME:  Craig Mccoy, MRN:  [id number], DOB:  06-19-1956, LOS: 69 ADMISSION DATE:  07/17/2019, CONSULTATION DATE:  11/17 REFERRING MD:  Rogene Houston, CHIEF COMPLAINT:  Acute respiratory failure    History of present illness   Craig Mccoy a 63 y.o.malepresented to [name] 11/17 with dyspnea and respiratory distress. He had apparently tested positive for COVID 19 on 11/12 at an outside institution and had been in self isolation. Had multiple metabolic derangements including marked renal failure (SCr 14.63 with BUN 219), met acidosis + resp alkalosis (7.28, <19, 72). Developed ARDS & required CRRT    Past Medical History  MI, HTN  Significant Hospital Events   11/17 - transferred to [facility] from [facility]  11/19 - possible seizure like activity witnessed by RN 11/25 CRRT, proning 11/26 no proning 11/27 plugged off left mainstem requiring urgent bronch, switch off paralytics, switch fentanyl to dilaudid + ketamine 11/28 proning, re-paralyzed, inability to ventilate despite all aggressive measures, 2 physician DNR 11/29 survived night, mildly improved ventilation 11/30 increasing pressor requirements, 80% FiO2, High PP  Consults:  Nephrology Neurology  Procedures/Tests/Lines:  ETT 11/17 >> R femoral CVC 06/28/2019 >> R IJ HD catheter 07/06/2019 >> Rectal tube 07/20/19 Cortrak 07/19/19 R radial arterial line 07/18/19  cEEG 11/20 - 11/22 - diffuse encephalopathy. No seizures.  CXR 11/28 ARDS Echo 11/18 EF 35% LE duplex 11/20 no DVT CT head - no acute abnormality MRI brain 11/21 No acute abnormality   Micro Data:  Resp culture 11/28>> rare GPC, reincubated for better growth Blood Cx - 11/17 - NG4D  Respiratory Cx 11/17 - moraxella catarrhalis Urine Cx 11/17 - no growth MRSA pcr - negative COVID 11/17 - positive  Antimicrobials:  CTX - 11/18 x 5 days Azithro - complete Remdesivir 11/17 >> 11/21 Actemra 11/24 Vancomycin 11/28>>11/30 Cefepime 11/28>>  Interim  history/subjective:   Remains critically ill, intubated, on 70%/PEEP of 14 Deeply sedated on ketamine, Versed and Dilaudid On CRRT, remains anuric  Objective   Blood pressure 102/65, pulse 99, temperature 98.2 F (36.8 C), resp. rate (!) 36, height 5' 6" (1.676 m), weight 83.1 kg, SpO2 97 %.    Vent Mode: Other (Comment) FiO2 (%):  [70 %-80 %] 70 % Set Rate:  [30 bmp] 30 bmp Vt Set:  [400 mL] 400 mL PEEP:  [14 cmH20] 14 cmH20 Plateau Pressure:  [22 cmH20] 22 cmH20   Intake/Output Summary (Last 24 hours) at 07/25/2019 1532 Last data filed at 07/25/2019 1500 Gross per 24 hour  Intake 1922.55 ml  Output 4310 ml  Net -2387.45 ml   Filed Weights   07/23/19 0345 07/24/19 0335 07/25/19 0316  Weight: 86.4 kg 84.7 kg 83.1 kg    Examination: General: Well-developed, critically ill appearing male. NAD HENT: Normocephalic, PERRL-sluggish. Moist mucus membranes Neck: No JVD. Trachea midline.  CV: RRR. S1S2. No MRG. +2 distal pulses Lungs: BBS faint throughout with poor excursion. No rhonchi or wheezing. Symmetrical, vent supported  ABD: +BS x4. SNT/ND. No masses, guarding or rigidity GU: Foley. FMS with brown liquid stool  EXT: Trace edema to feet  Skin: PWD. In tact. No rashes or lesions Neuro: RASS -5 on sedative drips  Chest x-ray 12/1 personally reviewed which shows bilateral interstitial multifocal infiltrates  Resolved Hospital Problem list     Assessment & Plan:  # ARDS due to COVID-19, m catarrhalis PNA -  s/p azithro (11/18-11/21) and CTX (11/17 - 11/21).   Also s/p remdesivir, actemra x 1, steroids x 10 days.  Continued  Secretions/O2 needs concerning for VAP.   -Continue low tidal volume ventilation -Revert to PRVC, maintain plateau less than 35, driving pressure less than 15 -Will not prone further unless hypoxia worsens. -Now on 70%/PEEP 14 -Allowing permissive hypercarbia to 7.20 range, may need to add bicarbonate drip  Shock-septic versus cardiogenic/acidosis  related Now hypotensive with increasing pressor requirements likely d/t refractory hypoxemia and possible sepsis d/t VAP. WBC normal but increasing.    # Rising D dimer-full dose Lovenox while on CRRT   # Acute encephalopathy - one episode of seizure like activity on 11/18. No further episodes seen. EEG did not show epileptiform activity, but did show diffuse encephalopathy. Neurology following. MRI brain 11/21 No acute abnormality.    --Maintain deep sedation since vecuronium being used intermittently for vent asynchrony -Trial of ketamine, maintain Versed and Dilaudid drips -Oxycodone, Seroquel and clonazepam have been added, will titrate to effect   # Acute heart failure- EF 35%, some combination COVID myocarditis, shock cardiomyopathy.   # AKI - related to acute illness, question of cardiorenal syndrome # Hyperglycemia-SSI, CBG goal less than 180 # Anemia-no Sn/Sx bleeding. Likely of critical illness  Lines will need to be changed at some point  Goals of care discussion with family 12/1-DNR issued, but they would like to continue current forms of life support including dialysis and mechanical ventilation to see if he has any response over the next few days.  Will involve palliative care for further discussion  Best practice:  Diet: TF Pain/Anxiety/Delirium protocol (if indicated): PAD protocol VAP protocol (if indicated): in place DVT prophylaxis: lovenox, f/u anti-Xa level  GI prophylaxis: pepcid Glucose control: levemir w/ rSSI Mobility: Bedrest Code Status: DNR Family Communication:updated daughter Craig Mccoy & son Craig Mccoy 12/1.  Disposition: ICU  The patient is critically ill with multiple organ systems failure and requires high complexity decision making for assessment and support, frequent evaluation and titration of therapies, application of advanced monitoring technologies and extensive interpretation of multiple databases. Critical Care Time devoted to patient care  services described in this note independent of APP/resident  time is 45 minutes.   Kara Mead MD. Shade Flood. [facility]  If no response to pager , please call [phone]   07/25/2019

## 2019-07-25 NOTE — Progress Notes (Signed)
RT note- ABG drawn and given to RN, Dr. Elsworth Soho called.

## 2019-07-25 DEATH — deceased

## 2019-07-26 DIAGNOSIS — J80 Acute respiratory distress syndrome: Secondary | ICD-10-CM | POA: Diagnosis not present

## 2019-07-26 DIAGNOSIS — G9340 Encephalopathy, unspecified: Secondary | ICD-10-CM | POA: Diagnosis not present

## 2019-07-26 DIAGNOSIS — Z7189 Other specified counseling: Secondary | ICD-10-CM

## 2019-07-26 DIAGNOSIS — U071 COVID-19: Secondary | ICD-10-CM | POA: Diagnosis not present

## 2019-07-26 DIAGNOSIS — Z515 Encounter for palliative care: Secondary | ICD-10-CM

## 2019-07-26 DIAGNOSIS — N179 Acute kidney failure, unspecified: Secondary | ICD-10-CM | POA: Diagnosis not present

## 2019-07-26 DIAGNOSIS — J9600 Acute respiratory failure, unspecified whether with hypoxia or hypercapnia: Secondary | ICD-10-CM

## 2019-07-26 LAB — CBC
HCT: 32.1 % — ABNORMAL LOW (ref 39.0–52.0)
Hemoglobin: 9.5 g/dL — ABNORMAL LOW (ref 13.0–17.0)
MCH: 23.8 pg — ABNORMAL LOW (ref 26.0–34.0)
MCHC: 29.6 g/dL — ABNORMAL LOW (ref 30.0–36.0)
MCV: 80.3 fL (ref 80.0–100.0)
Platelets: 184 10*3/uL (ref 150–400)
RBC: 4 MIL/uL — ABNORMAL LOW (ref 4.22–5.81)
RDW: 19.5 % — ABNORMAL HIGH (ref 11.5–15.5)
WBC: 14.1 10*3/uL — ABNORMAL HIGH (ref 4.0–10.5)
nRBC: 0.6 % — ABNORMAL HIGH (ref 0.0–0.2)

## 2019-07-26 LAB — BLOOD GAS, ARTERIAL
Acid-Base Excess: 0.8 mmol/L (ref 0.0–2.0)
Bicarbonate: 28.7 mmol/L — ABNORMAL HIGH (ref 20.0–28.0)
FIO2: 70
O2 Saturation: 98.6 %
Patient temperature: 36
pCO2 arterial: 78.6 mmHg (ref 32.0–48.0)
pH, Arterial: 7.18 — CL (ref 7.350–7.450)
pO2, Arterial: 140 mmHg — ABNORMAL HIGH (ref 83.0–108.0)

## 2019-07-26 LAB — POCT I-STAT 7, (LYTES, BLD GAS, ICA,H+H)
Acid-base deficit: 3 mmol/L — ABNORMAL HIGH (ref 0.0–2.0)
Bicarbonate: 26.7 mmol/L (ref 20.0–28.0)
Calcium, Ion: 1.28 mmol/L (ref 1.15–1.40)
HCT: 30 % — ABNORMAL LOW (ref 39.0–52.0)
Hemoglobin: 10.2 g/dL — ABNORMAL LOW (ref 13.0–17.0)
O2 Saturation: 97 %
Patient temperature: 34.5
Potassium: 3.6 mmol/L (ref 3.5–5.1)
Sodium: 135 mmol/L (ref 135–145)
TCO2: 29 mmol/L (ref 22–32)
pCO2 arterial: 70.5 mmHg (ref 32.0–48.0)
pH, Arterial: 7.171 — CL (ref 7.350–7.450)
pO2, Arterial: 105 mmHg (ref 83.0–108.0)

## 2019-07-26 LAB — RENAL FUNCTION PANEL
Albumin: 2.3 g/dL — ABNORMAL LOW (ref 3.5–5.0)
Albumin: 2.3 g/dL — ABNORMAL LOW (ref 3.5–5.0)
Anion gap: 7 (ref 5–15)
Anion gap: 6 (ref 5–15)
BUN: 36 mg/dL — ABNORMAL HIGH (ref 8–23)
BUN: 31 mg/dL — ABNORMAL HIGH (ref 8–23)
CO2: 26 mmol/L (ref 22–32)
CO2: 26 mmol/L (ref 22–32)
Calcium: 8.3 mg/dL — ABNORMAL LOW (ref 8.9–10.3)
Calcium: 8.3 mg/dL — ABNORMAL LOW (ref 8.9–10.3)
Chloride: 103 mmol/L (ref 98–111)
Chloride: 104 mmol/L (ref 98–111)
Creatinine, Ser: 1.39 mg/dL — ABNORMAL HIGH (ref 0.61–1.24)
Creatinine, Ser: 1.46 mg/dL — ABNORMAL HIGH (ref 0.61–1.24)
GFR calc Af Amer: >60 mL/min (ref >60)
GFR calc Af Amer: 58 mL/min — ABNORMAL LOW (ref >60)
GFR calc non Af Amer: 53 mL/min — ABNORMAL LOW (ref >60)
GFR calc non Af Amer: 50 mL/min — ABNORMAL LOW (ref >60)
Glucose, Bld: 149 mg/dL — ABNORMAL HIGH (ref 70–99)
Glucose, Bld: 106 mg/dL — ABNORMAL HIGH (ref 70–99)
Phosphorus: 3.5 mg/dL (ref 2.5–4.6)
Phosphorus: 3.2 mg/dL (ref 2.5–4.6)
Potassium: 3.6 mmol/L (ref 3.5–5.1)
Potassium: 3.9 mmol/L (ref 3.5–5.1)
Sodium: 136 mmol/L (ref 135–145)
Sodium: 136 mmol/L (ref 135–145)

## 2019-07-26 LAB — GLUCOSE, CAPILLARY
Glucose-Capillary: 109 mg/dL — ABNORMAL HIGH (ref 70–99)
Glucose-Capillary: 135 mg/dL — ABNORMAL HIGH (ref 70–99)
Glucose-Capillary: 139 mg/dL — ABNORMAL HIGH (ref 70–99)
Glucose-Capillary: 141 mg/dL — ABNORMAL HIGH (ref 70–99)
Glucose-Capillary: 154 mg/dL — ABNORMAL HIGH (ref 70–99)
Glucose-Capillary: 99 mg/dL (ref 70–99)

## 2019-07-26 LAB — MAGNESIUM: Magnesium: 2.7 mg/dL — ABNORMAL HIGH (ref 1.7–2.4)

## 2019-07-26 MED ORDER — SODIUM BICARBONATE 8.4 % IV SOLN
100.0000 meq | Freq: Once | INTRAVENOUS | Status: AC
  Administered 2019-07-26: 100 meq via INTRAVENOUS

## 2019-07-26 MED ORDER — STERILE WATER FOR INJECTION IV SOLN
INTRAVENOUS | Status: DC
  Administered 2019-07-26 – 2019-07-27 (×2): via INTRAVENOUS
  Filled 2019-07-26 (×4): qty 850

## 2019-07-26 MED ORDER — VECURONIUM BROMIDE 10 MG IV SOLR
10.0000 mg | Freq: Once | INTRAVENOUS | Status: AC
  Administered 2019-07-26: 10 mg via INTRAVENOUS
  Filled 2019-07-26: qty 10

## 2019-07-26 MED ORDER — SODIUM BICARBONATE 8.4 % IV SOLN
INTRAVENOUS | Status: AC
  Filled 2019-07-26: qty 100

## 2019-07-26 NOTE — Consult Note (Signed)
Consultation Note Date: 07/26/2019   Patient Name: Craig Mccoy  DOB: 02-Oct-1955  MRN: [id number]  Age / Sex: 63 y.o., male  PCP: Celene Squibb, MD Referring Physician: Candee Furbish, MD  Reason for Consultation: Establishing goals of care  HPI/Patient Profile: 63 y.o. male  with past medical history of gout, HTN, MI, admitted on 07/14/2019 with respiratory distress. Tested COVID+ prior to admission- was confirmed on admission. Recovery has been complicated by findings of heart failure- 35% on 11/18 ECHO, encephalopathy (EEG negative for seizure- showed profound diffuse encephalopathy- nonspecific to etiology), ARDS with onging vent dependence- not able to wean, asynchrony requiring repeat paralytics, renal and hepatic failure- now on CRRT. Also requiring IV pressors to maintain BP. Dr. Elsworth Soho had [place] discussion with family on 12/1 which resulted in agreement to DNR, Palliative consulted for further [place].   Clinical Assessment and Goals of Care:  I called patient's daughter- Ashely for [place] discussion.  Life review conducted- per Ashely "[place]" was independent prior to admission. He enjoyed visiting with his friends and drinking beer.  Caryl Pina notes that a few weeks ago he came to her and told her he was a "sick man". She believes that he had gone to his physical and had been told there were issues with his kidney and liver.  Illness trajectory and comorbidities and risk for mortality were discussed.  GOC and anticipated decision making was discussed.  Caryl Pina states she wonders if "God is working on him while he is in a coma" and is requesting more time to see if Jola Babinski will recover.  We discussed what Mr. Jallow would advise if he were able to see himself, and know that his future likely held ongoing medical care, hospitalizations, possible long term SNF vs LTACH (if even able to wean off ventilator) with very  little hope of returning to functional status that he was at prior to admission.  Caryl Pina does say that [place] would not want these things. His GOC would be to leave the hospital and be himself. She says he has always told her not to put him in a SNF.  Caryl Pina states her brother feels that patient should be transitioned to comfort measures and allowed to pass peacefully, but she worries that she will "go to hell for killing him" if this decision is made. We discussed the difference between killing someone, and stopping interventions that are not improving their status of quality of life when they are already dying. I gave Caryl Pina emotional support and let her know that her role is not technically as "decision maker" rather to communicate what she believes patient's preferences would be since patient cannot communicate his preferences to [place].  At close of discussion Caryl Pina was grateful for discussion. Agreed to followup from Palliative provider for continued GOC.  Primary Decision Maker NEXT OF KIN- patient's children- Caryl Pina, [place]    SUMMARY OF RECOMMENDATIONS -Continue current level of care -PMT will continue [place] discussions with patient's children    Code Status/Advance Care Planning:  DNR  Prognosis:    Unable to determine  Discharge Planning: To Be Determined  Primary Diagnoses: Present on Admission: . Pneumonia due to COVID-19 virus   I have reviewed the medical record, interviewed the patient and family, and examined the patient. The following aspects are pertinent.  Past Medical History:  Diagnosis Date  . Hypertension   . MI (myocardial infarction) [facility])    Social History   Socioeconomic History  . Marital status: Single    Spouse name: Not on file  . Number of children: Not on file  . Years of education: Not on file  . Highest education level: Not on file  Occupational History  . Not on file  Social Needs  . Financial resource strain: Not on file  . Food  insecurity    Worry: Not on file    Inability: Not on file  . Transportation needs    Medical: Not on file    Non-medical: Not on file  Tobacco Use  . Smoking status: Never Smoker  . Smokeless tobacco: Never Used  Substance and Sexual Activity  . Alcohol use: Yes    Comment: 4-5 beers  a day  . 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 Sexual activity: Not on file  Lifestyle  . Physical activity    Days per week: Not on file    Minutes per session: Not on file  . Stress: Not on file  Relationships  . Social Herbalist on phone: Not on file    Gets together: Not on file    Attends religious service: Not on file    Active member of club or organization: Not on file    Attends meetings of clubs or organizations: Not on file    Relationship status: Not on file  Other Topics Concern  . Not on file  Social History Narrative  . Not on file   Family History  Problem Relation Age of Onset  . Hypertension Mother   . Hypertension Father    Scheduled Meds: . ascorbic acid  250 mg Per Tube Daily  . aspirin  81 mg Per Tube Daily  . chlorhexidine gluconate (MEDLINE KIT)  15 mL Mouth Rinse BID  . Chlorhexidine Gluconate Cloth  6 each Topical Daily  . clonazepam  1 mg Per Tube BID  . enoxaparin (LOVENOX) injection  0.8 mg/kg Subcutaneous Q24H  . famotidine  20 mg Per Tube Q24H  . feeding supplement (PRO-STAT SUGAR FREE 64)  60 mL Per Tube TID  .  HYDROmorphone (DILAUDID) injection  1 mg Intravenous Once  . insulin aspart  0-20 Units Subcutaneous Q4H  . insulin detemir  30 Units Subcutaneous BID  . mouth rinse  15 mL Mouth Rinse 10 times per day  . oxyCODONE  10 mg Per Tube Q8H  . pravastatin  10 mg Per Tube Daily  . QUEtiapine  100 mg Per Tube BID  . sodium chloride flush  10-40 mL Intracatheter Q12H  . zinc sulfate  220 mg Per Tube Daily   Continuous Infusions: .  prismasol BGK 4/2.5 500 mL/hr at 07/26/19 1213  .  prismasol BGK 4/2.5  300 mL/hr at 07/25/19 0811  . sodium chloride Stopped (07/23/19 1445)  . ceFEPime (MAXIPIME) IV 2 g (07/26/19 1026)  . feeding supplement (NEPRO CARB STEADY) 40 mL/hr at 07/26/19 0700  . HYDROmorphone 2 mg/hr (07/26/19 1600)  . midazolam 4 mg/hr (07/26/19 1600)  .  norepinephrine (LEVOPHED) Adult infusion 20 mcg/min (07/26/19 1600)  . prismasol BGK 4/2.5 1,500 mL/hr at 07/26/19 1545  .  sodium bicarbonate (isotonic) infusion in sterile water 50 mL/hr at 07/26/19 1600   PRN Meds:.Place/Maintain arterial line **AND** sodium chloride, acetaminophen (TYLENOL) oral liquid 160 mg/5 mL, albuterol, bisacodyl, docusate, heparin, HYDROmorphone, labetalol, midazolam, sodium chloride flush Medications Prior to Admission:  Prior to Admission medications   Medication Sig Start Date End Date Taking? Authorizing Provider  benzonatate (TESSALON) 100 MG capsule Take 100-200 mg by mouth 3 (three) times daily as needed for cough.   Yes [provider]  EDARBYCLOR 40-12.5 MG TABS Take 1 tablet by mouth daily. 06/01/19  Yes [provider]  pravastatin (PRAVACHOL) 10 MG tablet Take 10 mg by mouth daily. 05/03/19  Yes [provider]  sildenafil (VIAGRA) 50 MG tablet Take 50 mg by mouth daily as needed for erectile dysfunction.   Yes [provider]   No Known Allergies Review of Systems  Unable to perform ROS: Acuity of condition    Physical Exam Vitals signs and nursing note reviewed.   Pt observed outside room. Non responsive, vent dependent. Physical exam deferred due to COVID restrictions, and the need to preserve PE. Chart was thoroughly reviewed and patient was discussed with nursing staff.   Vital Signs: BP (!) 116/47   Pulse (!) 101   Temp (!) 97.5 F (36.4 C)   Resp (!) 30   Ht '5\' 6"'$  (1.676 m)   Wt 83.5 kg   SpO2 100%   BMI 29.71 kg/m  Pain Scale: CPOT   Pain Score: 0-No pain   SpO2: SpO2: 100 % O2 Device:SpO2: 100 % O2 Flow Rate: .O2 Flow Rate  (L/min): 15 L/min  IO: Intake/output summary:   Intake/Output Summary (Last 24 hours) at 07/26/2019 1652 Last data filed at 07/26/2019 1600 Gross per 24 hour  Intake 2032.48 ml  Output 2325 ml  Net -292.52 ml    LBM: Last BM Date: 07/25/19 Baseline Weight: Weight: 81.6 kg Most recent weight: Weight: 83.5 kg     Palliative Assessment/Data: PPS: 10%     Thank you for this consult. Palliative medicine will continue to follow and assist as needed.   The above conversation was completed via telephone due to the visitor restrictions during the COVID-19 pandemic. Thorough chart review and discussion with necessary members of the care team was completed as part of assessment. All issues were discussed and addressed but no physical exam was performed.   Time In: 1545 Time Out: 1710 Time Total: 85 minutes Prolonged services billed: yes Greater than 50%  of this time was spent counseling and coordinating care related to the above assessment and plan.  Signed by: Mariana Kaufman, AGNP-C Palliative Medicine    Please contact Palliative Medicine Team phone at [phone] for questions and concerns.  For individual provider: See Shea Evans

## 2019-07-26 NOTE — Progress Notes (Signed)
[facility] Progress Note Patient Name: Craig Mccoy DOB: 05/08/1956 MRN: [id number]   Date of Service  07/26/2019  HPI/Events of Note  Request for ABG - Last pH = 7.171  eICU Interventions  Will order: 1. ABG STAT.     Intervention Category Major Interventions: Respiratory failure - evaluation and management;Acid-Base disturbance - evaluation and management  Sommer,Steven Eugene 07/26/2019, 8:18 PM

## 2019-07-26 NOTE — Progress Notes (Signed)
NAME:  Craig Mccoy, MRN:  [id number], DOB:  1956/07/05, LOS: 15 ADMISSION DATE:  07/24/2019, CONSULTATION DATE:  11/17 REFERRING MD:  Rogene Houston, CHIEF COMPLAINT:  Acute respiratory failure    History of present illness   Cy Bresee a 63 y.o.malepresented to [name] 11/17 with dyspnea and respiratory distress. He had apparently tested positive for COVID 19 on 11/12 at an outside institution and had been in self isolation. Had multiple metabolic derangements including marked renal failure (SCr 14.63 with BUN 219), met acidosis + resp alkalosis (7.28, <19, 72). Developed ARDS & required CRRT    Past Medical History  MI, HTN  Significant Hospital Events   11/17 - transferred to [facility] from [facility]  11/19 - possible seizure like activity witnessed by RN 11/25 CRRT, proning 11/26 no proning 11/27 plugged off left mainstem requiring urgent bronch, switch off paralytics, switch fentanyl to dilaudid + ketamine 11/28 proning, re-paralyzed, inability to ventilate despite all aggressive measures, 2 physician DNR 11/29 survived night, mildly improved ventilation 11/30 increasing pressor requirements, 80% FiO2, High PP  Consults:  Nephrology Neurology  Procedures/Tests/Lines:  ETT 11/17 >> R femoral CVC 07/24/2019 >> R IJ HD catheter 07/02/2019 >> Rectal tube 07/20/19 Cortrak 07/19/19 R radial arterial line 07/18/19  cEEG 11/20 - 11/22 - diffuse encephalopathy. No seizures.  CXR 11/28 ARDS Echo 11/18 EF 35% LE duplex 11/20 no DVT CT head - no acute abnormality MRI brain 11/21 No acute abnormality   Micro Data:  Resp culture 11/28>> rare GPC, reincubated for better growth Blood Cx - 11/17 - NG4D  Respiratory Cx 11/17 - moraxella catarrhalis Urine Cx 11/17 - no growth MRSA pcr - negative COVID 11/17 - positive  Antimicrobials:  CTX - 11/18 x 5 days Azithro - complete Remdesivir 11/17 >> 11/21 Actemra 11/24 Vancomycin 11/28>>11/30 Cefepime 11/28>>  Interim  history/subjective:   He is critically ill, intubated Remains on 70%/PEEP of 14 Ketamine has been tapered off, remains on Dilaudid and Versed On CRRT, remains anuric Hypotensive this morning on increasing doses of Levophed  Objective   Blood pressure (!) 116/47, pulse (!) 101, temperature (!) 97.5 F (36.4 C), resp. rate (!) 30, height '5\' 6"'$  (1.676 m), weight 83.5 kg, SpO2 100 %.    Vent Mode: PRVC FiO2 (%):  [70 %] 70 % Set Rate:  [30 bmp] 30 bmp Vt Set:  [400 mL] 400 mL PEEP:  [14 cmH20] 14 cmH20 Plateau Pressure:  [32 cmH20-42 cmH20] 32 cmH20   Intake/Output Summary (Last 24 hours) at 07/26/2019 1647 Last data filed at 07/26/2019 1600 Gross per 24 hour  Intake 2032.48 ml  Output 2480 ml  Net -447.52 ml   Filed Weights   07/24/19 0335 07/25/19 0316 07/26/19 0444  Weight: 84.7 kg 83.1 kg 83.5 kg    Examination: Unchanged compared to 12/1 General: Well-developed, critically ill appearing male. NAD HENT: Normocephalic, PERRL-sluggish. Moist mucus membranes Neck: No JVD. Trachea midline.  CV: RRR. S1S2. No MRG. +2 distal pulses Lungs: BBS faint throughout with poor excursion. No rhonchi or wheezing. Symmetrical, vent supported  ABD: +BS x4. SNT/ND. No masses, guarding or rigidity GU: Foley. FMS with brown liquid stool  EXT: Trace edema to feet  Skin: PWD. In tact. No rashes or lesions Neuro: RASS -5 on sedative drips  Chest x-ray 12/1 personally reviewed which shows bilateral interstitial multifocal infiltrates ABG this a.m. shows worsening acute respiratory acidosis  Resolved Hospital Problem list     Assessment & Plan:  # ARDS due to COVID-19,  m catarrhalis PNA -  s/p azithro (11/18-11/21) and CTX (11/17 - 11/21).   Also s/p remdesivir, actemra x 1, steroids x 10 days.  Continued Secretions/O2 needs concerning for VAP.   -Continue low tidal volume ventilation, PRVC mode - maintain plateau less than 35, driving pressure less than 15 -Will not prone further unless  hypoxia worsens. -Remains on 70%/PEEP 14 -Allowing permissive hypercarbia , will add bicarbonate drip to get pH above 7.20 range, keep RR 24-30 range  Shock-septic versus cardiogenic/acidosis related - hypotensive again with increasing pressor requirements likely d/t refractory acidosis and possible sepsis d/t VAP. WBC  increasing.  -We will cap Levophed at 20 mics -Central line and dialysis catheter probably need to be changed but given worsening today, will hold off   # Rising D dimer-full dose Lovenox while on CRRT   # Acute encephalopathy - one episode of seizure like activity on 11/18. No further episodes seen. EEG did not show epileptiform activity, but did show diffuse encephalopathy. Neurology following. MRI brain 11/21 No acute abnormality.    --Maintain deep sedation since vecuronium being used intermittently for vent asynchrony -Ketamine was discontinued, maintain Versed and Dilaudid drips -Oxycodone, Seroquel and clonazepam have been added, can titrate to goal RASS -5   # Acute heart failure- EF 35%, some combination COVID myocarditis, shock cardiomyopathy.   # AKI - related to acute illness, question of cardiorenal syndrome , continue CRRT # Hyperglycemia-SSI, CBG goal less than 180 # Anemia-no Sn/Sx bleeding. Likely of critical illness    Goals of care discussion with family 12/1-DNR issued, but they would like to continue current forms of life support including dialysis and mechanical ventilation to see if he has any recovery over the next few days.  Have involved palliative care for further discussion  Best practice:  Diet: TF Pain/Anxiety/Delirium protocol (if indicated): PAD protocol VAP protocol (if indicated): in place DVT prophylaxis: lovenox GI prophylaxis: pepcid Glucose control: levemir w/ rSSI Mobility: Bedrest Code Status: DNR Family Communication:updated daughter Caryl Pina & son Myrtis Hopping 12/1.  Disposition: ICU  The patient is critically ill with  multiple organ systems failure and requires high complexity decision making for assessment and support, frequent evaluation and titration of therapies, application of advanced monitoring technologies and extensive interpretation of multiple databases. Critical Care Time devoted to patient care services described in this note independent of APP/resident  time is 32 minutes.    Kara Mead MD. Shade Flood. [facility]  If no response to pager , please call [phone]   07/26/2019

## 2019-07-26 NOTE — Progress Notes (Signed)
[facility] Progress Note Patient Name: Craig Mccoy DOB: 06-01-1956 MRN: [id number]   Date of Service  07/26/2019  HPI/Events of Note  ABG on 70%/PRVC 30/TV 400/P 14 = 7.18/78.6/140  eICU Interventions  Will order: 1. NaHCO3 100 meq IV now. 2. Increase NaHCO3 IV infusion to 100 mL/hour.  3. Repeat ABG at 5 AM.     Intervention Category Major Interventions: Acid-Base disturbance - evaluation and management;Respiratory failure - evaluation and management  Sommer,Steven Cornelia Copa 07/26/2019, 10:13 PM

## 2019-07-26 NOTE — Progress Notes (Signed)
Patient ID: Craig Mccoy, male   DOB: 01/21/1956, 63 y.o.   MRN: [id number] S: Goals of care meeting 07/25/19 resulted in pt now DNR but want to continue with CRRT and mechanical ventilation for now. O:BP (!) 101/53   Pulse (!) 105   Temp 98.1 F (36.7 C)   Resp (!) 30   Ht '5\' 6"'$  (1.676 m)   Wt 83.5 kg   SpO2 100%   BMI 29.71 kg/m   Intake/Output Summary (Last 24 hours) at 07/26/2019 1225 Last data filed at 07/26/2019 1200 Gross per 24 hour  Intake 1861.85 ml  Output 2717 ml  Net -855.15 ml   Intake/Output: I/O last 3 completed shifts: In: 2781 [I.V.:879.8; NG/GT:1570; IV Piggyback:331.2] Out: 0160 [Urine:145; FUXNA:3557; Stool:800]  Intake/Output this shift:  Total I/O In: 542.3 [I.V.:142.3; NG/GT:300; IV Piggyback:100] Out: 320 [Other:320] Weight change: 0.4 kg Physical exam: unable to complete due to COVID + status.  In order to preserve PPE equipment and to minimize exposure to providers.  Notes from other caregivers reviewed  Recent Labs  Lab 07/21/19 0337  07/22/19 0420  07/23/19 0340 07/23/19 0341 07/23/19 1631 07/24/19 0427  07/24/19 1600 07/24/19 2341 07/25/19 0438 07/25/19 0859 07/25/19 1600 07/26/19 0447 07/26/19 0500  NA 139   < >  --    < > 137  --  136 138   < > 136 136 137 135 136 135 136  K 5.2*   < >  --    < > 5.0  --  5.2* 4.5   < > 4.0 3.5 3.6 3.6 3.4* 3.6 3.6  CL 105   < >  --    < > 106  --  105 103  --  102 102 102  --  102  --  103  CO2 27   < >  --    < > 24  --  23 27  --  '25 24 24  '$ --  24  --  26  GLUCOSE 163*   < >  --    < > 113*  --  150* 125*  --  150* 175* 126*  --  148*  --  149*  BUN 60*   < >  --    < > 45*  --  42* 38*  --  33* 36* 33*  --  38*  --  36*  CREATININE 1.59*   < >  --    < > 1.43*  --  1.48* 1.50*  --  1.49* 1.52* 1.61*  --  1.62*  --  1.39*  ALBUMIN 1.6*  1.6*   < > 1.9*   < > 1.9* 1.9* 2.0* 2.1*  2.1*  --  2.2*  --  2.3*  2.3*  --  2.2*  --  2.3*  CALCIUM 7.9*   < >  --    < > 7.8*  --  7.7* 8.1*  --  8.1* 8.2*  8.4*  --  8.2*  --  8.3*  PHOS 3.6   < >  --    < > 4.2  --  4.3 4.6  --  4.0  --  3.4  --  3.5  --  3.5  AST 73*  --  118*  --   --  62*  --  40  --   --   --  31  --   --   --   --   ALT 97*  --  180*  --   --  142*  --  98*  --   --   --  74*  --   --   --   --    < > = values in this interval not displayed.   Liver Function Tests: Recent Labs  Lab 07/23/19 0341  07/24/19 0427  07/25/19 0438 07/25/19 1600 07/26/19 0500  AST 62*  --  40  --  31  --   --   ALT 142*  --  98*  --  74*  --   --   ALKPHOS 181*  --  196*  --  203*  --   --   BILITOT 0.5  --  0.5  --  0.4  --   --   PROT 5.6*  --  5.8*  --  6.0*  --   --   ALBUMIN 1.9*   < > 2.1*  2.1*   < > 2.3*  2.3* 2.2* 2.3*   < > = values in this interval not displayed.   No results for input(s): LIPASE, AMYLASE in the last 168 hours. No results for input(s): AMMONIA in the last 168 hours. CBC: Recent Labs  Lab 07/22/19 0420  07/23/19 0341 07/24/19 0427  07/25/19 0438 07/25/19 0859 07/26/19 0447 07/26/19 0500  WBC 5.2  --  7.7 10.1  --  13.7*  --   --  14.1*  HGB 10.5*   < > 10.4* 9.9*   < > 9.6* 10.5* 10.2* 9.5*  HCT 34.4*   < > 34.7* 34.0*   < > 32.2* 31.0* 30.0* 32.1*  MCV 76.8*  --  78.3* 81.0  --  79.5*  --   --  80.3  PLT 196  --  197 175  --  173  --   --  184   < > = values in this interval not displayed.   Cardiac Enzymes: No results for input(s): CKTOTAL, CKMB, CKMBINDEX, TROPONINI in the last 168 hours. CBG: Recent Labs  Lab 07/25/19 1526 07/25/19 2030 07/26/19 0000 07/26/19 0437 07/26/19 0810  GLUCAP 140* 123* 154* 139* 135*    Iron Studies: No results for input(s): IRON, TIBC, TRANSFERRIN, FERRITIN in the last 72 hours. Studies/Results: Dg Chest Port 1 View  Result Date: 07/25/2019 CLINICAL DATA:  COVID-19. Readjustment of ET tube. EXAM: PORTABLE CHEST 1 VIEW COMPARISON:  07/22/2019 FINDINGS: There is a right IJ catheter with tip in the projection of the SVC. The ET tube tip is above the carina  by 4 cm. There is a feeding tube with tip below the level of the GE junction. Normal heart size. Asymmetric elevation of left hemidiaphragm as before. Small bilateral pleural effusions, unchanged. Bilateral interstitial and airspace opacities persist. Not significantly changed in the interval. IMPRESSION: 1. No change in bilateral interstitial and airspace opacities compatible with multifocal pneumonia. 2. Stable support apparatus. Electronically Signed   By: Kerby Moors M.D.   On: 07/25/2019 09:37   . acetylcysteine  4 mL Nebulization Once  . ascorbic acid  250 mg Per Tube Daily  . aspirin  81 mg Per Tube Daily  . chlorhexidine gluconate (MEDLINE KIT)  15 mL Mouth Rinse BID  . Chlorhexidine Gluconate Cloth  6 each Topical Daily  . clonazepam  1 mg Per Tube BID  . enoxaparin (LOVENOX) injection  0.8 mg/kg Subcutaneous Q24H  . famotidine  20 mg Per Tube Q24H  . feeding supplement (PRO-STAT SUGAR FREE 64)  60 mL Per Tube TID  .  HYDROmorphone (DILAUDID)  injection  1 mg Intravenous Once  . insulin aspart  0-20 Units Subcutaneous Q4H  . insulin detemir  30 Units Subcutaneous BID  . mouth rinse  15 mL Mouth Rinse 10 times per day  . oxyCODONE  10 mg Per Tube Q8H  . pravastatin  10 mg Per Tube Daily  . QUEtiapine  100 mg Per Tube BID  . sodium chloride flush  10-40 mL Intracatheter Q12H  . zinc sulfate  220 mg Per Tube Daily    BMET    Component Value Date/Time   NA 136 07/26/2019 0500   K 3.6 07/26/2019 0500   CL 103 07/26/2019 0500   CO2 26 07/26/2019 0500   GLUCOSE 149 (H) 07/26/2019 0500   BUN 36 (H) 07/26/2019 0500   CREATININE 1.39 (H) 07/26/2019 0500   CALCIUM 8.3 (L) 07/26/2019 0500   GFRNONAA 53 (L) 07/26/2019 0500   GFRAA >60 07/26/2019 0500   CBC    Component Value Date/Time   WBC 14.1 (H) 07/26/2019 0500   RBC 4.00 (L) 07/26/2019 0500   HGB 9.5 (L) 07/26/2019 0500   HCT 32.1 (L) 07/26/2019 0500   PLT 184 07/26/2019 0500   MCV 80.3 07/26/2019 0500   MCH 23.8 (L)  07/26/2019 0500   MCHC 29.6 (L) 07/26/2019 0500   RDW 19.5 (H) 07/26/2019 0500   LYMPHSABS 0.2 (L) 07/18/2019 0455   MONOABS 0.3 07/18/2019 0455   EOSABS 0.0 07/18/2019 0455   BASOSABS 0.0 07/18/2019 0455    Assessment/Plan:  1. AKI- unclear baseline presented with covid PNA and sepsis with BUN/Cr of 219/14.63. Started CRRT 11/17-11/18 with initial improvement of renal function but worsened and restarted CRRT 07/19/19. 1. Will change dialysate solution to 4K/2.5Ca given hypokalemia 2. Continue with CRRT for now and await family decision regarding goals of care. 2. Covid-19 PNA with sepsis and ARDS- s/p remdesivir, decadron per PCCM 3. VDRF with ARDS- attempt to lower FiO2 per PCCM 4. HCAP- treated for moraxella, off vanc and now on zosyn 5. 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 On levophed 6. Anemia of critical illness- transfuse prn 7. Vascular access- untunneled HD catheter in place for 14 days. Will need new access if we are going to continue with aggressive measures.  1. Will ask PCCM to place new access since family wants to continue with CRRT for now. 8. Disposition- poor overall prognosis. Currently DNR and will need ongoing discussions with family as he has not significantly improved despite maximal efforts. Donetta Potts, MD [employer] [phone]

## 2019-07-26 NOTE — Progress Notes (Signed)
Pharmacy Antibiotic & Anticoagulation Note  Craig Mccoy is a 63 y.o. male admitted on 07/22/2019 with COVID and now with increased O2 demands and secretions concerning for VAP.  Pharmacy has been consulted for Cefepime dosing.  The patient is in AKI on CRRT and appears to be tolerating with minimal off time.   Plan: - Cefepime 2g IV every 12 hours - Will continue to follow CRRT tolerance/duration, culture results, LOT, and antibiotic de-escalation plans   Height: 5\' 6"  (167.6 cm) Weight: 184 lb 1.4 oz (83.5 kg) IBW/kg (Calculated) : 63.8  Temp (24hrs), Avg:97.6 F (36.4 C), Min:93.6 F (34.2 C), Max:99.7 F (37.6 C)  Recent Labs  Lab 07/22/19 0420  07/23/19 0341  07/24/19 0427 07/24/19 1600 07/24/19 2341 07/25/19 0438 07/25/19 1600 07/26/19 0500  WBC 5.2  --  7.7  --  10.1  --   --  13.7*  --  14.1*  CREATININE  --    < >  --    < > 1.50* 1.49* 1.52* 1.61* 1.62* 1.39*   < > = values in this interval not displayed.    Estimated Creatinine Clearance: 54.4 mL/min (A) (by C-G formula based on SCr of 1.39 mg/dL (H)).    No Known Allergies  Antimicrobials this admission: 11/17 Remdesivir >>11/21 11/18 Azith>> 11/21 11/17 Ceftriaxone>> 11/21 11/18 Dexamethasone>>11/28 11/28 Vanc >> 11/30 11/28 Cefepime >>  Microbiology results: 11/17 MRSA PCR: neg 11/17 BCx x2: Neg 11/17 Ucx: Negative 11/17 COVID + 11/18 Trach asp: GPC in pairs stain - moraxella catarrhalis beta lactamase positive  11/19 BCx >> neg 11/28 RCx >> nf  Elicia Lamp, PharmD, BCPS Please check AMION for all [facility] contact numbers Clinical Pharmacist 07/26/2019 9:31 AM

## 2019-07-27 DIAGNOSIS — J9600 Acute respiratory failure, unspecified whether with hypoxia or hypercapnia: Secondary | ICD-10-CM | POA: Diagnosis not present

## 2019-07-27 DIAGNOSIS — R579 Shock, unspecified: Secondary | ICD-10-CM

## 2019-07-27 LAB — BLOOD GAS, ARTERIAL
Acid-Base Excess: 3.8 mmol/L — ABNORMAL HIGH (ref 0.0–2.0)
Bicarbonate: 31.3 mmol/L — ABNORMAL HIGH (ref 20.0–28.0)
FIO2: 70
O2 Saturation: 98.8 %
Patient temperature: 36.9
pCO2 arterial: 82.7 mmHg (ref 32.0–48.0)
pH, Arterial: 7.203 — ABNORMAL LOW (ref 7.350–7.450)
pO2, Arterial: 169 mmHg — ABNORMAL HIGH (ref 83.0–108.0)

## 2019-07-27 LAB — GLUCOSE, CAPILLARY
Glucose-Capillary: 101 mg/dL — ABNORMAL HIGH (ref 70–99)
Glucose-Capillary: 131 mg/dL — ABNORMAL HIGH (ref 70–99)
Glucose-Capillary: 134 mg/dL — ABNORMAL HIGH (ref 70–99)
Glucose-Capillary: 144 mg/dL — ABNORMAL HIGH (ref 70–99)
Glucose-Capillary: 154 mg/dL — ABNORMAL HIGH (ref 70–99)
Glucose-Capillary: 82 mg/dL (ref 70–99)

## 2019-07-27 LAB — RENAL FUNCTION PANEL
Albumin: 2.5 g/dL — ABNORMAL LOW (ref 3.5–5.0)
Albumin: 2.6 g/dL — ABNORMAL LOW (ref 3.5–5.0)
Anion gap: 7 (ref 5–15)
Anion gap: 7 (ref 5–15)
BUN: 33 mg/dL — ABNORMAL HIGH (ref 8–23)
BUN: 32 mg/dL — ABNORMAL HIGH (ref 8–23)
CO2: 31 mmol/L (ref 22–32)
CO2: 31 mmol/L (ref 22–32)
Calcium: 8.4 mg/dL — ABNORMAL LOW (ref 8.9–10.3)
Calcium: 8.3 mg/dL — ABNORMAL LOW (ref 8.9–10.3)
Chloride: 101 mmol/L (ref 98–111)
Chloride: 100 mmol/L (ref 98–111)
Creatinine, Ser: 1.56 mg/dL — ABNORMAL HIGH (ref 0.61–1.24)
Creatinine, Ser: 1.53 mg/dL — ABNORMAL HIGH (ref 0.61–1.24)
GFR calc Af Amer: 54 mL/min — ABNORMAL LOW (ref >60)
GFR calc Af Amer: 55 mL/min — ABNORMAL LOW (ref >60)
GFR calc non Af Amer: 46 mL/min — ABNORMAL LOW (ref >60)
GFR calc non Af Amer: 47 mL/min — ABNORMAL LOW (ref >60)
Glucose, Bld: 84 mg/dL (ref 70–99)
Glucose, Bld: 149 mg/dL — ABNORMAL HIGH (ref 70–99)
Phosphorus: 3 mg/dL (ref 2.5–4.6)
Phosphorus: 3.2 mg/dL (ref 2.5–4.6)
Potassium: 3.8 mmol/L (ref 3.5–5.1)
Potassium: 4 mmol/L (ref 3.5–5.1)
Sodium: 139 mmol/L (ref 135–145)
Sodium: 138 mmol/L (ref 135–145)

## 2019-07-27 LAB — CBC
HCT: 33.2 % — ABNORMAL LOW (ref 39.0–52.0)
Hemoglobin: 9.9 g/dL — ABNORMAL LOW (ref 13.0–17.0)
MCH: 23.7 pg — ABNORMAL LOW (ref 26.0–34.0)
MCHC: 29.8 g/dL — ABNORMAL LOW (ref 30.0–36.0)
MCV: 79.6 fL — ABNORMAL LOW (ref 80.0–100.0)
Platelets: 215 10*3/uL (ref 150–400)
RBC: 4.17 MIL/uL — ABNORMAL LOW (ref 4.22–5.81)
RDW: 19.9 % — ABNORMAL HIGH (ref 11.5–15.5)
WBC: 10.9 10*3/uL — ABNORMAL HIGH (ref 4.0–10.5)
nRBC: 6.5 % — ABNORMAL HIGH (ref 0.0–0.2)

## 2019-07-27 LAB — MAGNESIUM: Magnesium: 2.7 mg/dL — ABNORMAL HIGH (ref 1.7–2.4)

## 2019-07-27 NOTE — Progress Notes (Signed)
Daily Progress Note   Patient Name: Craig Mccoy       Date: 07/27/2019 DOB: 1956-07-06  Age: 63 y.o. MRN#: [id number] Attending Physician: Candee Furbish, MD Primary Care Physician: Celene Squibb, MD Admit Date: 06/27/2019  Reason for Consultation/Follow-up: Establishing goals of care  Subjective: Noted patient worsening status- at max levophed and remains hypotensive. No change in mental status. No weaning. Noted attending discussion with family re: patient dying, irreversible process.  I attempted to call patient's son- no answer, no VM. Called daughter Ashely- left message requesting return call.      Length of Stay: 16  Vital Signs: BP (!) 106/47   Pulse 90   Temp (!) 97.3 F (36.3 C)   Resp (!) 30   Ht 5\' 6"  (1.676 m)   Wt 84 kg   SpO2 100%   BMI 29.89 kg/m  SpO2: SpO2: 100 % O2 Device: O2 Device: Ventilator O2 Flow Rate: O2 Flow Rate (L/min): 15 L/min  Intake/output summary:   Intake/Output Summary (Last 24 hours) at 07/27/2019 1628 Last data filed at 07/27/2019 1500 Gross per 24 hour  Intake 3797.74 ml  Output 5338 ml  Net -1540.26 ml   LBM: Last BM Date: 07/25/19 Baseline Weight: Weight: 81.6 kg Most recent weight: Weight: 84 kg       Palliative Assessment/Data: PPS: 10%      Patient Active Problem List   Diagnosis Date Noted  . ARDS (adult respiratory distress syndrome) ([place])   . COVID-19 virus infection   . Palliative care by specialist   . Goals of care, counseling/discussion   . Acute respiratory failure ([place])   . Acute renal failure ([place])   . Acute respiratory failure with hypoxia ([place])   . Pneumonia due to COVID-19 virus 07/03/2019  . Rectal bleeding 06/14/2014    Palliative Care Assessment & Plan   Patient Profile: 63 y.o. male  with  past medical history of gout, HTN, MI, admitted on 06/30/2019 with respiratory distress. Tested COVID+ prior to admission- was confirmed on admission. Recovery has been complicated by findings of heart failure- 35% on 11/18 ECHO, encephalopathy (EEG negative for seizure- showed profound diffuse encephalopathy- nonspecific to etiology), ARDS with onging vent dependence- not able to wean, asynchrony requiring repeat paralytics, renal and hepatic failure- now on CRRT. Also requiring IV  pressors to maintain BP. Dr. Elsworth Soho had [place] discussion with family on 12/1 which resulted in agreement to DNR, Palliative consulted for further [place].   Assessment/Recommendations/Plan   Awaiting return call from [place] providers limit offers of medical interventions that are considered futile- ie- patient actively dying- continued CRRT and offering IHD will not change this course, do not offer trach/PEG, etc.  PMT will continue to try and reach patient's children for discussion- I do worry patient may die before agreement on comfort care can be reached  Code Status:  DNR  Prognosis:   Hours - Days  Discharge Planning:  Anticipated Hospital Death   Thank you for allowing the Palliative Medicine Team to assist in the care of this patient.   Time In: 1615 Time Out: 1635 Total Time 25 mins Prolonged Time Billed no      Greater than 50%  of this time was spent counseling and coordinating care related to the above assessment and plan.  The above evaluation and plan was completed virtually due to the the COVID-19 pandemic. Thorough chart review and discussion with necessary members of the care team was completed as part of assessment. All issues were discussed and addressed but no physical exam was performed.  Mariana Kaufman, AGNP-C Palliative Medicine   Please contact Palliative Medicine Team phone at [phone] for questions and concerns.

## 2019-07-27 NOTE — Progress Notes (Signed)
Nutrition Follow-up  DOCUMENTATION CODES:   Not applicable  INTERVENTION:   For now, continue TF via Cortrak tube:   Nepro at 40 ml/h.  Pro-stat 60 ml TID.  Provides 2328 kcal, 168 gm protein, 698 ml free water daily.  If plans change to comfort care, recommend d/c tube feeding.  NUTRITION DIAGNOSIS:   Increased nutrient needs related to acute illness(COVID PNA) as evidenced by estimated needs.  Ongoing  GOAL:   Patient will meet greater than or equal to 90% of their needs  Met with TF  MONITOR:   Vent status, Labs, I & O's  ASSESSMENT:   63 yo male admitted with COVID PNA, AKI. PMH includes HTN, MI.    Cortrak tube in place, tip in the stomach. Receiving Nepro at 40 ml/h with Pro-stat 60 ml TID, tolerating well. Providing 2328 kcal, 168 gm protein, 698 ml free water. Remains on CRRT. Noted very poor prognosis; MD is discussing comfort care with family.   Patient remains intubated on ventilator support, on hydromorphone and levophed drips. MV: 11.8 L/min Temp (24hrs), Avg:98 F (36.7 C), Min:96.1 F (35.6 C), Max:99 F (37.2 C)   Labs reviewed. BUN 33 (H), creatinine 1.56 (H), phosphorus 3 WDL, potassium 3.8 WDL, magnesium 2.7 (H) CBG's: [phone]  Medications reviewed and include vitamin C, novolog, levemir, zinc sulfate, levophed. IVF: Sodium bicarb at 100 ml/h.   Current weight is above admission weight by 2.4kg I/O +5.8 L since admission  Diet Order:   Diet Order            Diet NPO time specified  Diet effective now              EDUCATION NEEDS:   Not appropriate for education at this time  Skin:  Skin Assessment: Reviewed RN Assessment  Last BM:  12/3 rectal tube  Height:   Ht Readings from Last 1 Encounters:  07/19/19 '5\' 6"'$  (1.676 m)    Weight:   Wt Readings from Last 1 Encounters:  07/27/19 84 kg   Admission weight 80 kg (11/17) BMI=28.5  Ideal Body Weight:  64.5 kg  BMI:  Body mass index is 29.89  kg/m.  Estimated Nutritional Needs:   Kcal:  2400  Protein:  130-160 gm  Fluid:  >/= 2 L    Molli Barrows, RD, LDN, [place] Pager [phone] After Hours Pager [phone]

## 2019-07-27 NOTE — Progress Notes (Signed)
Patient ID: Vishnu Moeller, male   DOB: 1956-08-16, 63 y.o.   MRN: [id number] S: had worsening acidosis overnight and started on IV bicarb O:BP 99/61   Pulse 100   Temp 97.9 F (36.6 C)   Resp (!) 30   Ht '5\' 6"'$  (1.676 m)   Wt 84 kg   SpO2 100%   BMI 29.89 kg/m   Intake/Output Summary (Last 24 hours) at 07/27/2019 1255 Last data filed at 07/27/2019 1100 Gross per 24 hour  Intake 3522.77 ml  Output 4794 ml  Net -1271.23 ml   Intake/Output: I/O last 3 completed shifts: In: 4119.6 [I.V.:2183; NG/GT:1640; IV Piggyback:296.6] Out: 5701 [Urine:80; ZGYFV:4944; Stool:550]  Intake/Output this shift:  Total I/O In: 844.4 [I.V.:514.8; NG/GT:160; IV Piggyback:169.6] Out: 900 [Other:900] Weight change: 0.5 kg Physical exam: unable to complete due to COVID + status.  In order to preserve PPE equipment and to minimize exposure to providers.  Notes from other caregivers reviewed   Recent Labs  Lab 07/21/19 0337  07/22/19 0420  07/23/19 0341  07/24/19 0427  07/24/19 1600 07/24/19 2341 07/25/19 0438 07/25/19 0859 07/25/19 1600 07/26/19 0447 07/26/19 0500 07/26/19 1518 07/27/19 0429  NA 139   < >  --    < >  --    < > 138   < > 136 136 137 135 136 135 136 136 139  K 5.2*   < >  --    < >  --    < > 4.5   < > 4.0 3.5 3.6 3.6 3.4* 3.6 3.6 3.9 3.8  CL 105   < >  --    < >  --    < > 103  --  102 102 102  --  102  --  103 104 101  CO2 27   < >  --    < >  --    < > 27  --  '25 24 24  '$ --  24  --  '26 26 31  '$ GLUCOSE 163*   < >  --    < >  --    < > 125*  --  150* 175* 126*  --  148*  --  149* 106* 84  BUN 60*   < >  --    < >  --    < > 38*  --  33* 36* 33*  --  38*  --  36* 31* 33*  CREATININE 1.59*   < >  --    < >  --    < > 1.50*  --  1.49* 1.52* 1.61*  --  1.62*  --  1.39* 1.46* 1.56*  ALBUMIN 1.6*  1.6*   < > 1.9*   < > 1.9*   < > 2.1*  2.1*  --  2.2*  --  2.3*  2.3*  --  2.2*  --  2.3* 2.3* 2.5*  CALCIUM 7.9*   < >  --    < >  --    < > 8.1*  --  8.1* 8.2* 8.4*  --  8.2*  --  8.3*  8.3* 8.4*  PHOS 3.6   < >  --    < >  --    < > 4.6  --  4.0  --  3.4  --  3.5  --  3.5 3.2 3.0  AST 73*  --  118*  --  62*  --  40  --   --   --  31  --   --   --   --   --   --   ALT 97*  --  180*  --  142*  --  98*  --   --   --  74*  --   --   --   --   --   --    < > = values in this interval not displayed.   Liver Function Tests: Recent Labs  Lab 07/23/19 0341  07/24/19 0427  07/25/19 0438  07/26/19 0500 07/26/19 1518 07/27/19 0429  AST 62*  --  40  --  31  --   --   --   --   ALT 142*  --  98*  --  74*  --   --   --   --   ALKPHOS 181*  --  196*  --  203*  --   --   --   --   BILITOT 0.5  --  0.5  --  0.4  --   --   --   --   PROT 5.6*  --  5.8*  --  6.0*  --   --   --   --   ALBUMIN 1.9*   < > 2.1*  2.1*   < > 2.3*  2.3*   < > 2.3* 2.3* 2.5*   < > = values in this interval not displayed.   No results for input(s): LIPASE, AMYLASE in the last 168 hours. No results for input(s): AMMONIA in the last 168 hours. CBC: Recent Labs  Lab 07/23/19 0341 07/24/19 0427  07/25/19 0438  07/26/19 0447 07/26/19 0500 07/27/19 0429  WBC 7.7 10.1  --  13.7*  --   --  14.1* 10.9*  HGB 10.4* 9.9*   < > 9.6*   < > 10.2* 9.5* 9.9*  HCT 34.7* 34.0*   < > 32.2*   < > 30.0* 32.1* 33.2*  MCV 78.3* 81.0  --  79.5*  --   --  80.3 79.6*  PLT 197 175  --  173  --   --  184 215   < > = values in this interval not displayed.   Cardiac Enzymes: No results for input(s): CKTOTAL, CKMB, CKMBINDEX, TROPONINI in the last 168 hours. CBG: Recent Labs  Lab 07/26/19 2042 07/27/19 0052 07/27/19 0421 07/27/19 0746 07/27/19 1118  GLUCAP 141* 154* 82 101* 131*    Iron Studies: No results for input(s): IRON, TIBC, TRANSFERRIN, FERRITIN in the last 72 hours. Studies/Results: No results found. [place] [facility] ascorbic acid  250 mg Per Tube Daily  . aspirin  81 mg Per Tube Daily  . chlorhexidine gluconate (MEDLINE KIT)  15 mL Mouth Rinse BID  . Chlorhexidine Gluconate Cloth  6 each Topical Daily  . clonazepam   1 mg Per Tube BID  . enoxaparin (LOVENOX) injection  0.8 mg/kg Subcutaneous Q24H  . famotidine  20 mg Per Tube Q24H  . feeding supplement (PRO-STAT SUGAR FREE 64)  60 mL Per Tube TID  .  HYDROmorphone (DILAUDID) injection  1 mg Intravenous Once  . insulin aspart  0-20 Units Subcutaneous Q4H  . insulin detemir  30 Units Subcutaneous BID  . mouth rinse  15 mL Mouth Rinse 10 times per day  . oxyCODONE  10 mg Per Tube Q8H  . pravastatin  10 mg Per Tube Daily  . QUEtiapine  100 mg Per Tube BID  . sodium chloride flush  10-40  mL Intracatheter Q12H  . zinc sulfate  220 mg Per Tube Daily    BMET    Component Value Date/Time   NA 139 07/27/2019 0429   K 3.8 07/27/2019 0429   CL 101 07/27/2019 0429   CO2 31 07/27/2019 0429   GLUCOSE 84 07/27/2019 0429   BUN 33 (H) 07/27/2019 0429   CREATININE 1.56 (H) 07/27/2019 0429   CALCIUM 8.4 (L) 07/27/2019 0429   GFRNONAA 46 (L) 07/27/2019 0429   GFRAA 54 (L) 07/27/2019 0429   CBC    Component Value Date/Time   WBC 10.9 (H) 07/27/2019 0429   RBC 4.17 (L) 07/27/2019 0429   HGB 9.9 (L) 07/27/2019 0429   HCT 33.2 (L) 07/27/2019 0429   PLT 215 07/27/2019 0429   MCV 79.6 (L) 07/27/2019 0429   MCH 23.7 (L) 07/27/2019 0429   MCHC 29.8 (L) 07/27/2019 0429   RDW 19.9 (H) 07/27/2019 0429   LYMPHSABS 0.2 (L) 07/18/2019 0455   MONOABS 0.3 07/18/2019 0455   EOSABS 0.0 07/18/2019 0455   BASOSABS 0.0 07/18/2019 0455    Assessment/Plan:  1. AKI- unclear baseline presented with covid PNA and sepsis with BUN/Cr of 219/14.63. Started CRRT 11/17-11/18 with initial improvement of renal function but worsened and restarted CRRT 07/19/19. 1. Will change dialysate solution to 4K/2.5Ca given hypokalemia 2. Continue with CRRT for now and await family decision regarding goals of care. 2. Covid-19 PNA with sepsis and ARDS- s/p remdesivir, decadron per PCCM 3. VDRF with ARDS- attempt to lower FiO2 per PCCM.  Worsening acidemia and started on bicarb drip per PCCM.    4. HCAP- treated for moraxella, off vanc and now on zosyn 5. 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 On levophed 6. Anemia of critical illness- transfuse prn 7. Vascular access- untunneled HD catheter in place for 14days. Will need new access if we are going to continue with aggressive measures.  1. I have asked PCCMto place new access since family wants to continue with CRRT for now, however this was delayed given his clinical deterioration. 8. Disposition- poor overall prognosis. Currently DNR and will need ongoing discussions with family as he has not significantly improved despite maximal efforts.  Donetta Potts, MD [employer] [phone]

## 2019-07-27 NOTE — Progress Notes (Signed)
NAME:  Craig Mccoy, MRN:  [id number], DOB:  22-Dec-1955, LOS: 16 ADMISSION DATE:  07/08/2019, CONSULTATION DATE:  11/17 REFERRING MD:  Rogene Houston, CHIEF COMPLAINT:  Acute respiratory failure    History of present illness   Craig Mccoy a 63 y.o.malepresented to [name] 11/17 with dyspnea and respiratory distress. He had apparently tested positive for COVID 19 on 11/12 at an outside institution and had been in self isolation. Had multiple metabolic derangements including marked renal failure (SCr 14.63 with BUN 219), met acidosis + resp alkalosis (7.28, <19, 72). Developed ARDS & required CRRT    Past Medical History  MI, HTN  Significant Hospital Events   11/17 - transferred to [facility] from [facility]  11/19 - possible seizure like activity witnessed by RN 11/25 CRRT, proning 11/26 no proning 11/27 plugged off left mainstem requiring urgent bronch, switch off paralytics, switch fentanyl to dilaudid + ketamine 11/28 proning, re-paralyzed, inability to ventilate despite all aggressive measures, 2 physician DNR 11/29 survived night, mildly improved ventilation 11/30 increasing pressor requirements, 80% FiO2, High PP  12/2 - He is critically ill, intubated. Remains on 70%/PEEP of 14 Ketamine has been tapered off, remains on Dilaudid and Versed On CRRT, remains anuric Hypotensive this morning on increasing doses of Levophed  Consults:  Nephrology Neurology  Procedures/Tests/Lines:  ETT 11/17 >> R femoral CVC 07/10/2019 >> R IJ HD catheter 07/01/2019 >> Rectal tube 07/20/19 Cortrak 07/19/19 R radial arterial line 07/18/19  cEEG 11/20 - 11/22 - diffuse encephalopathy. No seizures.  CXR 11/28 ARDS Echo 11/18 EF 35% LE duplex 11/20 no DVT CT head - no acute abnormality MRI brain 11/21 No acute abnormality   Micro Data:  Resp culture 11/28>> rare GPC, reincubated for better growth Blood Cx - 11/17 - NG4D  Respiratory Cx 11/17 - moraxella catarrhalis Urine Cx 11/17 - no growth  MRSA pcr - negative COVID 11/17 - positive  Antimicrobials:  CTX - 11/18 x 5 days Azithro - complete Remdesivir 11/17 >> 11/21 Actemra 11/24 x 1 Vancomycin 11/28>>11/30 Cefepime 11/28>>  Interim history/subjective:   07/27/2019  - pall care consult 12/2 - family struggling between reality and hope and guilt about terminal wean decisions. Severely acidotic overnight. On vent 70% fio2, dilaudid gtt, versed gtt, dilaudid gtt, leviophed gtt, bic gtt, CRRT. Bbic increased overnight due to referactory metabolic acidosis. Smell of body rot in the room   Per RN - son ready for making patient comfort. Daughter not ready . Mom also died of covid  Objective   Blood pressure (!) 97/58, pulse 98, temperature 98.6 F (37 C), resp. rate (!) 30, height '5\' 6"'$  (1.676 m), weight 84 kg, SpO2 100 %.    Vent Mode: PRVC FiO2 (%):  [70 %] 70 % Set Rate:  [30 bmp] 30 bmp Vt Set:  [400 mL] 400 mL PEEP:  [14 cmH20] 14 cmH20 Plateau Pressure:  [32 cmH20-44 cmH20] 43 cmH20   Intake/Output Summary (Last 24 hours) at 07/27/2019 0802 Last data filed at 07/27/2019 0800 Gross per 24 hour  Intake 3320.41 ml  Output 4366 ml  Net -1045.59 ml   Filed Weights   07/25/19 0316 07/26/19 0444 07/27/19 0429  Weight: 83.1 kg 83.5 kg 84 kg    Exam made via inspection due to covid-19 pandemic and d/w RN  General Appearance:  Looks criticall ill Head:  Normocephalic, without obvious abnormality, atraumatic Eyes:  PERRL - not examined, conjunctiva/corneas - not examined     Ears:  Normal external ear canals, both  ears Nose:  G tube - no Throat:  ETT TUBE - yes , OG tube - yes Neck:  Looks normal Lungs:  Ventilator   Synchrony - yes Heart:  , CVP - x.  Pressors - levoped Abdomen:  Soft, no masses, no organomegaly Genitalia / Rectal:  Not done Extremities:  Extremities- intact Skin:  ntact in exposed areas . Sacral area - not bedsdore per RN Neurologic:  Sedation - dilaudid gtt and versed gtt -> RASS - -4/-5       Resolved Hospital Problem list     Assessment & Plan:  # ARDS due to COVID-19, m catarrhalis PNA -  s/p azithro (11/18-11/21) and CTX (11/17 - 11/21).   Also s/p remdesivir, actemra x 1, steroids x 10 days.     07/27/2019 - > does not meet criteria for SBT/Extubation in setting of Acute Respiratory Failure due to ARDS severe  Plan - ARDS protocol, PRVC  -Continue low tidal volume ventilation, PRVC mode  # Acute heart failure- EF 35%, some combination COVID myocarditis, shock cardiomyopathy. Shock-septic versus cardiogenic/acidosis related   - 07/27/2019 - refractory shock. On levophed 69mg  Plan  - levophed for MAP > 65 - no further pressors if deterooprates  # Rising D dimer-full dose Lovenox while on CRRT   # Acute encephalopathy - one episode of seizure like activity on 11/18. No further episodes seen. EEG did not show epileptiform activity, but did show diffuse encephalopathy. Neurology following. MRI brain 11/21 No acute abnormality.    07/27/2019 - RASS -5 and no seizures  Plan  --Maintain deep sedation since vecuronium being used intermittently for vent asynchrony -Ketamine was discontinued, maintain Versed and Dilaudid drips -Oxycodone, Seroquel and clonazepam , can titrate to goal RASS -5    # AKI - related to acute illness, question of cardiorenal syndrome ,   07/27/2019 - still anuric  Plan - continue CRRT   # Hyperglycemia-SSI, CBG goal less than 180   # Anemia-no Sn/Sx bleeding. Likely of critical illness  07/27/2019 - no active bleeding.   Plan  - - PRBC for hgb </= 6.9gm%    - exceptions are   -  if ACS susepcted/confirmed then transfuse for hgb </= 8.0gm%,  or    -  active bleeding with hemodynamic instability, then transfuse regardless of hemoglobin value   At at all times try to transfuse 1 unit prbc as possible with exception of active hemorrhage   #VAP  Anti-infectives (From admission, onward)   Start     Dose/Rate Route Frequency  Ordered Stop   07/23/19 1400  vancomycin (VANCOCIN) IVPB 1000 mg/200 mL premix  Status:  Discontinued     1,000 mg 200 mL/hr over 60 Minutes Intravenous Every 24 hours 07/22/19 1045 07/24/19 1225   07/22/19 2200  ceFEPIme (MAXIPIME) 2 g in sodium chloride 0.9 % 100 mL IVPB     2 g 200 mL/hr over 30 Minutes Intravenous Every 12 hours 07/22/19 1045     07/22/19 1000  vancomycin (VANCOCIN) 1,750 mg in sodium chloride 0.9 % 500 mL IVPB     1,750 mg 250 mL/hr over 120 Minutes Intravenous  Once 07/22/19 0946 07/22/19 1322   07/22/19 1000  ceFEPIme (MAXIPIME) 2 g in sodium chloride 0.9 % 100 mL IVPB     2 g 200 mL/hr over 30 Minutes Intravenous  Once 07/22/19 0946 07/22/19 1416   07/12/19 2200  cefTRIAXone (ROCEPHIN) 1 g in sodium chloride 0.9 % 100 mL IVPB  Status:  Discontinued     1 g 200 mL/hr over 30 Minutes Intravenous Every 24 hours 07/22/2019 2303 07/13/2019 2356   07/12/19 2200  remdesivir 100 mg in sodium chloride 0.9 % 250 mL IVPB     100 mg 500 mL/hr over 30 Minutes Intravenous Every 24 hours 06/25/2019 2312 07/15/19 2316   07/12/19 0015  cefTRIAXone (ROCEPHIN) 2 g in sodium chloride 0.9 % 100 mL IVPB     2 g 200 mL/hr over 30 Minutes Intravenous Daily at bedtime 07/22/2019 2356 07/15/19 2231   07/12/19 0000  azithromycin (ZITHROMAX) 500 mg in sodium chloride 0.9 % 250 mL IVPB     500 mg 250 mL/hr over 60 Minutes Intravenous Every 24 hours 07/24/2019 2303 07/15/19 0207   07/12/19 0000  remdesivir 200 mg in sodium chloride 0.9 % 250 mL IVPB     200 mg 500 mL/hr over 30 Minutes Intravenous Once 07/17/2019 2312 07/12/19 0144   07/10/2019 2315  cefTRIAXone (ROCEPHIN) 1 g in sodium chloride 0.9 % 100 mL IVPB  Status:  Discontinued     1 g 200 mL/hr over 30 Minutes Intravenous  Once 07/05/2019 2306 07/04/2019 2355       Best practice:  Diet: TF Pain/Anxiety/Delirium protocol (if indicated): PAD protocol VAP protocol (if indicated): in place DVT prophylaxis: lovenox GI prophylaxis: pepcid  Glucose control: levemir w/ rSSI Mobility: Bedrest Code Status: DNR    -  Goals of care discussion with family 12/1-DNR issued, but they would like to continue current forms of life support including dialysis and mechanical ventilation to see if he has any recovery over the next few days.  Have involved palliative care for further discussion  Family Communication:updated daughter Caryl Pina & son Myrtis Hopping 12/1.  - On 07/27/2019 - Dr Chanetta Marshall d/w daugghter - explained patient is actively dying and process is irreversible. Explained that  Current active care is prolonging the irreversible dying process. Daughter is fully aware of the reality. Says the son/her brother is in favor of comfort care and terminal wean but she is struggling to come to terms with reality. She is going to reflect on our conversation, talk to her brother and pray for solace. We will reconvene with her  Disposition: ICU     ATTESTATION & SIGNATURE   The patient Craig Mccoy is critically ill with multiple organ systems failure and requires high complexity decision making for assessment and support, frequent evaluation and titration of therapies, application of advanced monitoring technologies and extensive interpretation of multiple databases.   Critical Care Time devoted to patient care services described in this note is  30  Minutes. This time reflects time of care of this signee Dr Brand Males. This critical care time does not reflect procedure time, or teaching time or supervisory time of PA/NP/Med student/Med Resident etc but could involve care discussion time     Dr. Brand Males, M.D., [facility].C.P Pulmonary and Critical Care Medicine Staff Physician [facility] Pager: [phone], If no answer or between  15:00h - 7:00h: call [phone]  07/27/2019 8:08 AM     LABS    PULMONARY Recent Labs  Lab 07/22/19 0351 07/23/19 0330 07/24/19 1359 07/25/19  0859 07/26/19 0447 07/26/19 2040 07/27/19 0340  PHART 7.253* 7.207* 7.198* 7.176* 7.171* 7.180* 7.203*  PCO2ART 62.2* 69.6* 66.3* 76.7* 70.5* 78.6* 82.7*  PO2ART 65.0* 246.0* 63.0* 86.0 105.0 140* 169*  HCO3 27.8 27.8 26.0 28.3* 26.7 28.7* 31.3*  TCO2  $'30 30 28 31 29  'y$ --   --   O2SAT 89.0 100.0 86.0 93.0 97.0 98.6 98.8    CBC Recent Labs  Lab 07/25/19 0438  07/26/19 0447 07/26/19 0500 07/27/19 0429  HGB 9.6*   < > 10.2* 9.5* 9.9*  HCT 32.2*   < > 30.0* 32.1* 33.2*  WBC 13.7*  --   --  14.1* 10.9*  PLT 173  --   --  184 215   < > = values in this interval not displayed.    COAGULATION No results for input(s): INR in the last 168 hours.  CARDIAC  No results for input(s): TROPONINI in the last 168 hours. No results for input(s): PROBNP in the last 168 hours.   CHEMISTRY Recent Labs  Lab 07/23/19 0341  07/24/19 0427  07/25/19 0438  07/25/19 1600 07/26/19 0447 07/26/19 0500 07/26/19 1518 07/27/19 0429  NA  --    < > 138   < > 137   < > 136 135 136 136 139  K  --    < > 4.5   < > 3.6   < > 3.4* 3.6 3.6 3.9 3.8  CL  --    < > 103   < > 102  --  102  --  103 104 101  CO2  --    < > 27   < > 24  --  24  --  '26 26 31  '$ GLUCOSE  --    < > 125*   < > 126*  --  148*  --  149* 106* 84  BUN  --    < > 38*   < > 33*  --  38*  --  36* 31* 33*  CREATININE  --    < > 1.50*   < > 1.61*  --  1.62*  --  1.39* 1.46* 1.56*  CALCIUM  --    < > 8.1*   < > 8.4*  --  8.2*  --  8.3* 8.3* 8.4*  MG 2.6*  --  2.7*  --  2.7*  --   --   --  2.7*  --  2.7*  PHOS  --    < > 4.6   < > 3.4  --  3.5  --  3.5 3.2 3.0   < > = values in this interval not displayed.   Estimated Creatinine Clearance: 48.7 mL/min (A) (by C-G formula based on SCr of 1.56 mg/dL (H)).   LIVER Recent Labs  Lab 07/21/19 0337  07/22/19 0420  07/23/19 0341  07/24/19 0427  07/25/19 0438 07/25/19 1600 07/26/19 0500 07/26/19 1518 07/27/19 0429  AST 73*  --  118*  --  62*  --  40  --  31  --   --   --   --   ALT 97*   --  180*  --  142*  --  98*  --  74*  --   --   --   --   ALKPHOS 116  --  178*  --  181*  --  196*  --  203*  --   --   --   --   BILITOT 0.3  --  0.5  --  0.5  --  0.5  --  0.4  --   --   --   --   PROT 5.3*  --  5.9*  --  5.6*  --  5.8*  --  6.0*  --   --   --   --   ALBUMIN 1.6*  1.6*   < > 1.9*   < > 1.9*   < > 2.1*  2.1*   < > 2.3*  2.3* 2.2* 2.3* 2.3* 2.5*   < > = values in this interval not displayed.     INFECTIOUS Recent Labs  Lab 07/22/19 0940  PROCALCITON 0.52     ENDOCRINE CBG (last 3)  Recent Labs    07/27/19 0052 07/27/19 0421 07/27/19 0746  GLUCAP 154* 82 101*         IMAGING x48h  - image(s) personally visualized  -   highlighted in bold Dg Chest Port 1 View  Result Date: 07/25/2019 CLINICAL DATA:  COVID-19. Readjustment of ET tube. EXAM: PORTABLE CHEST 1 VIEW COMPARISON:  07/22/2019 FINDINGS: There is a right IJ catheter with tip in the projection of the SVC. The ET tube tip is above the carina by 4 cm. There is a feeding tube with tip below the level of the GE junction. Normal heart size. Asymmetric elevation of left hemidiaphragm as before. Small bilateral pleural effusions, unchanged. Bilateral interstitial and airspace opacities persist. Not significantly changed in the interval. IMPRESSION: 1. No change in bilateral interstitial and airspace opacities compatible with multifocal pneumonia. 2. Stable support apparatus. Electronically Signed   By: Kerby Moors M.D.   On: 07/25/2019 09:37

## 2019-07-28 DIAGNOSIS — J9600 Acute respiratory failure, unspecified whether with hypoxia or hypercapnia: Secondary | ICD-10-CM | POA: Diagnosis not present

## 2019-07-28 DIAGNOSIS — Z515 Encounter for palliative care: Secondary | ICD-10-CM

## 2019-07-28 DIAGNOSIS — N170 Acute kidney failure with tubular necrosis: Secondary | ICD-10-CM | POA: Diagnosis not present

## 2019-07-28 LAB — GLUCOSE, CAPILLARY
Glucose-Capillary: 117 mg/dL — ABNORMAL HIGH (ref 70–99)
Glucose-Capillary: 152 mg/dL — ABNORMAL HIGH (ref 70–99)
Glucose-Capillary: 153 mg/dL — ABNORMAL HIGH (ref 70–99)
Glucose-Capillary: 174 mg/dL — ABNORMAL HIGH (ref 70–99)

## 2019-07-28 LAB — RENAL FUNCTION PANEL
Albumin: 2.7 g/dL — ABNORMAL LOW (ref 3.5–5.0)
Anion gap: 8 (ref 5–15)
BUN: 33 mg/dL — ABNORMAL HIGH (ref 8–23)
CO2: 29 mmol/L (ref 22–32)
Calcium: 8.5 mg/dL — ABNORMAL LOW (ref 8.9–10.3)
Chloride: 98 mmol/L (ref 98–111)
Creatinine, Ser: 1.51 mg/dL — ABNORMAL HIGH (ref 0.61–1.24)
GFR calc Af Amer: 56 mL/min — ABNORMAL LOW (ref >60)
GFR calc non Af Amer: 48 mL/min — ABNORMAL LOW (ref >60)
Glucose, Bld: 127 mg/dL — ABNORMAL HIGH (ref 70–99)
Phosphorus: 2.8 mg/dL (ref 2.5–4.6)
Potassium: 3.6 mmol/L (ref 3.5–5.1)
Sodium: 135 mmol/L (ref 135–145)

## 2019-07-28 LAB — MAGNESIUM: Magnesium: 2.6 mg/dL — ABNORMAL HIGH (ref 1.7–2.4)

## 2019-07-28 LAB — HEPATIC FUNCTION PANEL
ALT: 61 U/L — ABNORMAL HIGH (ref 0–44)
AST: 45 U/L — ABNORMAL HIGH (ref 15–41)
Albumin: 2.6 g/dL — ABNORMAL LOW (ref 3.5–5.0)
Alkaline Phosphatase: 221 U/L — ABNORMAL HIGH (ref 38–126)
Bilirubin, Direct: <0.1 mg/dL (ref 0.0–0.2)
Total Bilirubin: 0.5 mg/dL (ref 0.3–1.2)
Total Protein: 6.7 g/dL (ref 6.5–8.1)

## 2019-07-28 LAB — LACTIC ACID, PLASMA: Lactic Acid, Venous: 1.2 mmol/L (ref 0.5–1.9)

## 2019-07-28 MED ORDER — HALOPERIDOL LACTATE 5 MG/ML IJ SOLN
0.5000 mg | INTRAMUSCULAR | Status: DC | PRN
  Administered 2019-07-28: 0.5 mg via INTRAVENOUS
  Filled 2019-07-28: qty 1

## 2019-07-28 MED ORDER — BIOTENE DRY MOUTH MT LIQD: 15.0000 mL | OROMUCOSAL | Status: DC | PRN

## 2019-07-28 MED ORDER — GLYCOPYRROLATE 0.2 MG/ML IJ SOLN: 0.2000 mg | INTRAMUSCULAR | Status: DC | PRN

## 2019-07-28 MED ORDER — ACETAMINOPHEN 650 MG RE SUPP: 650.0000 mg | Freq: Four times a day (QID) | RECTAL | Status: DC | PRN

## 2019-07-28 MED ORDER — ONDANSETRON HCL 4 MG/2ML IJ SOLN: 4.0000 mg | Freq: Four times a day (QID) | INTRAMUSCULAR | Status: DC | PRN

## 2019-07-28 MED ORDER — GLYCOPYRROLATE 1 MG PO TABS: 1.0000 mg | ORAL_TABLET | ORAL | Status: DC | PRN

## 2019-07-28 MED ORDER — ONDANSETRON 4 MG PO TBDP
4.0000 mg | ORAL_TABLET | Freq: Four times a day (QID) | ORAL | Status: DC | PRN
  Filled 2019-07-28: qty 1

## 2019-07-28 MED ORDER — GLYCOPYRROLATE 0.2 MG/ML IJ SOLN
0.6000 mg | Freq: Three times a day (TID) | INTRAMUSCULAR | Status: DC
  Administered 2019-07-28: 0.6 mg via INTRAVENOUS
  Filled 2019-07-28 (×2): qty 3

## 2019-07-28 MED ORDER — POLYVINYL ALCOHOL 1.4 % OP SOLN: 1.0000 [drp] | Freq: Four times a day (QID) | OPHTHALMIC | Status: DC | PRN

## 2019-07-28 MED ORDER — ACETAMINOPHEN 325 MG PO TABS: 650.0000 mg | ORAL_TABLET | Freq: Four times a day (QID) | ORAL | Status: DC | PRN

## 2019-07-28 MED ORDER — DIPHENHYDRAMINE HCL 50 MG/ML IJ SOLN: 25.0000 mg | INTRAMUSCULAR | Status: DC | PRN

## 2019-07-28 MED ORDER — HALOPERIDOL LACTATE 2 MG/ML PO CONC
0.5000 mg | ORAL | Status: DC | PRN
  Filled 2019-07-28: qty 0.3

## 2019-07-28 MED ORDER — HALOPERIDOL 0.5 MG PO TABS
0.5000 mg | ORAL_TABLET | ORAL | Status: DC | PRN
  Filled 2019-07-28: qty 1

## 2019-07-28 MED ORDER — DEXTROSE 5 % IV SOLN: INTRAVENOUS | Status: DC

## 2019-08-03 ENCOUNTER — Telehealth: Payer: Self-pay

## 2019-08-03 NOTE — Telephone Encounter (Signed)
Received dc from [employer]..    DC is for burial and a patient of Doctor Ramaswamy.   DC will be taken to Pulmonary Unit for signature.  On 08/07/2019 Received dc back from Doctor Elsworth Soho. I called the funeral home to let them know the dc is ready for pickup.

## 2019-08-25 NOTE — Progress Notes (Signed)
Assisted tele visit to patient with family member.  Lorine Iannaccone Anderson, RN   

## 2019-08-25 NOTE — Progress Notes (Signed)
RT NOTE: RT extubated patient to comfort care per MD order and family wishes, with RN at bedside.

## 2019-08-25 NOTE — Progress Notes (Addendum)
NAME:  Craig Mccoy, MRN:  [id number], DOB:  [birth year], LOS: 17 ADMISSION DATE:  06/28/2019, CONSULTATION DATE:  11/17 REFERRING MD:  Rogene Houston, CHIEF COMPLAINT:  Acute respiratory failure    History of present illness   Craig Mccoy a 63 y.o.malepresented to [name] 11/17 with dyspnea and respiratory distress. He had apparently tested positive for COVID 19 on 11/12 at an outside institution and had been in self isolation. Had multiple metabolic derangements including marked renal failure (SCr 14.63 with BUN 219), met acidosis + resp alkalosis (7.28, <19, 72). Developed ARDS & required CRRT    Past Medical History  MI, HTN  Significant Hospital Events   11/17 - transferred to [facility] from [facility]  11/19 - possible seizure like activity witnessed by RN 11/25 CRRT, proning 11/26 no proning 11/27 plugged off left mainstem requiring urgent bronch, switch off paralytics, switch fentanyl to dilaudid + ketamine 11/28 proning, re-paralyzed, inability to ventilate despite all aggressive measures, 2 physician DNR 11/29 survived night, mildly improved ventilation 11/30 increasing pressor requirements, 80% FiO2, High PP  12/2 - He is critically ill, intubated. Remains on 70%/PEEP of 14 Ketamine has been tapered off, remains on Dilaudid and Versed On CRRT, remains anuric Hypotensive this morning on increasing doses of Levophed  12./3 - pall care consult 12/2 - family struggling between reality and hope and guilt about terminal wean decisions. Severely acidotic overnight. On vent 70% fio2, dilaudid gtt, versed gtt, dilaudid gtt, leviophed gtt, bic gtt, CRRT. Bbic increased overnight due to referactory metabolic acidosis. Smell of body rot in the room . Per RN - son ready for making patient comfort. Daughter not ready . Mom also died of covid   Consults:  Nephrology Neurology  Procedures/Tests/Lines:  ETT 11/17 >> R femoral CVC 06/26/2019 >> R IJ HD catheter 07/23/2019 >> Rectal tube 07/20/19  Cortrak 07/19/19 R radial arterial line 07/18/19  cEEG 11/20 - 11/22 - diffuse encephalopathy. No seizures.  CXR 11/28 ARDS Echo 11/18 EF 35% LE duplex 11/20 no DVT CT head - no acute abnormality MRI brain 11/21 No acute abnormality   Micro Data:  Resp culture 11/28>> rare GPC, reincubated for better growth Blood Cx - 11/17 - NG4D  Respiratory Cx 11/17 - moraxella catarrhalis Urine Cx 11/17 - no growth MRSA pcr - negative COVID 11/17 - positive  Antimicrobials:  CTX - 11/18 x 5 days Azithro - complete Remdesivir 11/17 >> 11/21 Actemra 11/24 x 1 Vancomycin 11/28>>11/30 Cefepime 11/28>>  Interim history/subjective:   2019/08/25  -  No real change. On pressors, sedation , bic gtt, CRRT. BP getting worse. Druing writing of this note - SBP deteriorated despite levophed   Objective   Blood pressure (!) 98/57, pulse 81, temperature (!) 96.8 F (36 C), resp. rate (!) 30, height _0  (1.676 m), weight 77 kg, SpO2 100 %.    Vent Mode: PRVC FiO2 (%):  [70 %] 70 % Set Rate:  [30 bmp] 30 bmp Vt Set:  [400 mL] 400 mL PEEP:  [14 cmH20] 14 cmH20 Plateau Pressure:  [23 cmH20-37 cmH20] 35 cmH20   Intake/Output Summary (Last 24 hours) at 2019-08-25 1015 Last data filed at 08/25/2019 1000 Gross per 24 hour  Intake 4392.68 ml  Output 6013 ml  Net -1620.32 ml   Filed Weights   07/26/19 0444 07/27/19 0429 08-25-19 0435  Weight: 83.5 kg 84 kg 77 kg       General Appearance:  Looks criticall ill Head:  Normocephalic, without obvious abnormality, atraumatic Eyes:  PERRL - yes, conjunctiva/corneas - muddy     Ears:  Normal external ear canals, both ears Nose:  G tube - no Throat:  ETT TUBE - yes , OG tube - yes Neck:  Supple,  No enlargement/tenderness/nodules Lungs: Clear to auscultation bilaterally, Ventilator   Synchrony - yes Heart:  S1 and S2 normal, no murmur, CVP - x.  Pressors - yes Abdomen:  Soft, no masses, no organomegaly Genitalia / Rectal:  Not done Extremities:   Extremities- intact Skin:  ntact in exposed areas . Sacral area - not done Neurologic:  Sedation - dilaudid and versed gtt -> RASS - -5       Resolved Hospital Problem list     Assessment & Plan:  # ARDS due to COVID-19, m catarrhalis PNA -  s/p azithro (11/18-11/21) and CTX (11/17 - 11/21).   Also s/p remdesivir, actemra x 1, steroids x 10 days.     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 Shock-septic versus cardiogenic/acidosis related   - Aug 21, 2019 - on levophed back at 68mg/hr and deteriorating 10:56 AM  Plan  - levophed for MAP > 65 (dose capped at 264m by prioer CCM MD Dr AlElsworth Sohoased on family discussions) -  - no further pressors if deteriorates  # Rising D dimer-full dose Lovenox while on CRRT   # Acute encephalopathy - one episode of seizure like activity on 11/18. No further episodes seen. EEG did not show epileptiform activity, but did show diffuse encephalopathy. Neurology following. MRI brain 11/21 No acute abnormality.    122020/12/28 RASS -5  Plan  --Maintain deep sedation since vecuronium being used intermittently for vent asynchrony -Ketamine was discontinued, maintain Versed and Dilaudid drips -Oxycodone, Seroquel and clonazepam , can titrate to goal RASS -5    # AKI - related to acute illness, question of cardiorenal syndrome ,   1212-28-2020 still anuric  Plan - on CRRT per renal    # Hyperglycemia-SSI, CBG goal less than 180   # Anemia-no Sn/Sx bleeding. Likely of critical illness  12Dec 28, 2020 no active bleeding.   Plan  - - PRBC for hgb </= 6.9gm%    - exceptions are   -  if ACS susepcted/confirmed then transfuse for hgb </= 8.0gm%,  or    -  active bleeding with hemodynamic  instability, then transfuse regardless of hemoglobin value   At at all times try to transfuse 1 unit prbc as possible with exception of active hemorrhage   #VAP  Anti-infectives (From admission, onward)   Start     Dose/Rate Route Frequency Ordered Stop   07/23/19 1400  vancomycin (VANCOCIN) IVPB 1000 mg/200 mL premix  Status:  Discontinued     1,000 mg 200 mL/hr over 60 Minutes Intravenous Every 24 hours 07/22/19 1045 07/24/19 1225   07/22/19 2200  ceFEPIme (MAXIPIME) 2 g in sodium chloride 0.9 % 100 mL IVPB     2 g 200 mL/hr over 30 Minutes Intravenous Every 12 hours 07/22/19 1045     07/22/19 1000  vancomycin (VANCOCIN) 1,750 mg in sodium chloride 0.9 % 500 mL IVPB     1,750 mg 250 mL/hr over 120 Minutes Intravenous  Once 07/22/19 0946 07/22/19 1322   07/22/19 1000  ceFEPIme (  MAXIPIME) 2 g in sodium chloride 0.9 % 100 mL IVPB     2 g 200 mL/hr over 30 Minutes Intravenous  Once 07/22/19 0946 07/22/19 1416   07/12/19 2200  cefTRIAXone (ROCEPHIN) 1 g in sodium chloride 0.9 % 100 mL IVPB  Status:  Discontinued     1 g 200 mL/hr over 30 Minutes Intravenous Every 24 hours 07/16/2019 2303 07/02/2019 2356   07/12/19 2200  remdesivir 100 mg in sodium chloride 0.9 % 250 mL IVPB     100 mg 500 mL/hr over 30 Minutes Intravenous Every 24 hours 07/10/2019 2312 07/15/19 2316   07/12/19 0015  cefTRIAXone (ROCEPHIN) 2 g in sodium chloride 0.9 % 100 mL IVPB     2 g 200 mL/hr over 30 Minutes Intravenous Daily at bedtime 07/01/2019 2356 07/15/19 2231   07/12/19 0000  azithromycin (ZITHROMAX) 500 mg in sodium chloride 0.9 % 250 mL IVPB     500 mg 250 mL/hr over 60 Minutes Intravenous Every 24 hours 06/27/2019 2303 07/15/19 0207   07/12/19 0000  remdesivir 200 mg in sodium chloride 0.9 % 250 mL IVPB     200 mg 500 mL/hr over 30 Minutes Intravenous Once 07/15/2019 2312 07/12/19 0144   07/05/2019 2315  cefTRIAXone (ROCEPHIN) 1 g in sodium chloride 0.9 % 100 mL IVPB  Status:  Discontinued     1 g 200 mL/hr over 30  Minutes Intravenous  Once 07/18/2019 2306 07/12/2019 2355       Best practice:  Diet: TF Pain/Anxiety/Delirium protocol (if indicated): PAD protocol VAP protocol (if indicated): in place DVT prophylaxis: lovenox GI prophylaxis: pepcid Glucose control: levemir w/ rSSI Mobility: Bedrest Code Status: DNR    -  Goals of care discussion with family 12/1-DNR issued, but they would like to continue current forms of life support including dialysis and mechanical ventilation to see if he has any recovery over the next few days.  Have involved palliative care for further discussion  Family Communication:updated daughter Caryl Pina & son Myrtis Hopping 12/1.  -   On 07/27/2019 - Dr Chanetta Marshall d/w daugghter - explained patient is actively dying and process is irreversible. Explained that  Current active care is prolonging the irreversible dying process. Daughter is fully aware of the reality. Says the son/her brother is in favor of comfort care and terminal wean but she is struggling to come to terms with reality. She is going to reflect on our conversation, talk to her brother and pray for solace. We will reconvene with her  On 2019/08/23 - Dr Chase Caller left message with daughter to call back (10:43 AM)  Disposition: ICU    .  ATTESTATION & SIGNATURE   The patient Honest Vanleer is critically ill with multiple organ systems failure and requires high complexity decision making for assessment and support, frequent evaluation and titration of therapies, application of advanced monitoring technologies and extensive interpretation of multiple databases.   Critical Care Time devoted to patient care services described in this note is  30  Minutes. This time reflects time of care of this signee Dr Brand Males. This critical care time does not reflect procedure time, or teaching time or supervisory time of PA/NP/Med student/Med Resident etc but could involve care discussion time     Dr. Brand Males, M.D.,  [facility].C.P Pulmonary and Critical Care Medicine Staff Physician [place] [facility] Pager: [phone], If no answer or between  15:00h - 7:00h: call [phone]  August 23, 2019  10:40 AM      LABS    PULMONARY Recent Labs  Lab 07/22/19 0351 07/23/19 0330 07/24/19 1359 07/25/19 0859 07/26/19 0447 07/26/19 2040 07/27/19 0340  PHART 7.253* 7.207* 7.198* 7.176* 7.171* 7.180* 7.203*  PCO2ART 62.2* 69.6* 66.3* 76.7* 70.5* 78.6* 82.7*  PO2ART 65.0* 246.0* 63.0* 86.0 105.0 140* 169*  HCO3 27.8 27.8 26.0 28.3* 26.7 28.7* 31.3*  TCO2 _0 --   --   O2SAT 89.0 100.0 86.0 93.0 97.0 98.6 98.8    CBC Recent Labs  Lab 07/25/19 0438  07/26/19 0447 07/26/19 0500 07/27/19 0429  HGB 9.6*   < > 10.2* 9.5* 9.9*  HCT 32.2*   < > 30.0* 32.1* 33.2*  WBC 13.7*  --   --  14.1* 10.9*  PLT 173  --   --  184 215   < > = values in this interval not displayed.    COAGULATION No results for input(s): INR in the last 168 hours.  CARDIAC  No results for input(s): TROPONINI in the last 168 hours. No results for input(s): PROBNP in the last 168 hours.   CHEMISTRY Recent Labs  Lab 07/24/19 0427  07/25/19 0438  07/26/19 0500 07/26/19 1518 07/27/19 0429 07/27/19 1636 2019-08-10 0432 2019/08/10 0433  NA 138   < > 137   < > 136 136 139 138 135  --   K 4.5   < > 3.6   < > 3.6 3.9 3.8 4.0 3.6  --   CL 103   < > 102   < > 103 104 101 100 98  --   CO2 27   < > 24   < > _1 --   GLUCOSE 125*   < > 126*   < > 149* 106* 84 149* 127*  --   BUN 38*   < > 33*   < > 36* 31* 33* 32* 33*  --   CREATININE 1.50*   < > 1.61*   < > 1.39* 1.46* 1.56* 1.53* 1.51*  --   CALCIUM 8.1*   < > 8.4*   < > 8.3* 8.3* 8.4* 8.3* 8.5*  --   MG 2.7*  --  2.7*  --  2.7*  --  2.7*  --   --  2.6*  PHOS 4.6   < > 3.4   < > 3.5 3.2 3.0 3.2 2.8  --    < > = values in this interval not displayed.   Estimated Creatinine Clearance: 48.3 mL/min (A) (by C-G formula based  on SCr of 1.51 mg/dL (H)).   LIVER Recent Labs  Lab 07/22/19 0420  07/23/19 0341  07/24/19 0427  07/25/19 0438  07/26/19 0500 07/26/19 1518 07/27/19 0429 07/27/19 1636 08-10-2019 0432  AST 118*  --  62*  --  40  --  31  --   --   --   --   --   --   ALT 180*  --  142*  --  98*  --  74*  --   --   --   --   --   --   ALKPHOS 178*  --  181*  --  196*  --  203*  --   --   --   --   --   --   BILITOT 0.5  --  0.5  --  0.5  --  0.4  --   --   --   --   --   --  PROT 5.9*  --  5.6*  --  5.8*  --  6.0*  --   --   --   --   --   --   ALBUMIN 1.9*   < > 1.9*   < > 2.1*  2.1*   < > 2.3*  2.3*   < > 2.3* 2.3* 2.5* 2.6* 2.7*   < > = values in this interval not displayed.     INFECTIOUS Recent Labs  Lab 07/22/19 0940 2019/08/19 0000  LATICACIDVEN  --  1.2  PROCALCITON 0.52  --      ENDOCRINE CBG (last 3)  Recent Labs    Aug 19, 2019 0028 08/19/19 0425 August 19, 2019 0741  GLUCAP 174* 117* 152*         IMAGING x48h  - image(s) personally visualized  -   highlighted in bold No results found.

## 2019-08-25 NOTE — Discharge Summary (Signed)
DISCHARGE SUMMARY    Date of admit: 07/08/2019 12:11 PM Date of discharge: 2019-08-02  7:11 PM Length of Stay: 17 days  PCP is Celene Squibb, MD  CAUSE(S) OF DEATH  COVID-19 related ARDS   PROBLEM LIST Active Problems:   Pneumonia due to COVID-19 virus   Acute renal failure ([place])   Acute respiratory failure with hypoxia ([place])   ARDS (adult respiratory distress syndrome) ([place])   COVID-19 virus infection   Palliative care by specialist   Goals of care, counseling/discussion   Acute respiratory failure ([place])   Comfort measures only status   Palliative care encounter    SUMMARY Craig Mccoy was 64 y.o. patient with    has a past medical history of Hypertension and MI (myocardial infarction) ([place]).   has a past surgical history that includes Colonoscopy (N/A, 06/22/2014).   Admitted on 07/07/2019 with   Craig Mccoy a 64 y.o.malepresented to [name] 11/17 with dyspnea and respiratory distress. He had apparently tested positive for COVID 19 on 11/12 at an outside institution and had been in self isolation. Had multiple metabolic derangements including marked renal failure (SCr 14.63 with BUN 219), met acidosis + resp alkalosis (7.28, <19, 72). Developed ARDS & required CRRT     Significant Hospital Events   11/17 - transferred to [facility] from [place]  11/19 - possible seizure like activity witnessed by RN 11/25 CRRT, proning 11/26 no proning 11/27 plugged off left mainstem requiring urgent bronch, switch off paralytics, switch fentanyl to dilaudid + ketamine 11/28 proning, re-paralyzed, inability to ventilate despite all aggressive measures, 2 physician DNR 11/29 survived night, mildly improved ventilation 11/30 increasing pressor requirements, 80% FiO2, High PP  12/2 - He is critically ill, intubated. Remains on 70%/PEEP of 14 Ketamine has been tapered off, remains on Dilaudid and Versed On CRRT, remains anuric Hypotensive this morning on increasing doses  of Levophed  12./3 - pall care consult 12/2 - family struggling between reality and hope and guilt about terminal wean decisions. Severely acidotic overnight. On vent 70% fio2, dilaudid gtt, versed gtt, dilaudid gtt, leviophed gtt, bic gtt, CRRT. Bbic increased overnight due to referactory metabolic acidosis. Smell of body rot in the room . Per RN - son ready for making patient comfort. Daughter not ready . Mom also died of covid   On 08/02/23 - continued multi organ failure with worsening of refractory circulatory shock. Palliative care team discussed with family and terminal wean performed and patient expired 2019/08/02     SIGNED Dr. Brand Males, M.D., F.C.C.P Pulmonary and Critical Care Medicine Staff Physician [place] [facility] Pager: [phone], If no answer or between  15:00h - 7:00h: call [phone]  08/04/2019 12:40 PM

## 2019-08-25 NOTE — Progress Notes (Signed)
Patient ID: Craig Mccoy, male   DOB: 1956-04-09, 64 y.o.   MRN: [id number] [facility] Progress Note   Assessment/ Plan:    1. Acute kidney injury- unclear baseline presented with covid PNA and sepsis with BUN/Cr of 219/14.63. Started CRRT 11/17-11/18 with initial improvement of renal function but worsened and restarted CRRT 07/19/19 with efforts at ultrafiltration (net -50 cc an hour).  Overall poor prognosis noted and family struggling to decide on trajectory of care.  Continue current CRRT prescription until definitive decision made regarding transitioning to comfort care. 2. COVID-19 pneumonia with sepsis and ARDS- s/p remdesivir, decadron per PCCM 3. Ventilator dependent respiratory failure with ARDS- attempt to lower FiO2 per PCCM.  Worsening acidemia and started on bicarb drip per PCCM.   4. Sepsis-suspected to be distributive versus cardiogenic from Covid myocarditis. On levophed 5. Anemia of critical illness-  6. Disposition- poor overall prognosis. Currently DNR and will need ongoing discussions with family as he has not significantly improved despite maximal efforts.  Subjective:   No acute events overnight, weaning down on Levophed   Objective:   BP 101/62   Pulse 80   Temp (!) 95.4 F (35.2 C)   Resp (!) 30   Ht _0  (1.676 m)   Wt 77 kg   SpO2 100%   BMI 27.40 kg/m   Intake/Output Summary (Last 24 hours) at 08/17/19 0701 Last data filed at 08-17-2019 0600 Gross per 24 hour  Intake 4185.07 ml  Output 5796 ml  Net -1610.93 ml   Weight change: -7 kg  Physical Exam: Gen: Intubated/sedated CVS: Pulse regular rhythm, normal rate, S1 and S2 normal Resp: Coarse breath sounds bilaterally, on ventilator Abd: Soft, mild distention noted, nontender Ext: Trace/1+ dependent edema.  Right IJ temporary catheter.  Imaging: No results found.  Labs: BMET Recent Labs  Lab 07/25/19 0438  07/25/19 1600 07/26/19 0447 07/26/19 0500 07/26/19 1518  07/27/19 0429 07/27/19 1636 08-17-2019 0432  NA 137   < > 136 135 136 136 139 138 135  K 3.6   < > 3.4* 3.6 3.6 3.9 3.8 4.0 3.6  CL 102  --  102  --  103 104 101 100 98  CO2 24  --  24  --  _1 GLUCOSE 126*  --  148*  --  149* 106* 84 149* 127*  BUN 33*  --  38*  --  36* 31* 33* 32* 33*  CREATININE 1.61*  --  1.62*  --  1.39* 1.46* 1.56* 1.53* 1.51*  CALCIUM 8.4*  --  8.2*  --  8.3* 8.3* 8.4* 8.3* 8.5*  PHOS 3.4  --  3.5  --  3.5 3.2 3.0 3.2 2.8   < > = values in this interval not displayed.   CBC Recent Labs  Lab 07/24/19 0427  07/25/19 0438 07/25/19 0859 07/26/19 0447 07/26/19 0500 07/27/19 0429  WBC 10.1  --  13.7*  --   --  14.1* 10.9*  HGB 9.9*   < > 9.6* 10.5* 10.2* 9.5* 9.9*  HCT 34.0*   < > 32.2* 31.0* 30.0* 32.1* 33.2*  MCV 81.0  --  79.5*  --   --  80.3 79.6*  PLT 175  --  173  --   --  184 215   < > = values in this interval not displayed.    Medications:    . ascorbic acid  250 mg Per Tube Daily  . aspirin  81  mg Per Tube Daily  . chlorhexidine gluconate (MEDLINE KIT)  15 mL Mouth Rinse BID  . Chlorhexidine Gluconate Cloth  6 each Topical Daily  . clonazepam  1 mg Per Tube BID  . enoxaparin (LOVENOX) injection  0.8 mg/kg Subcutaneous Q24H  . famotidine  20 mg Per Tube Q24H  . feeding supplement (PRO-STAT SUGAR FREE 64)  60 mL Per Tube TID  .  HYDROmorphone (DILAUDID) injection  1 mg Intravenous Once  . insulin aspart  0-20 Units Subcutaneous Q4H  . insulin detemir  30 Units Subcutaneous BID  . mouth rinse  15 mL Mouth Rinse 10 times per day  . oxyCODONE  10 mg Per Tube Q8H  . pravastatin  10 mg Per Tube Daily  . QUEtiapine  100 mg Per Tube BID  . sodium chloride flush  10-40 mL Intracatheter Q12H  . zinc sulfate  220 mg Per Tube Daily   Elmarie Shiley, MD 08/01/19, 7:01 AM

## 2019-08-25 NOTE — Progress Notes (Signed)
Daily Progress Note   Patient Name: Craig Mccoy       Date: Aug 02, 2019 DOB: 1955/10/14  Age: 64 y.o. MRN#: [id number] Attending Physician: Candee Furbish, MD Primary Care Physician: Celene Squibb, MD Admit Date: 06/28/2019  Reason for Consultation/Follow-up: Establishing goals of care and Psychosocial/spiritual support   Chart reviewed.  Discussed with CCM MD.  Discussed with ICU RN.  Visited Room to visualize patient.but did not go into fully examine patient.  Subjective: Spoke with Caryl Pina and [employer] on [employer]  Explained that we are giving their father all of the treatment that we can possibly give him and he is dying.  Requested they come to the hospital to visit him.  Fortunately they were planning to come in this afternoon anyway.   Explained that we will keep the machines running while they visit but afterward we will let him rest and turn the machines off.  Both adult children understand and are accepting.  Caryl Pina states that she is not surprised as the doctors have been keeping the family updated.  Spoke with front desk to ensure both family members will be allowed thru security.   Assessment: Patient actively dying.  Extremely hypotensive on 3 pressors.  Intubated.  On CRRT unable to remove fluid   Patient Profile/HPI:  64 y.o.malewith past medical history of gout, HTN, MI,admitted on11/17/2020with respiratory distress.Tested COVID+ prior to admission- was confirmed on admission. Recovery has been complicated by findings of heart failure- 35% on 11/18 ECHO, encephalopathy (EEG negative for seizure- showed profound diffuse encephalopathy- nonspecific to etiology), ARDS with onging vent dependence- not able to wean, asynchrony requiring repeat paralytics, renal and  hepatic failure- now on CRRT. Also requiring IV pressors to maintain BP. Dr. Elsworth Soho had [place] discussion with family on 12/1 which resulted in agreement to DNR, Palliative consulted for further [place].    Length of Stay: 17  Current Medications: Scheduled Meds:   Chlorhexidine Gluconate Cloth  6 each Topical Daily   clonazepam  1 mg Per Tube BID   glycopyrrolate  0.6 mg Intravenous TID    HYDROmorphone (DILAUDID) injection  1 mg Intravenous Once   mouth rinse  15 mL Mouth Rinse 10 times per day   oxyCODONE  10 mg Per Tube Q8H   QUEtiapine  100 mg Per Tube BID  sodium chloride flush  10-40 mL Intracatheter Q12H    Continuous Infusions:   prismasol BGK 4/2.5 500 mL/hr at 07/27/19 1644    prismasol BGK 4/2.5 300 mL/hr at 07/27/19 1027   sodium chloride Stopped (07/23/19 1445)   dextrose     HYDROmorphone 4 mg/hr (Aug 02, 2019 1200)   midazolam 10 mg/hr (08/02/19 1200)   prismasol BGK 4/2.5 1,500 mL/hr at August 02, 2019 1057    sodium bicarbonate (isotonic) infusion in sterile water 100 mL/hr at 08/02/2019 1200    PRN Meds: Place/Maintain arterial line **AND** sodium chloride, acetaminophen (TYLENOL) oral liquid 160 mg/5 mL, acetaminophen **OR** acetaminophen, albuterol, antiseptic oral rinse, diphenhydrAMINE, glycopyrrolate **OR** glycopyrrolate **OR** glycopyrrolate, haloperidol **OR** haloperidol **OR** haloperidol lactate, HYDROmorphone, midazolam, ondansetron **OR** ondansetron (ZOFRAN) IV, polyvinyl alcohol, sodium chloride flush  Physical Exam        Well developed male intubated on CRRT in ICU.  Eyes open but not seeing.  Not responsive.  Vital Signs: BP (!) 69/40    Pulse (!) 103    Temp 97.9 F (36.6 C)    Resp (!) 29    Ht 5\' 6"  (1.676 m)    Wt 77 kg    SpO2 99%    BMI 27.40 kg/m  SpO2: SpO2: 99 % O2 Device: O2 Device: Ventilator O2 Flow Rate: O2 Flow Rate (L/min): 15 L/min  Intake/output summary:   Intake/Output Summary (Last 24 hours) at 2019-08-02 1203 Last data  filed at 08-02-19 1200 Gross per 24 hour  Intake 4838.27 ml  Output 5901 ml  Net -1062.73 ml   LBM: Last BM Date: 08-02-19 Baseline Weight: Weight: 81.6 kg Most recent weight: Weight: 77 kg       Palliative Assessment/Data: 10%      Patient Active Problem List   Diagnosis Date Noted   ARDS (adult respiratory distress syndrome) ([place])    COVID-19 virus infection    Palliative care by specialist    Goals of care, counseling/discussion    Acute respiratory failure ([place])    Acute renal failure ([place])    Acute respiratory failure with hypoxia ([place])    Pneumonia due to COVID-19 virus 06/25/2019   Rectal bleeding 06/14/2014    Palliative Care Plan    Recommendations/Plan:  Family coming in to say goodbye. Comfort orders placed.  Rn will direct extubation after family visit. PMT will follow with you.  Goals of Care and Additional Recommendations:  Limitations on Scope of Treatment: Full Comfort Care  Code Status:  DNR  Prognosis:   Hours - Days   Discharge Planning:  Anticipated Hospital Death  Care plan was discussed with MD, RN, Family.  Thank you for allowing the Palliative Medicine Team to assist in the care of this patient.  Total time spent:  60 min.     Greater than 50%  of this time was spent counseling and coordinating care related to the above assessment and plan.  Florentina Jenny, PA-C Palliative Medicine  Please contact Palliative MedicineTeam phone at [phone] for questions and concerns between 7 am - 7 pm.   Please see AMION for individual provider pager numbers.

## 2019-08-25 DEATH — deceased

## 2020-05-27 IMAGING — DX DG CHEST 1V PORT
1 series · 1 of 1 positions shown · non-contrast
Comparison: 07/11/2019.

CLINICAL DATA: 63-year-old male central line placement.

EXAM:
PORTABLE CHEST 1 VIEW

[chest]
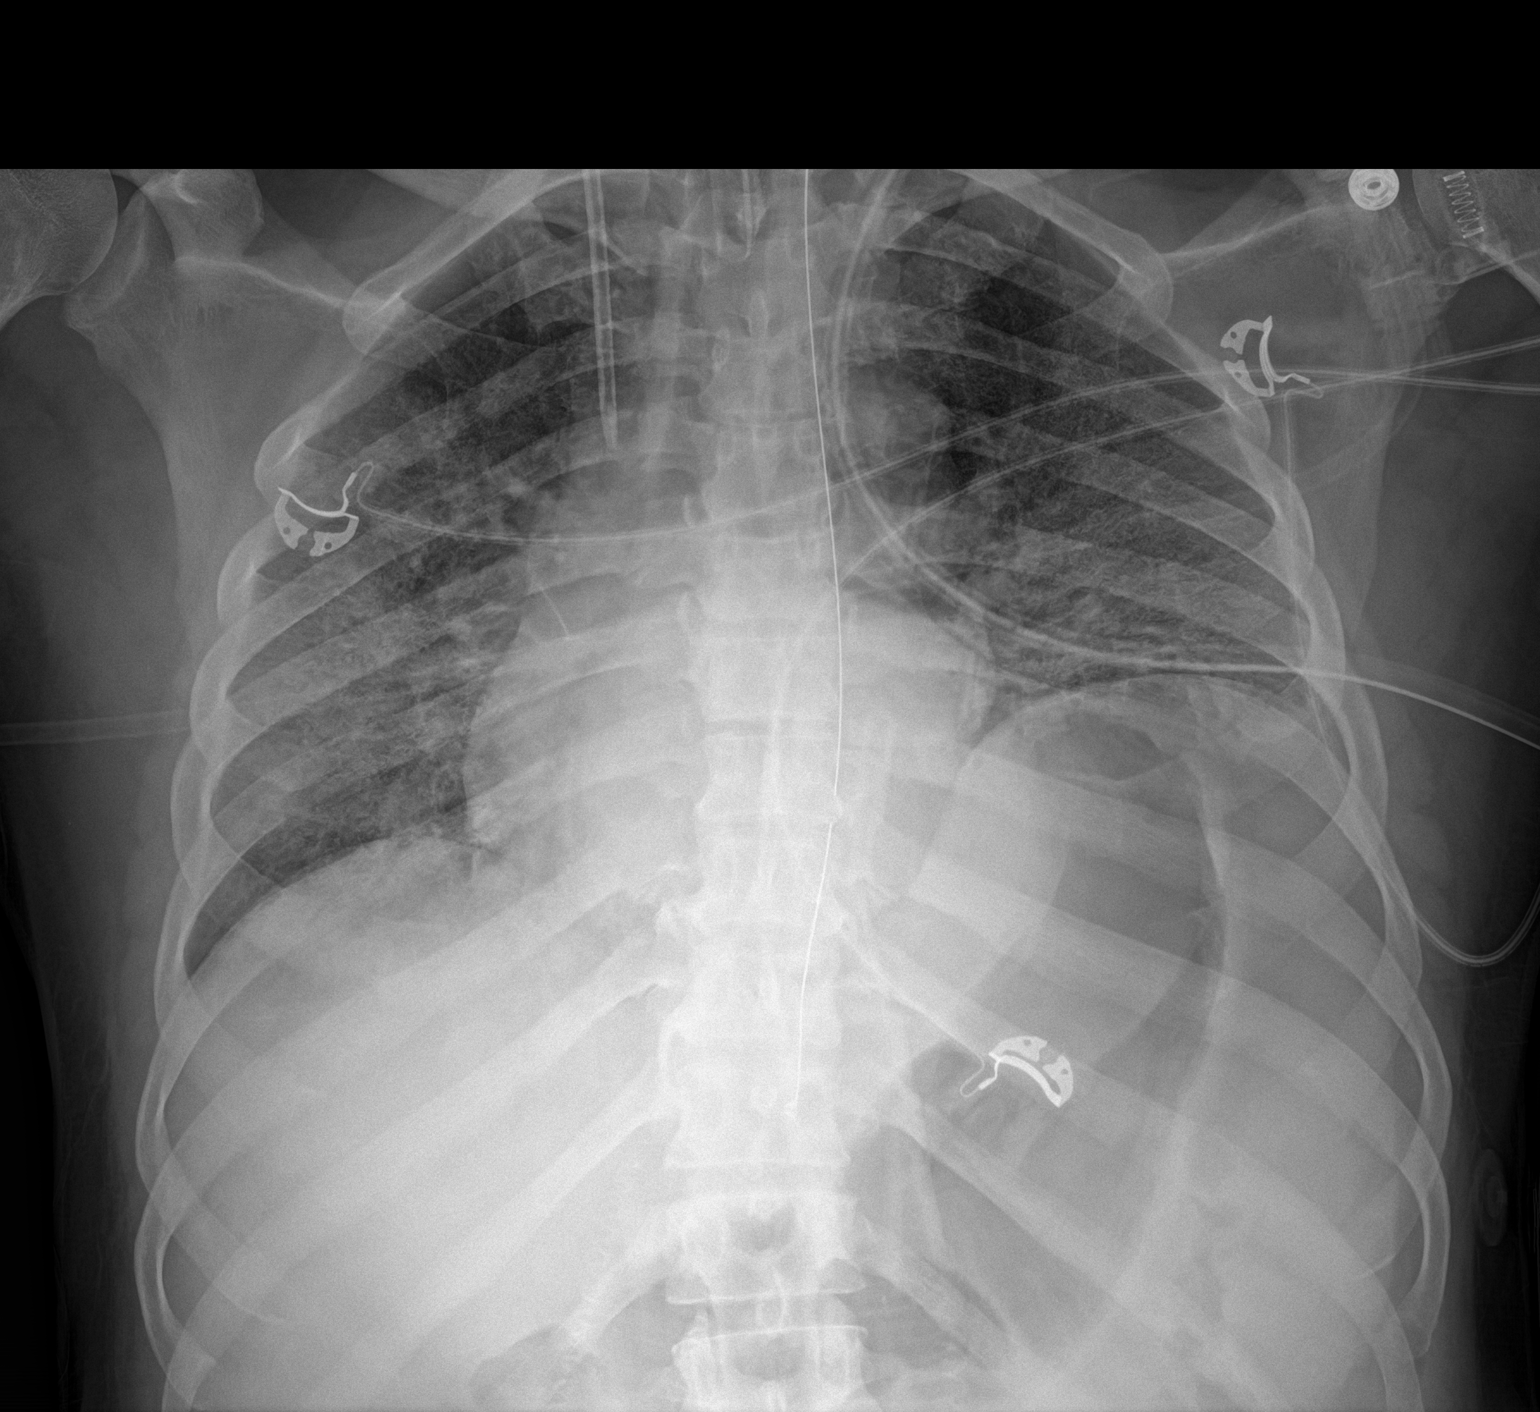

[1 of 1 positions shown; findings below may reference images not displayed]

FINDINGS: Portable AP semi upright view at 8837 hours. Stable endotracheal
tube tip at the level the clavicles. Enteric tube courses to the
abdomen in the midline, side hole is at the level of the distal
thoracic esophagus. Right IJ approach central line placed, tip at
the level of the SVC.

No pneumothorax. Stable cardiac size and mediastinal contours.
Stable low lung volumes with elevated left hemidiaphragm. Coarse
bilateral basilar predominant pulmonary interstitial opacity.
Negative visible bowel gas pattern.
IMPRESSION: 1. Right IJ central line placed, tip at the level of the SVC. No
pneumothorax.
2. Enteric tube side hole remains in the distal thoracic esophagus.
Advance 6 cm to ensure side hole placement within the stomach.
3. Otherwise stable lines and tubes. Stable ventilation.

## 2020-05-27 IMAGING — DX DG CHEST 1V PORT
1 series · 1 of 1 positions shown · non-contrast
Comparison: 07/12/2019

CLINICAL DATA: Respiratory failure.  Intubated patient.

EXAM:
PORTABLE CHEST 1 VIEW

[chest]
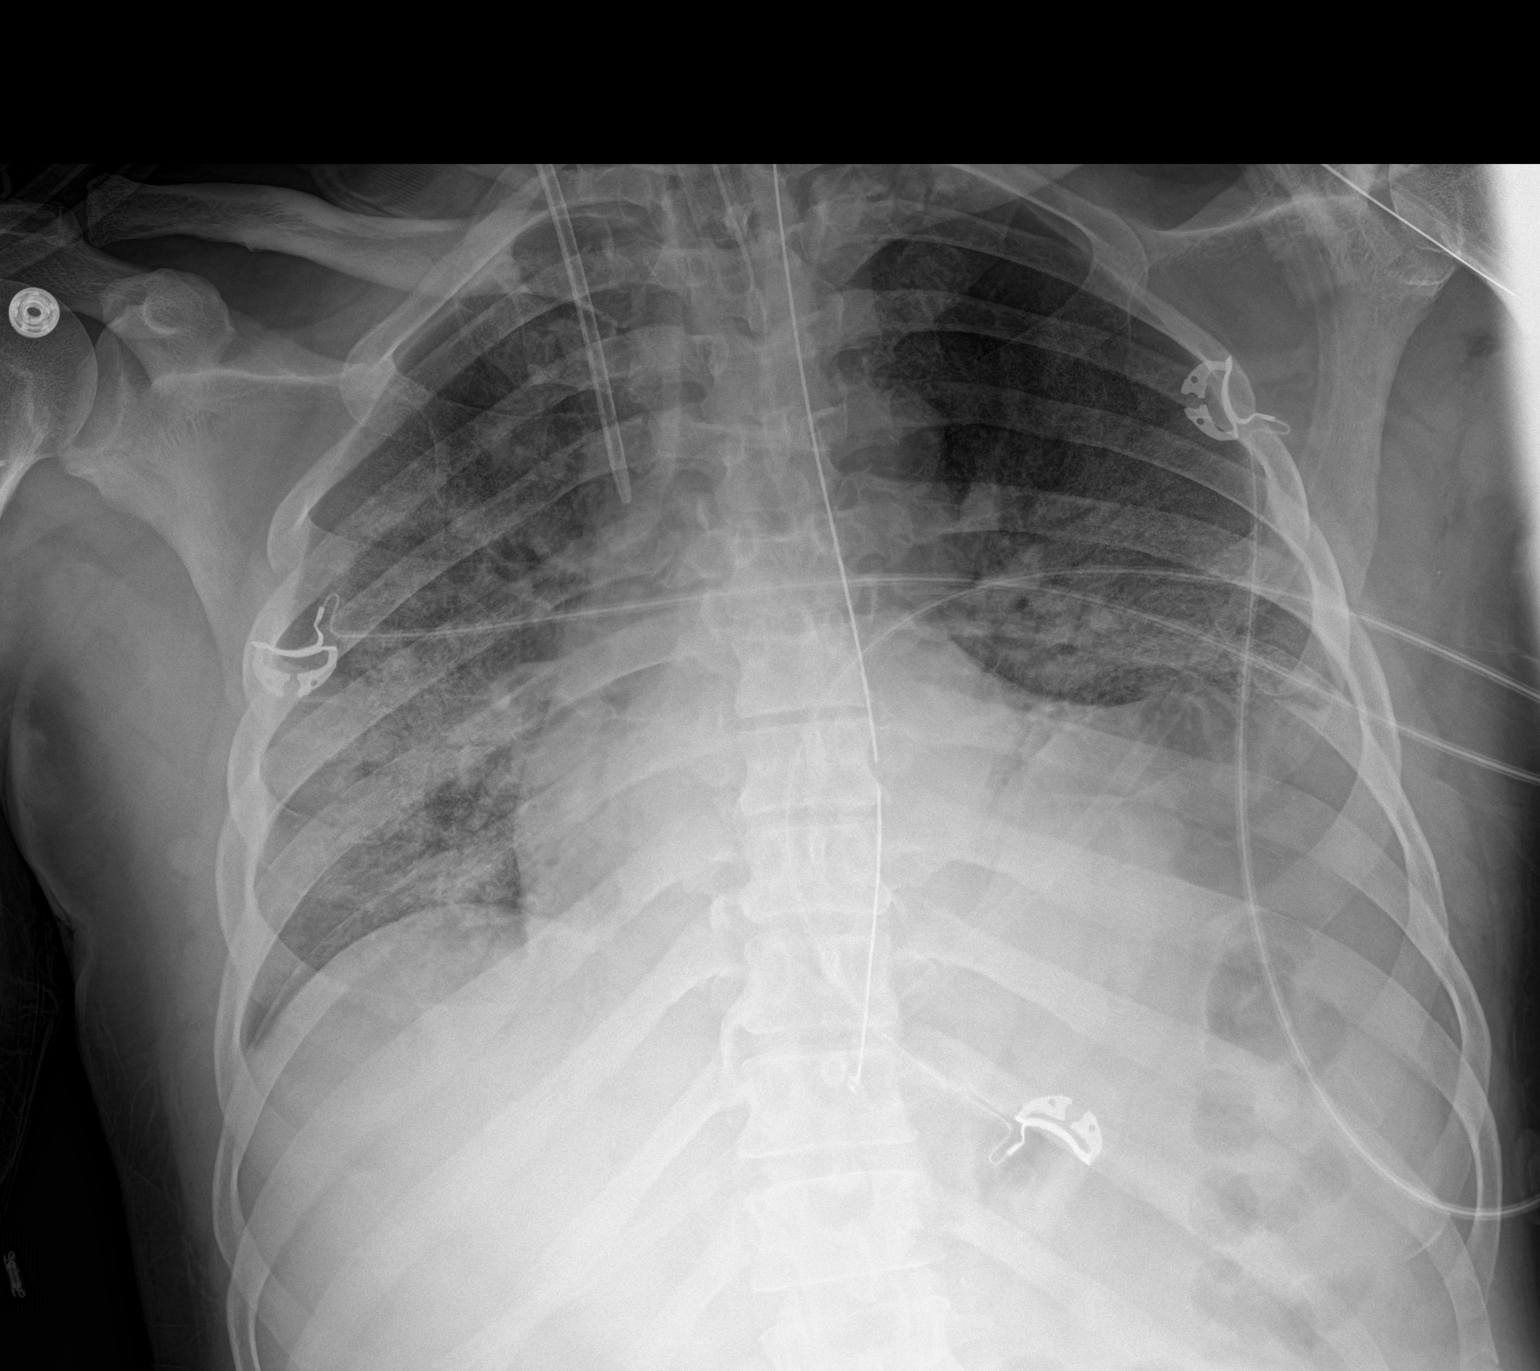

[1 of 1 positions shown; findings below may reference images not displayed]

FINDINGS: Endotracheal tube is 5.6 cm above the carina. Right jugular central
line with the tip in the upper SVC region. Nasogastric tube tip
terminates near the GE junction. Nasogastric tube side hole
continues to be in the distal esophagus region. Again noted is
elevation of left hemidiaphragm. Bilateral airspace densities, right
side greater than left. Airspace disease has minimally changed.
Heart size is within normal limits. Negative for a pneumothorax.
IMPRESSION: 1. Minimal change in the bilateral airspace disease. Findings are
compatible with pneumonia.
2. Nasogastric tube tip is near the GE junction and recommend
advancing the tube if possible.
3. Endotracheal tube is stable in position.

## 2020-06-01 IMAGING — DX DG CHEST 1V PORT
1 series · 1 of 1 positions shown · non-contrast
Comparison: Film from earlier in the same day.

CLINICAL DATA: Check endotracheal tube placement

EXAM:
PORTABLE CHEST 1 VIEW

[chest]
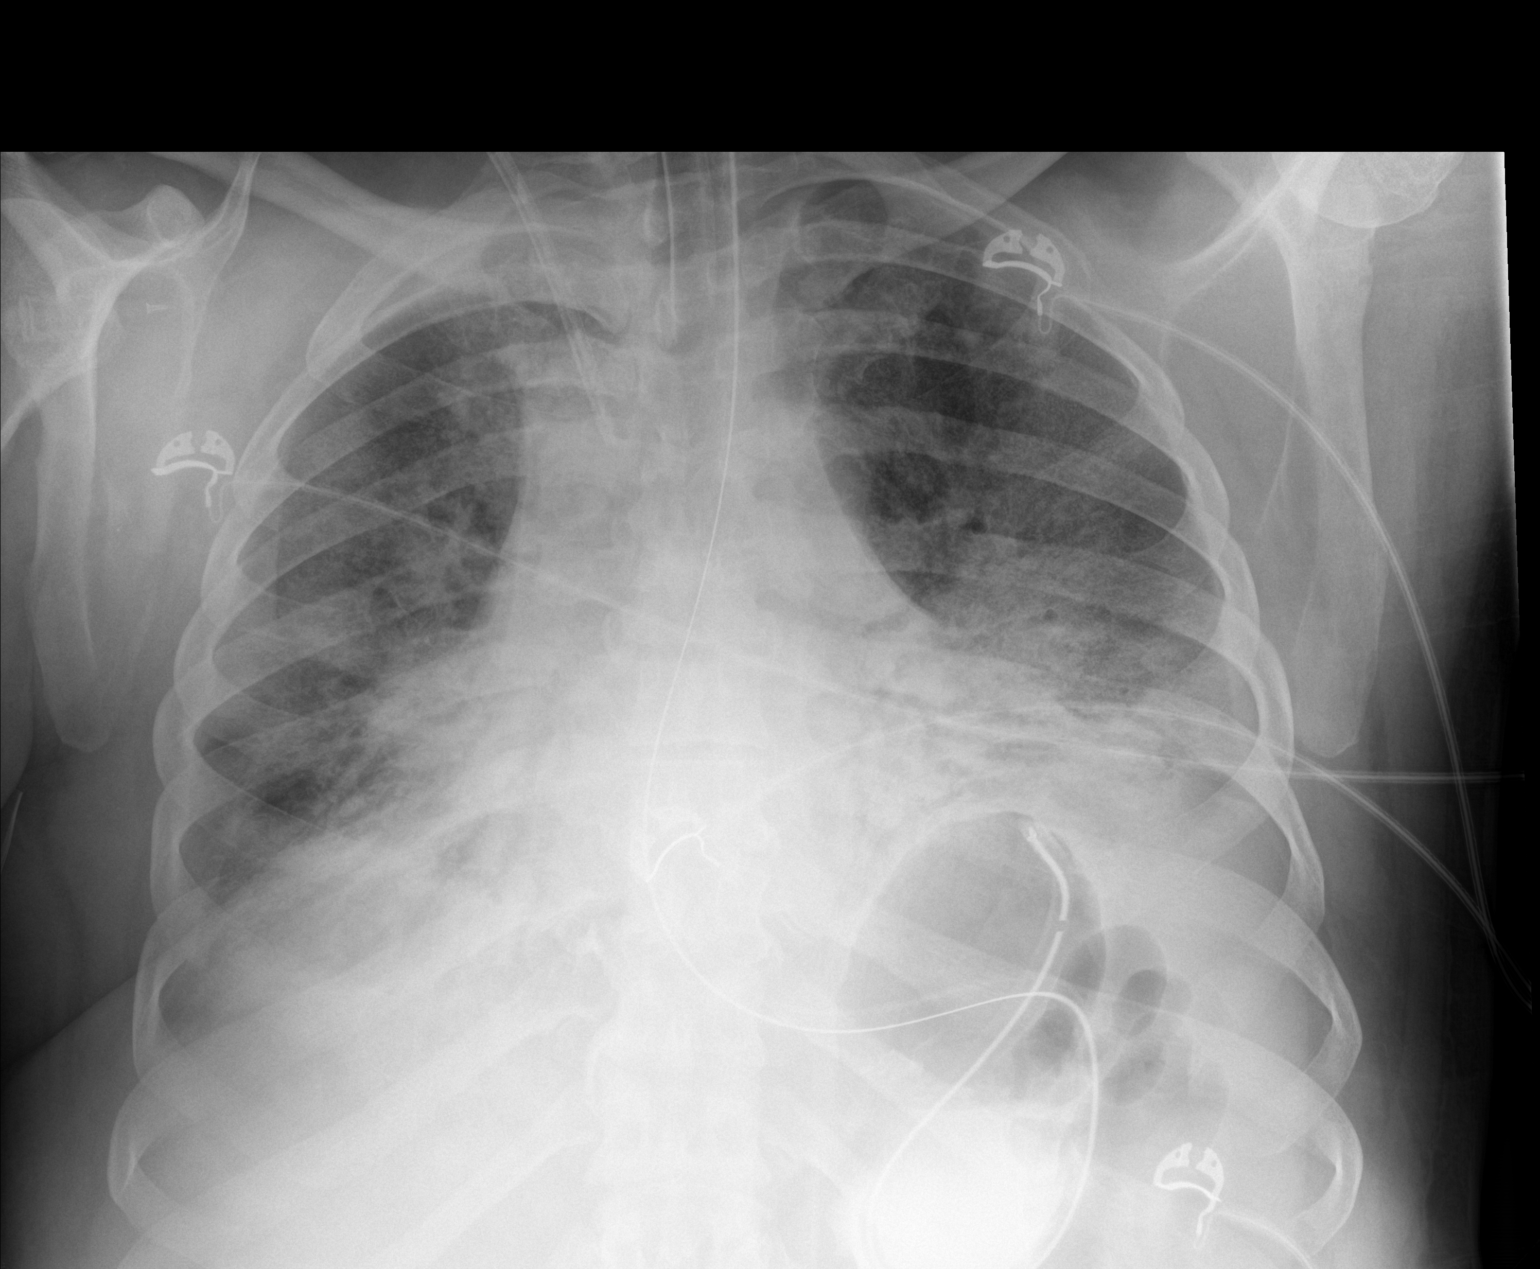

[1 of 1 positions shown; findings below may reference images not displayed]

FINDINGS: Endotracheal tube and gastric catheter are noted in satisfactory
position. Right jugular temporary dialysis catheter is seen. Patchy
infiltrates are seen throughout both lungs predominately within the
bases increased in the interval from the prior exam. These are
consistent with the patient's given clinical history of [id number]
positivity.
IMPRESSION: Increasing bibasilar infiltrates consistent with the given clinical
history.

Tubes and lines as described in satisfactory position.

## 2020-06-04 IMAGING — DX DG CHEST 1V PORT
1 series · 1 of 1 positions shown · non-contrast
Comparison: Radiograph 07/19/2019

CLINICAL DATA: Respiratory failure

EXAM:
PORTABLE CHEST 1 VIEW

[chest ap]
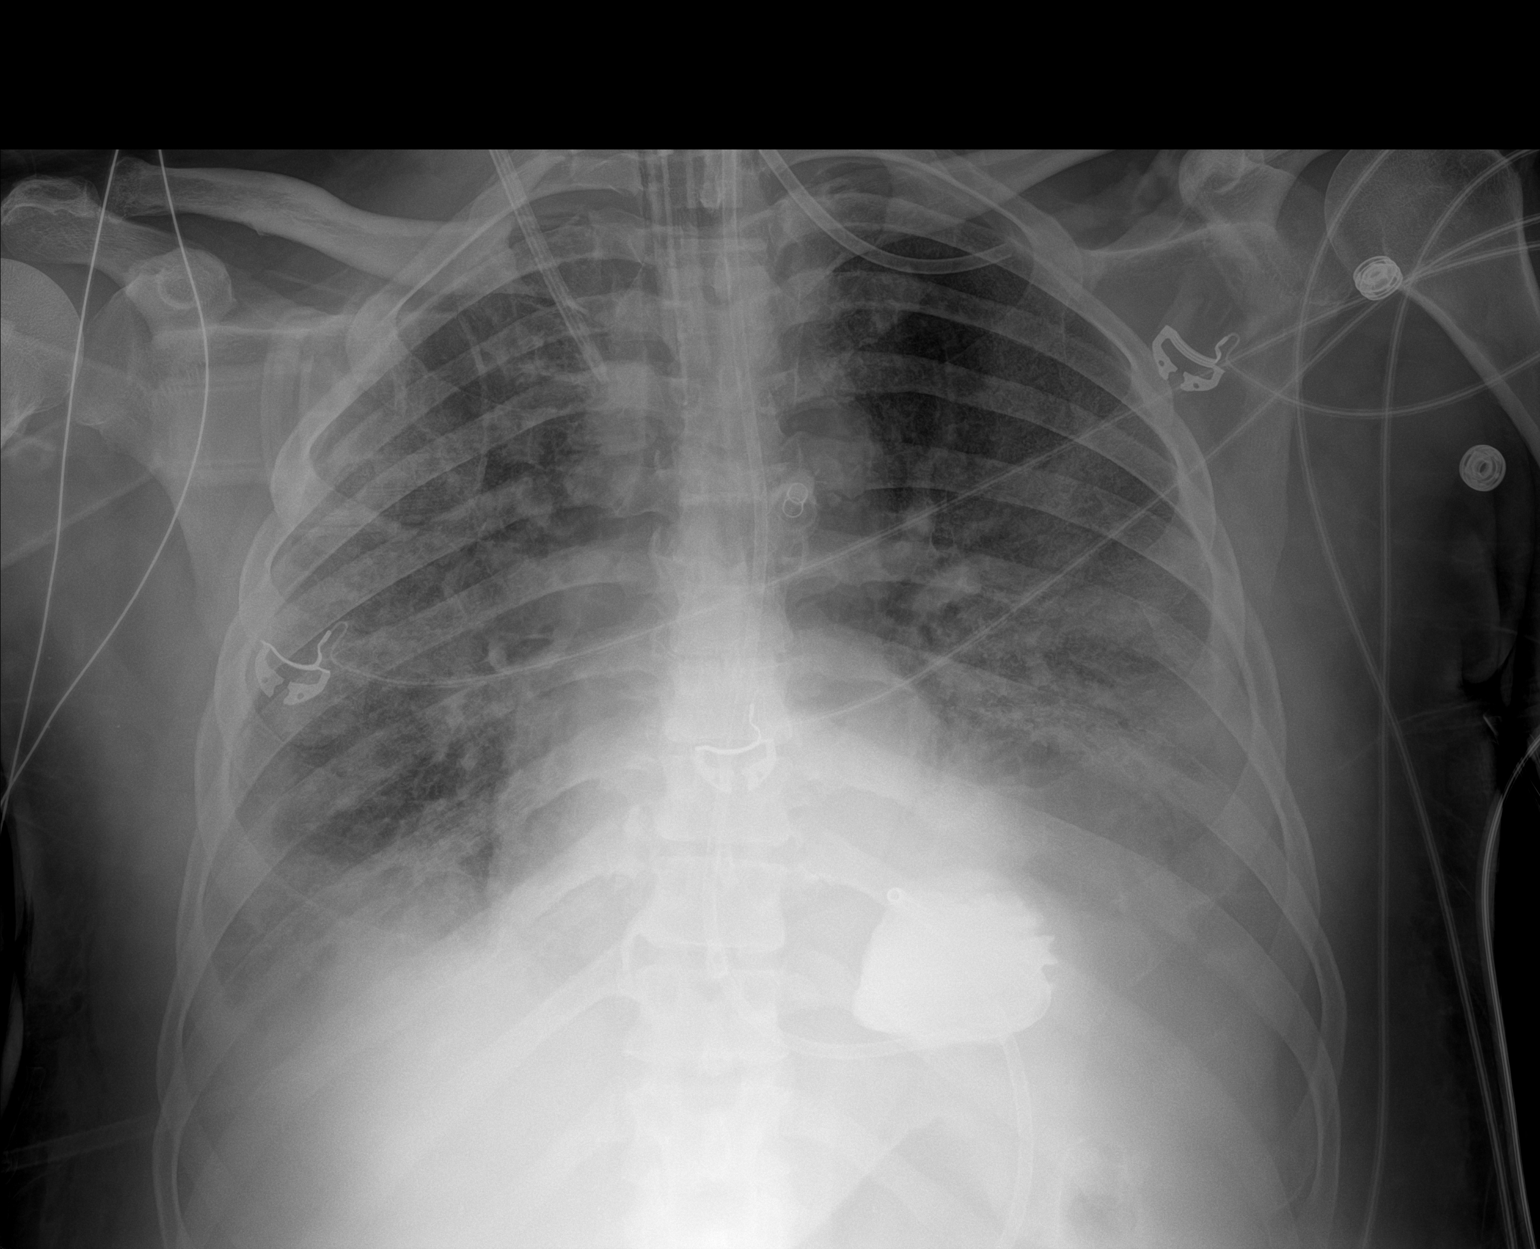

[1 of 1 positions shown; findings below may reference images not displayed]

FINDINGS: Endotracheal tube terminates in the mid trachea, 3.8 cm from the
carina. Transesophageal feeding tube curls in the upper stomach
terminating below the level of imaging. Small amount of retained
contrast media is noted in the gastric fundus. Right IJ catheter
sheath terminates in the mid SVC.

There are persistent bilateral areas of airspace disease with some
increasing lung inflation compared to prior study. Bilateral
effusions are again noted. No visible pneumothorax.
Cardiomediastinal contours are stable. No acute osseous or soft
tissue abnormality.
IMPRESSION: 1. No significant interval change in bilateral airspace disease and
bilateral pleural effusions accounting for improved inflation.
2. Stable satisfactory positioning of the support lines and tubes.

## 2020-06-06 IMAGING — DX DG CHEST 1V
1 series · 1 of 1 positions shown · non-contrast
Comparison: Chest x-rays dated 07/20/2019 and 07/19/2019.

CLINICAL DATA: ARDS. [id number] positive.

EXAM:
CHEST  1 VIEW

[chest]
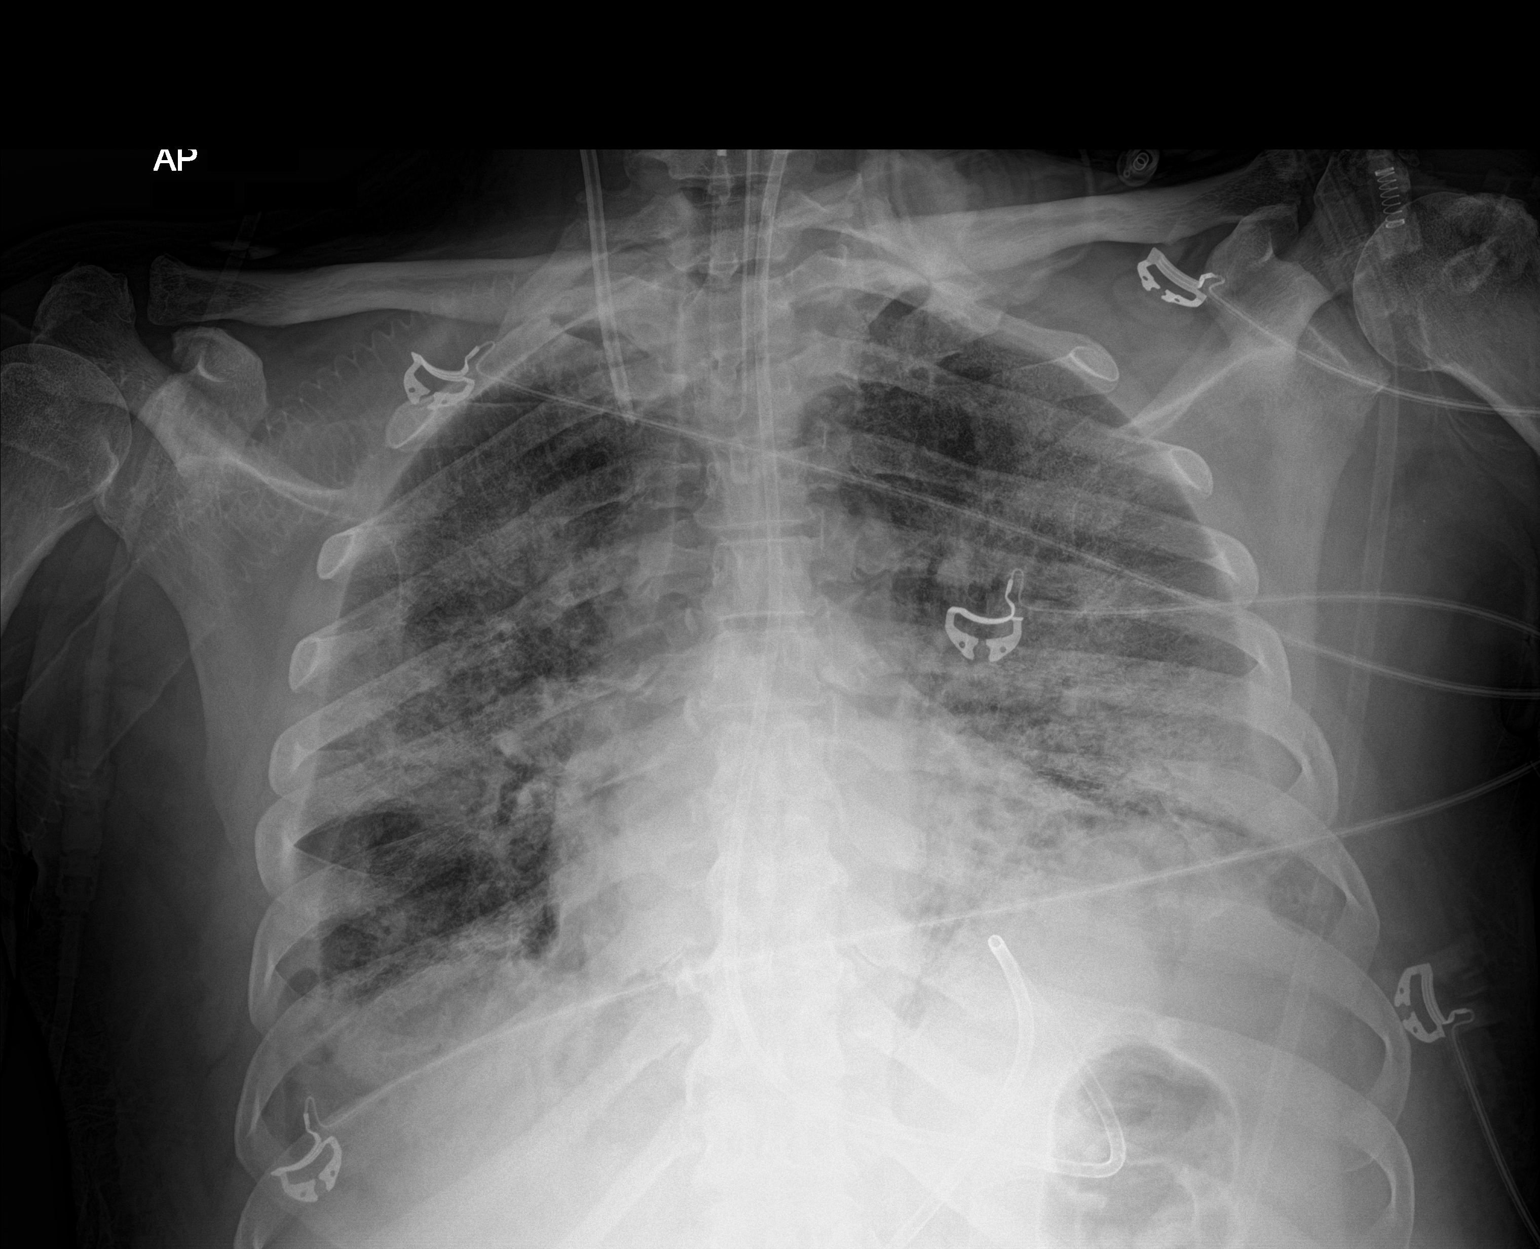

[1 of 1 positions shown; findings below may reference images not displayed]

FINDINGS: Support apparatus appears stable in position. Heart size and
mediastinal contours appear stable. Again noted are bilateral
diffuse airspace opacities. Probable small bilateral pleural
effusions. No pneumothorax is seen. Osseous structures about the
chest are unremarkable.
IMPRESSION: 1. Stable diffuse bilateral airspace opacities, compatible with the
given history of multifocal pneumonia and/or ARDS.
2. Probable small bilateral pleural effusions.
3. Stable appearance of the support apparatus.
